# Patient Record
Sex: Female | Born: 1955 | Race: White | Hispanic: No | Smoking: Current every day smoker
Health system: Southern US, Community
[De-identification: ages and names within clinical notes are randomized; demographics above are authoritative.]

## PROBLEM LIST (undated history)

## (undated) DIAGNOSIS — F32A Depression, unspecified: Secondary | ICD-10-CM

## (undated) DIAGNOSIS — D696 Thrombocytopenia, unspecified: Secondary | ICD-10-CM

## (undated) DIAGNOSIS — R519 Headache, unspecified: Secondary | ICD-10-CM

## (undated) DIAGNOSIS — F259 Schizoaffective disorder, unspecified: Secondary | ICD-10-CM

## (undated) DIAGNOSIS — F319 Bipolar disorder, unspecified: Secondary | ICD-10-CM

## (undated) DIAGNOSIS — M1990 Unspecified osteoarthritis, unspecified site: Secondary | ICD-10-CM

## (undated) DIAGNOSIS — J449 Chronic obstructive pulmonary disease, unspecified: Secondary | ICD-10-CM

## (undated) DIAGNOSIS — Z780 Asymptomatic menopausal state: Secondary | ICD-10-CM

## (undated) DIAGNOSIS — K219 Gastro-esophageal reflux disease without esophagitis: Secondary | ICD-10-CM

## (undated) DIAGNOSIS — F419 Anxiety disorder, unspecified: Secondary | ICD-10-CM

## (undated) DIAGNOSIS — B1920 Unspecified viral hepatitis C without hepatic coma: Secondary | ICD-10-CM

## (undated) DIAGNOSIS — I10 Essential (primary) hypertension: Secondary | ICD-10-CM

## (undated) DIAGNOSIS — F329 Major depressive disorder, single episode, unspecified: Secondary | ICD-10-CM

## (undated) DIAGNOSIS — A419 Sepsis, unspecified organism: Secondary | ICD-10-CM

## (undated) HISTORY — DX: Unspecified viral hepatitis C without hepatic coma: B19.20

## (undated) HISTORY — DX: Asymptomatic menopausal state: Z78.0

## (undated) HISTORY — DX: Anxiety disorder, unspecified: F41.9

## (undated) HISTORY — DX: Major depressive disorder, single episode, unspecified: F32.9

## (undated) HISTORY — DX: Gastro-esophageal reflux disease without esophagitis: K21.9

## (undated) HISTORY — DX: Depression, unspecified: F32.A

---

## 2005-04-25 ENCOUNTER — Emergency Department: Payer: Self-pay | Admitting: Emergency Medicine

## 2005-08-14 ENCOUNTER — Emergency Department: Payer: Self-pay | Admitting: General Practice

## 2005-12-01 ENCOUNTER — Emergency Department: Payer: Self-pay | Admitting: Emergency Medicine

## 2007-08-28 ENCOUNTER — Inpatient Hospital Stay: Payer: Self-pay | Admitting: Internal Medicine

## 2008-11-19 HISTORY — PX: CATARACT EXTRACTION: SUR2

## 2009-09-08 ENCOUNTER — Emergency Department: Payer: Self-pay | Admitting: Unknown Physician Specialty

## 2009-12-19 ENCOUNTER — Inpatient Hospital Stay: Payer: Self-pay | Admitting: Unknown Physician Specialty

## 2010-04-03 ENCOUNTER — Ambulatory Visit (HOSPITAL_COMMUNITY): Admission: RE | Admit: 2010-04-03 | Discharge: 2010-04-03 | Payer: Self-pay | Admitting: Ophthalmology

## 2010-05-05 ENCOUNTER — Inpatient Hospital Stay: Payer: Self-pay | Admitting: Psychiatry

## 2010-07-24 ENCOUNTER — Emergency Department: Payer: Self-pay | Admitting: Emergency Medicine

## 2011-02-06 LAB — BASIC METABOLIC PANEL
CO2: 26 mEq/L (ref 19–32)
Calcium: 9.6 mg/dL (ref 8.4–10.5)
Chloride: 109 mEq/L (ref 96–112)
Creatinine, Ser: 0.76 mg/dL (ref 0.4–1.2)
Glucose, Bld: 113 mg/dL — ABNORMAL HIGH (ref 70–99)
Sodium: 142 mEq/L (ref 135–145)

## 2011-07-10 ENCOUNTER — Emergency Department: Payer: Self-pay | Admitting: Emergency Medicine

## 2012-10-27 ENCOUNTER — Emergency Department: Payer: Self-pay | Admitting: Emergency Medicine

## 2012-12-30 ENCOUNTER — Encounter: Payer: Self-pay | Admitting: Nurse Practitioner

## 2013-01-17 ENCOUNTER — Encounter: Payer: Self-pay | Admitting: Nurse Practitioner

## 2013-07-14 ENCOUNTER — Ambulatory Visit: Payer: Self-pay | Admitting: Cardiovascular Disease

## 2013-07-14 LAB — CBC WITH DIFFERENTIAL/PLATELET
Basophil #: 0.2 10*3/uL — ABNORMAL HIGH (ref 0.0–0.1)
Basophil %: 1.4 %
Eosinophil %: 1.6 %
HCT: 41.9 % (ref 35.0–47.0)
Lymphocyte #: 4 10*3/uL — ABNORMAL HIGH (ref 1.0–3.6)
Lymphocyte %: 29.2 %
MCHC: 34.5 g/dL (ref 32.0–36.0)
MCV: 95 fL (ref 80–100)
Monocyte #: 1.1 x10 3/mm — ABNORMAL HIGH (ref 0.2–0.9)
Neutrophil %: 59.6 %
RBC: 4.4 10*6/uL (ref 3.80–5.20)
RDW: 13.2 % (ref 11.5–14.5)
WBC: 13.7 10*3/uL — ABNORMAL HIGH (ref 3.6–11.0)

## 2013-07-15 ENCOUNTER — Ambulatory Visit: Payer: Self-pay | Admitting: Cardiovascular Disease

## 2013-07-16 ENCOUNTER — Inpatient Hospital Stay: Payer: Self-pay | Admitting: Psychiatry

## 2013-07-16 LAB — COMPREHENSIVE METABOLIC PANEL
Albumin: 3.3 g/dL — ABNORMAL LOW (ref 3.4–5.0)
Alkaline Phosphatase: 72 U/L (ref 50–136)
Anion Gap: 9 (ref 7–16)
BUN: 8 mg/dL (ref 7–18)
Calcium, Total: 7.9 mg/dL — ABNORMAL LOW (ref 8.5–10.1)
Chloride: 108 mmol/L — ABNORMAL HIGH (ref 98–107)
Glucose: 96 mg/dL (ref 65–99)
Osmolality: 272 (ref 275–301)
Potassium: 3.3 mmol/L — ABNORMAL LOW (ref 3.5–5.1)
SGOT(AST): 19 U/L (ref 15–37)
SGPT (ALT): 17 U/L (ref 12–78)
Sodium: 137 mmol/L (ref 136–145)
Total Protein: 6.1 g/dL — ABNORMAL LOW (ref 6.4–8.2)

## 2013-07-16 LAB — URINALYSIS, COMPLETE
Bacteria: NONE SEEN
Bilirubin,UR: NEGATIVE
Bilirubin,UR: NEGATIVE
Blood: NEGATIVE
Blood: NEGATIVE
Glucose,UR: NEGATIVE mg/dL (ref 0–75)
Leukocyte Esterase: NEGATIVE
Nitrite: NEGATIVE
Ph: 6 (ref 4.5–8.0)
Ph: 6 (ref 4.5–8.0)
Protein: NEGATIVE
RBC,UR: <1 /HPF (ref 0–5)
RBC,UR: 12 /HPF (ref 0–5)
Specific Gravity: 1.009 (ref 1.003–1.030)
Specific Gravity: 1.016 (ref 1.003–1.030)
Squamous Epithelial: 1
Squamous Epithelial: 6
WBC UR: 2 /HPF (ref 0–5)
WBC UR: 37 /HPF (ref 0–5)

## 2013-07-16 LAB — CBC WITH DIFFERENTIAL/PLATELET
Basophil %: 0.6 %
Eosinophil #: 0.2 10*3/uL (ref 0.0–0.7)
Eosinophil %: 1.2 %
HCT: 37.3 % (ref 35.0–47.0)
HGB: 12.8 g/dL (ref 12.0–16.0)
Lymphocyte #: 3.1 10*3/uL (ref 1.0–3.6)
Lymphocyte %: 23.7 %
MCV: 96 fL (ref 80–100)
Monocyte #: 0.9 x10 3/mm (ref 0.2–0.9)
Neutrophil %: 67.5 %
Platelet: 193 10*3/uL (ref 150–440)
RDW: 13.2 % (ref 11.5–14.5)

## 2013-07-16 LAB — DRUG SCREEN, URINE
Barbiturates, Ur Screen: NEGATIVE (ref ?–200)
Cannabinoid 50 Ng, Ur ~~LOC~~: NEGATIVE (ref ?–50)
Opiate, Ur Screen: NEGATIVE (ref ?–300)
Phencyclidine (PCP) Ur S: NEGATIVE (ref ?–25)

## 2013-07-16 LAB — CK TOTAL AND CKMB (NOT AT ARMC)
CK, Total: 55 U/L (ref 21–215)
CK-MB: 0.8 ng/mL (ref 0.5–3.6)

## 2013-07-16 LAB — TROPONIN I
Troponin-I: <0.02 ng/mL
Troponin-I: <0.02 ng/mL

## 2013-07-16 LAB — ETHANOL: Ethanol %: 0.23 % — ABNORMAL HIGH (ref 0.000–0.080)

## 2013-07-16 LAB — PROTIME-INR
INR: 1
Prothrombin Time: 13.1 secs (ref 11.5–14.7)

## 2013-07-24 ENCOUNTER — Emergency Department: Payer: Self-pay | Admitting: Emergency Medicine

## 2013-07-24 LAB — DRUG SCREEN, URINE
Amphetamines, Ur Screen: NEGATIVE (ref ?–1000)
Barbiturates, Ur Screen: NEGATIVE (ref ?–200)
Benzodiazepine, Ur Scrn: POSITIVE (ref ?–200)
Cannabinoid 50 Ng, Ur ~~LOC~~: NEGATIVE (ref ?–50)
MDMA (Ecstasy)Ur Screen: NEGATIVE (ref ?–500)
Methadone, Ur Screen: NEGATIVE (ref ?–300)
Phencyclidine (PCP) Ur S: NEGATIVE (ref ?–25)
Tricyclic, Ur Screen: NEGATIVE (ref ?–1000)

## 2013-07-24 LAB — CBC
HCT: 44 % (ref 35.0–47.0)
HGB: 15.3 g/dL (ref 12.0–16.0)
MCHC: 34.8 g/dL (ref 32.0–36.0)
MCV: 95 fL (ref 80–100)
WBC: 10.7 10*3/uL (ref 3.6–11.0)

## 2013-07-24 LAB — COMPREHENSIVE METABOLIC PANEL
Alkaline Phosphatase: 87 U/L (ref 50–136)
BUN: 7 mg/dL (ref 7–18)
Bilirubin,Total: 0.2 mg/dL (ref 0.2–1.0)
Calcium, Total: 8.7 mg/dL (ref 8.5–10.1)
Potassium: 3.7 mmol/L (ref 3.5–5.1)
SGOT(AST): 19 U/L (ref 15–37)
Sodium: 140 mmol/L (ref 136–145)
Total Protein: 7.5 g/dL (ref 6.4–8.2)

## 2013-07-24 LAB — ETHANOL: Ethanol: 282 mg/dL

## 2013-07-24 LAB — ACETAMINOPHEN LEVEL: Acetaminophen: <2 ug/mL

## 2013-08-31 ENCOUNTER — Ambulatory Visit: Payer: Self-pay | Admitting: Vascular Surgery

## 2013-08-31 HISTORY — PX: LOWER EXTREMITY ANGIOGRAPHY: CATH118251

## 2013-08-31 LAB — PROTIME-INR: Prothrombin Time: 28.9 secs — ABNORMAL HIGH (ref 11.5–14.7)

## 2013-08-31 LAB — BASIC METABOLIC PANEL
BUN: 11 mg/dL (ref 7–18)
Calcium, Total: 9.1 mg/dL (ref 8.5–10.1)
Chloride: 110 mmol/L — ABNORMAL HIGH (ref 98–107)
EGFR (African American): >60
EGFR (Non-African Amer.): >60
Glucose: 90 mg/dL (ref 65–99)
Osmolality: 280 (ref 275–301)
Potassium: 3.9 mmol/L (ref 3.5–5.1)
Sodium: 141 mmol/L (ref 136–145)

## 2013-10-26 LAB — COMPREHENSIVE METABOLIC PANEL
Albumin: 3.8 g/dL (ref 3.4–5.0)
BUN: 11 mg/dL (ref 7–18)
Bilirubin,Total: 0.3 mg/dL (ref 0.2–1.0)
Calcium, Total: 8.7 mg/dL (ref 8.5–10.1)
Chloride: 105 mmol/L (ref 98–107)
Creatinine: 0.62 mg/dL (ref 0.60–1.30)
EGFR (African American): >60
EGFR (Non-African Amer.): >60
Potassium: 3.4 mmol/L — ABNORMAL LOW (ref 3.5–5.1)
SGPT (ALT): 33 U/L (ref 12–78)
Sodium: 135 mmol/L — ABNORMAL LOW (ref 136–145)
Total Protein: 7.5 g/dL (ref 6.4–8.2)

## 2013-10-26 LAB — DRUG SCREEN, URINE
Amphetamines, Ur Screen: NEGATIVE (ref ?–1000)
Benzodiazepine, Ur Scrn: POSITIVE (ref ?–200)
Cocaine Metabolite,Ur ~~LOC~~: NEGATIVE (ref ?–300)
MDMA (Ecstasy)Ur Screen: NEGATIVE (ref ?–500)
Opiate, Ur Screen: NEGATIVE (ref ?–300)
Tricyclic, Ur Screen: NEGATIVE (ref ?–1000)

## 2013-10-26 LAB — URINALYSIS, COMPLETE
Glucose,UR: NEGATIVE mg/dL (ref 0–75)
Ketone: NEGATIVE
Leukocyte Esterase: NEGATIVE
Ph: 5 (ref 4.5–8.0)
RBC,UR: 3 /HPF (ref 0–5)
Specific Gravity: 1.004 (ref 1.003–1.030)
Squamous Epithelial: 1

## 2013-10-26 LAB — CBC
HCT: 39.1 % (ref 35.0–47.0)
HGB: 13.2 g/dL (ref 12.0–16.0)
MCH: 31.8 pg (ref 26.0–34.0)
MCV: 94 fL (ref 80–100)
Platelet: 211 10*3/uL (ref 150–440)
RDW: 13.8 % (ref 11.5–14.5)

## 2013-10-26 LAB — SALICYLATE LEVEL: Salicylates, Serum: 3.3 mg/dL — ABNORMAL HIGH

## 2013-10-26 LAB — ETHANOL: Ethanol %: <0.003 % (ref 0.000–0.080)

## 2013-10-26 LAB — ACETAMINOPHEN LEVEL: Acetaminophen: <2 ug/mL

## 2013-10-27 ENCOUNTER — Inpatient Hospital Stay: Payer: Self-pay | Admitting: Psychiatry

## 2013-10-27 ENCOUNTER — Inpatient Hospital Stay: Payer: Self-pay | Admitting: Internal Medicine

## 2013-10-27 LAB — PROTIME-INR
INR: >15.1
Prothrombin Time: >120 secs (ref 11.5–14.7)

## 2013-10-28 LAB — CBC WITH DIFFERENTIAL/PLATELET
Basophil %: 0.7 %
Eosinophil #: 0.1 10*3/uL (ref 0.0–0.7)
Eosinophil %: 0.5 %
HCT: 27.2 % — ABNORMAL LOW (ref 35.0–47.0)
HGB: 9.2 g/dL — ABNORMAL LOW (ref 12.0–16.0)
Lymphocyte #: 1.3 10*3/uL (ref 1.0–3.6)
MCH: 32 pg (ref 26.0–34.0)
MCHC: 33.7 g/dL (ref 32.0–36.0)
Monocyte #: 0.8 x10 3/mm (ref 0.2–0.9)
Monocyte %: 5.7 %
Neutrophil #: 11.7 10*3/uL — ABNORMAL HIGH (ref 1.4–6.5)
Neutrophil %: 83.9 %
RBC: 2.86 10*6/uL — ABNORMAL LOW (ref 3.80–5.20)
RDW: 14.3 % (ref 11.5–14.5)
WBC: 14 10*3/uL — ABNORMAL HIGH (ref 3.6–11.0)

## 2013-10-28 LAB — BASIC METABOLIC PANEL
Anion Gap: 4 — ABNORMAL LOW (ref 7–16)
BUN: 7 mg/dL (ref 7–18)
Calcium, Total: 8.1 mg/dL — ABNORMAL LOW (ref 8.5–10.1)
Chloride: 107 mmol/L (ref 98–107)
Co2: 27 mmol/L (ref 21–32)
Creatinine: 0.5 mg/dL — ABNORMAL LOW (ref 0.60–1.30)
EGFR (African American): >60
EGFR (Non-African Amer.): >60
Glucose: 104 mg/dL — ABNORMAL HIGH (ref 65–99)
Osmolality: 274 (ref 275–301)
Potassium: 3.8 mmol/L (ref 3.5–5.1)
Sodium: 138 mmol/L (ref 136–145)

## 2013-10-28 LAB — URINALYSIS, COMPLETE
Bilirubin,UR: NEGATIVE
Hyaline Cast: 2
Nitrite: NEGATIVE
Protein: 30
Squamous Epithelial: 1
WBC UR: 11 /HPF (ref 0–5)

## 2013-10-28 LAB — MAGNESIUM: Magnesium: 1.2 mg/dL — ABNORMAL LOW

## 2013-10-28 LAB — PROTIME-INR
INR: >15.1
Prothrombin Time: >120 secs (ref 11.5–14.7)

## 2013-10-28 LAB — PRO B NATRIURETIC PEPTIDE: B-Type Natriuretic Peptide: 3001 pg/mL — ABNORMAL HIGH (ref 0–125)

## 2013-10-29 LAB — URINALYSIS, COMPLETE
Bacteria: NONE SEEN
Glucose,UR: NEGATIVE mg/dL (ref 0–75)
Leukocyte Esterase: NEGATIVE
RBC,UR: 1 /HPF (ref 0–5)
WBC UR: <1 /HPF (ref 0–5)

## 2013-10-29 LAB — PROTIME-INR
INR: 3.3
Prothrombin Time: 32.3 secs — ABNORMAL HIGH (ref 11.5–14.7)

## 2013-10-30 LAB — CBC WITH DIFFERENTIAL/PLATELET
Basophil #: 0 10*3/uL (ref 0.0–0.1)
Basophil %: 0.2 %
Eosinophil #: 0 10*3/uL (ref 0.0–0.7)
HCT: 26.6 % — ABNORMAL LOW (ref 35.0–47.0)
Lymphocyte #: 0.6 10*3/uL — ABNORMAL LOW (ref 1.0–3.6)
MCH: 31.4 pg (ref 26.0–34.0)
MCHC: 33.8 g/dL (ref 32.0–36.0)
Monocyte #: 0.9 x10 3/mm (ref 0.2–0.9)
Monocyte %: 6.1 %
Neutrophil #: 13.7 10*3/uL — ABNORMAL HIGH (ref 1.4–6.5)
Neutrophil %: 89.5 %
Platelet: 169 10*3/uL (ref 150–440)
RDW: 14 % (ref 11.5–14.5)
WBC: 15.3 10*3/uL — ABNORMAL HIGH (ref 3.6–11.0)

## 2013-10-30 LAB — BASIC METABOLIC PANEL
Calcium, Total: 8.7 mg/dL (ref 8.5–10.1)
Co2: 32 mmol/L (ref 21–32)
Creatinine: 0.55 mg/dL — ABNORMAL LOW (ref 0.60–1.30)
EGFR (African American): >60
Glucose: 126 mg/dL — ABNORMAL HIGH (ref 65–99)
Osmolality: 278 (ref 275–301)
Potassium: 2.3 mmol/L — CL (ref 3.5–5.1)
Sodium: 140 mmol/L (ref 136–145)

## 2013-10-30 LAB — POTASSIUM: Potassium: 2.7 mmol/L — ABNORMAL LOW (ref 3.5–5.1)

## 2013-10-30 LAB — MAGNESIUM: Magnesium: 1.5 mg/dL — ABNORMAL LOW

## 2013-10-31 LAB — BASIC METABOLIC PANEL
Anion Gap: 4 — ABNORMAL LOW (ref 7–16)
BUN: 10 mg/dL (ref 7–18)
Calcium, Total: 8.6 mg/dL (ref 8.5–10.1)
Chloride: 91 mmol/L — ABNORMAL LOW (ref 98–107)
Co2: 37 mmol/L — ABNORMAL HIGH (ref 21–32)
Creatinine: 0.57 mg/dL — ABNORMAL LOW (ref 0.60–1.30)
Glucose: 151 mg/dL — ABNORMAL HIGH (ref 65–99)
Sodium: 132 mmol/L — ABNORMAL LOW (ref 136–145)

## 2013-10-31 LAB — MAGNESIUM: Magnesium: 1.6 mg/dL — ABNORMAL LOW

## 2013-11-01 LAB — BASIC METABOLIC PANEL
Anion Gap: 7 (ref 7–16)
BUN: 16 mg/dL (ref 7–18)
Calcium, Total: 8.9 mg/dL (ref 8.5–10.1)
Co2: 35 mmol/L — ABNORMAL HIGH (ref 21–32)
Creatinine: 0.59 mg/dL — ABNORMAL LOW (ref 0.60–1.30)
EGFR (African American): >60
EGFR (Non-African Amer.): >60
Osmolality: 268 (ref 275–301)
Potassium: 2.3 mmol/L — CL (ref 3.5–5.1)

## 2013-11-01 LAB — POTASSIUM: Potassium: 3 mmol/L — ABNORMAL LOW (ref 3.5–5.1)

## 2013-11-01 LAB — MAGNESIUM: Magnesium: 2.1 mg/dL

## 2013-11-02 LAB — BASIC METABOLIC PANEL
Anion Gap: 5 — ABNORMAL LOW (ref 7–16)
BUN: 23 mg/dL — ABNORMAL HIGH (ref 7–18)
Co2: 36 mmol/L — ABNORMAL HIGH (ref 21–32)
Creatinine: 0.49 mg/dL — ABNORMAL LOW (ref 0.60–1.30)
EGFR (African American): >60
EGFR (Non-African Amer.): >60
Osmolality: 289 (ref 275–301)

## 2013-11-02 LAB — CBC WITH DIFFERENTIAL/PLATELET
Basophil #: 0 10*3/uL (ref 0.0–0.1)
HCT: 30.5 % — ABNORMAL LOW (ref 35.0–47.0)
Lymphocyte %: 2.9 %
MCHC: 33.2 g/dL (ref 32.0–36.0)
MCV: 94 fL (ref 80–100)
Monocyte #: 1.1 x10 3/mm — ABNORMAL HIGH (ref 0.2–0.9)
Monocyte %: 7.6 %
Neutrophil #: 13.2 10*3/uL — ABNORMAL HIGH (ref 1.4–6.5)
Neutrophil %: 89.2 %
Platelet: 284 10*3/uL (ref 150–440)
RBC: 3.26 10*6/uL — ABNORMAL LOW (ref 3.80–5.20)
RDW: 14.6 % — ABNORMAL HIGH (ref 11.5–14.5)

## 2013-11-02 LAB — MAGNESIUM: Magnesium: 2.3 mg/dL

## 2013-11-03 LAB — BASIC METABOLIC PANEL
Co2: 37 mmol/L — ABNORMAL HIGH (ref 21–32)
Creatinine: 0.51 mg/dL — ABNORMAL LOW (ref 0.60–1.30)
Potassium: 2.6 mmol/L — ABNORMAL LOW (ref 3.5–5.1)

## 2013-11-03 LAB — POTASSIUM: Potassium: 2.7 mmol/L — ABNORMAL LOW (ref 3.5–5.1)

## 2013-11-04 ENCOUNTER — Ambulatory Visit (HOSPITAL_COMMUNITY)
Admission: AD | Admit: 2013-11-04 | Discharge: 2013-11-04 | Disposition: A | Discharge status: Home / Self Care | Payer: Medicare Other | Source: Other Acute Inpatient Hospital | Attending: Internal Medicine | Admitting: Internal Medicine

## 2013-11-04 ENCOUNTER — Inpatient Hospital Stay
Admission: EM | Admit: 2013-11-04 | Discharge: 2013-11-13 | Disposition: A | Discharge status: Home Health | Payer: Medicare Other | Source: Other Acute Inpatient Hospital | Attending: Internal Medicine | Admitting: Internal Medicine

## 2013-11-04 DIAGNOSIS — J9600 Acute respiratory failure, unspecified whether with hypoxia or hypercapnia: Secondary | ICD-10-CM | POA: Insufficient documentation

## 2013-11-04 DIAGNOSIS — R45851 Suicidal ideations: Secondary | ICD-10-CM | POA: Insufficient documentation

## 2013-11-04 LAB — BLOOD GAS, ARTERIAL
Bicarbonate: 31.9 mEq/L — ABNORMAL HIGH (ref 20.0–24.0)
Patient temperature: 98.7
TCO2: 33.1 mmol/L (ref 0–100)
pCO2 arterial: 38.7 mmHg (ref 35.0–45.0)
pH, Arterial: 7.527 — ABNORMAL HIGH (ref 7.350–7.450)

## 2013-11-04 LAB — BASIC METABOLIC PANEL
Anion Gap: 5 — ABNORMAL LOW (ref 7–16)
BUN: 23 mg/dL — ABNORMAL HIGH (ref 7–18)
Calcium, Total: 9 mg/dL (ref 8.5–10.1)
Chloride: 94 mmol/L — ABNORMAL LOW (ref 98–107)
Co2: 37 mmol/L — ABNORMAL HIGH (ref 21–32)
Glucose: 175 mg/dL — ABNORMAL HIGH (ref 65–99)
Osmolality: 280 (ref 275–301)

## 2013-11-04 LAB — CBC WITH DIFFERENTIAL/PLATELET
Basophil #: 0 10*3/uL (ref 0.0–0.1)
Eosinophil %: 0 %
Lymphocyte #: 0.8 10*3/uL — ABNORMAL LOW (ref 1.0–3.6)
MCHC: 33.4 g/dL (ref 32.0–36.0)
MCV: 92 fL (ref 80–100)
Monocyte %: 6.3 %
Neutrophil %: 87.7 %
RBC: 3.1 10*6/uL — ABNORMAL LOW (ref 3.80–5.20)
RDW: 14.7 % — ABNORMAL HIGH (ref 11.5–14.5)
WBC: 13 10*3/uL — ABNORMAL HIGH (ref 3.6–11.0)

## 2013-11-05 ENCOUNTER — Other Ambulatory Visit (HOSPITAL_COMMUNITY): Payer: Medicare Other

## 2013-11-05 LAB — CBC WITH DIFFERENTIAL/PLATELET
Basophils Absolute: 0 10*3/uL (ref 0.0–0.1)
Eosinophils Relative: 0 % (ref 0–5)
HCT: 30.2 % — ABNORMAL LOW (ref 36.0–46.0)
Hemoglobin: 10.1 g/dL — ABNORMAL LOW (ref 12.0–15.0)
Lymphocytes Relative: 5 % — ABNORMAL LOW (ref 12–46)
MCV: 92.9 fL (ref 78.0–100.0)
Monocytes Absolute: 1.1 10*3/uL — ABNORMAL HIGH (ref 0.1–1.0)
Monocytes Relative: 8 % (ref 3–12)
RDW: 15 % (ref 11.5–15.5)
WBC: 13.8 10*3/uL — ABNORMAL HIGH (ref 4.0–10.5)

## 2013-11-05 LAB — COMPREHENSIVE METABOLIC PANEL
BUN: 21 mg/dL (ref 6–23)
CO2: 32 mEq/L (ref 19–32)
Calcium: 8.6 mg/dL (ref 8.4–10.5)
Chloride: 97 mEq/L (ref 96–112)
Creatinine, Ser: 0.46 mg/dL — ABNORMAL LOW (ref 0.50–1.10)
GFR calc Af Amer: >90 mL/min (ref >90)
GFR calc non Af Amer: >90 mL/min (ref >90)
Glucose, Bld: 162 mg/dL — ABNORMAL HIGH (ref 70–99)
Total Bilirubin: 0.7 mg/dL (ref 0.3–1.2)

## 2013-11-05 LAB — MAGNESIUM: Magnesium: 2.1 mg/dL (ref 1.5–2.5)

## 2013-11-05 LAB — PHOSPHORUS: Phosphorus: 3.6 mg/dL (ref 2.3–4.6)

## 2013-11-05 LAB — PREALBUMIN: Prealbumin: 27.8 mg/dL (ref 17.0–34.0)

## 2013-11-07 ENCOUNTER — Other Ambulatory Visit (HOSPITAL_COMMUNITY): Payer: Medicare Other

## 2013-11-07 LAB — URINALYSIS, ROUTINE W REFLEX MICROSCOPIC
Glucose, UA: NEGATIVE mg/dL
Ketones, ur: NEGATIVE mg/dL
Leukocytes, UA: NEGATIVE
Protein, ur: NEGATIVE mg/dL
Specific Gravity, Urine: 1.021 (ref 1.005–1.030)
Urobilinogen, UA: 1 mg/dL (ref 0.0–1.0)

## 2013-11-07 LAB — CBC
HCT: 30.6 % — ABNORMAL LOW (ref 36.0–46.0)
Platelets: 385 10*3/uL (ref 150–400)
RBC: 3.35 MIL/uL — ABNORMAL LOW (ref 3.87–5.11)
RDW: 14.8 % (ref 11.5–15.5)
WBC: 15.4 10*3/uL — ABNORMAL HIGH (ref 4.0–10.5)

## 2013-11-07 LAB — BASIC METABOLIC PANEL
Chloride: 95 mEq/L — ABNORMAL LOW (ref 96–112)
GFR calc Af Amer: >90 mL/min (ref >90)
GFR calc non Af Amer: >90 mL/min (ref >90)
Potassium: 3.1 mEq/L — ABNORMAL LOW (ref 3.5–5.1)
Sodium: 135 mEq/L (ref 135–145)

## 2013-11-07 LAB — PHOSPHORUS: Phosphorus: 4 mg/dL (ref 2.3–4.6)

## 2013-11-07 LAB — MAGNESIUM: Magnesium: 2.2 mg/dL (ref 1.5–2.5)

## 2013-11-08 LAB — CBC WITH DIFFERENTIAL/PLATELET
Basophils Relative: 0 % (ref 0–1)
Eosinophils Absolute: 0 10*3/uL (ref 0.0–0.7)
Hemoglobin: 10.4 g/dL — ABNORMAL LOW (ref 12.0–15.0)
Lymphocytes Relative: 8 % — ABNORMAL LOW (ref 12–46)
Lymphs Abs: 1.2 10*3/uL (ref 0.7–4.0)
MCH: 31.6 pg (ref 26.0–34.0)
MCV: 92.7 fL (ref 78.0–100.0)
Monocytes Relative: 4 % (ref 3–12)
Neutro Abs: 13.5 10*3/uL — ABNORMAL HIGH (ref 1.7–7.7)
Neutrophils Relative %: 88 % — ABNORMAL HIGH (ref 43–77)
Platelets: 351 10*3/uL (ref 150–400)
RBC: 3.29 MIL/uL — ABNORMAL LOW (ref 3.87–5.11)
WBC: 15.3 10*3/uL — ABNORMAL HIGH (ref 4.0–10.5)

## 2013-11-08 LAB — BASIC METABOLIC PANEL
BUN: 17 mg/dL (ref 6–23)
CO2: 29 mEq/L (ref 19–32)
GFR calc Af Amer: >90 mL/min (ref >90)
GFR calc non Af Amer: >90 mL/min (ref >90)
Glucose, Bld: 170 mg/dL — ABNORMAL HIGH (ref 70–99)
Potassium: 4 mEq/L (ref 3.5–5.1)
Sodium: 135 mEq/L (ref 135–145)

## 2013-11-09 LAB — URINE CULTURE

## 2013-11-09 LAB — CBC
HCT: 32 % — ABNORMAL LOW (ref 36.0–46.0)
Hemoglobin: 10.9 g/dL — ABNORMAL LOW (ref 12.0–15.0)
MCH: 31.6 pg (ref 26.0–34.0)
MCV: 92.8 fL (ref 78.0–100.0)
Platelets: 317 10*3/uL (ref 150–400)
RBC: 3.45 MIL/uL — ABNORMAL LOW (ref 3.87–5.11)
WBC: 14.4 10*3/uL — ABNORMAL HIGH (ref 4.0–10.5)

## 2013-11-09 LAB — BASIC METABOLIC PANEL
BUN: 16 mg/dL (ref 6–23)
CO2: 29 mEq/L (ref 19–32)
Calcium: 8.7 mg/dL (ref 8.4–10.5)
Chloride: 94 mEq/L — ABNORMAL LOW (ref 96–112)
Creatinine, Ser: 0.41 mg/dL — ABNORMAL LOW (ref 0.50–1.10)
Glucose, Bld: 189 mg/dL — ABNORMAL HIGH (ref 70–99)

## 2013-11-10 LAB — BASIC METABOLIC PANEL
BUN: 15 mg/dL (ref 6–23)
CO2: 31 mEq/L (ref 19–32)
Calcium: 8.9 mg/dL (ref 8.4–10.5)
Creatinine, Ser: 0.53 mg/dL (ref 0.50–1.10)
GFR calc non Af Amer: >90 mL/min (ref >90)
Glucose, Bld: 125 mg/dL — ABNORMAL HIGH (ref 70–99)
Sodium: 140 mEq/L (ref 135–145)

## 2015-01-20 ENCOUNTER — Emergency Department: Payer: Self-pay | Admitting: Emergency Medicine

## 2015-01-24 IMAGING — CR DG CHEST 1V PORT
1 series · 1 of 1 positions shown · non-contrast
Comparison: Chest radiograph July 24, 2013

CLINICAL DATA: Overdose and chest pain.

EXAM:
PORTABLE CHEST - 1 VIEW

[x chest ap]
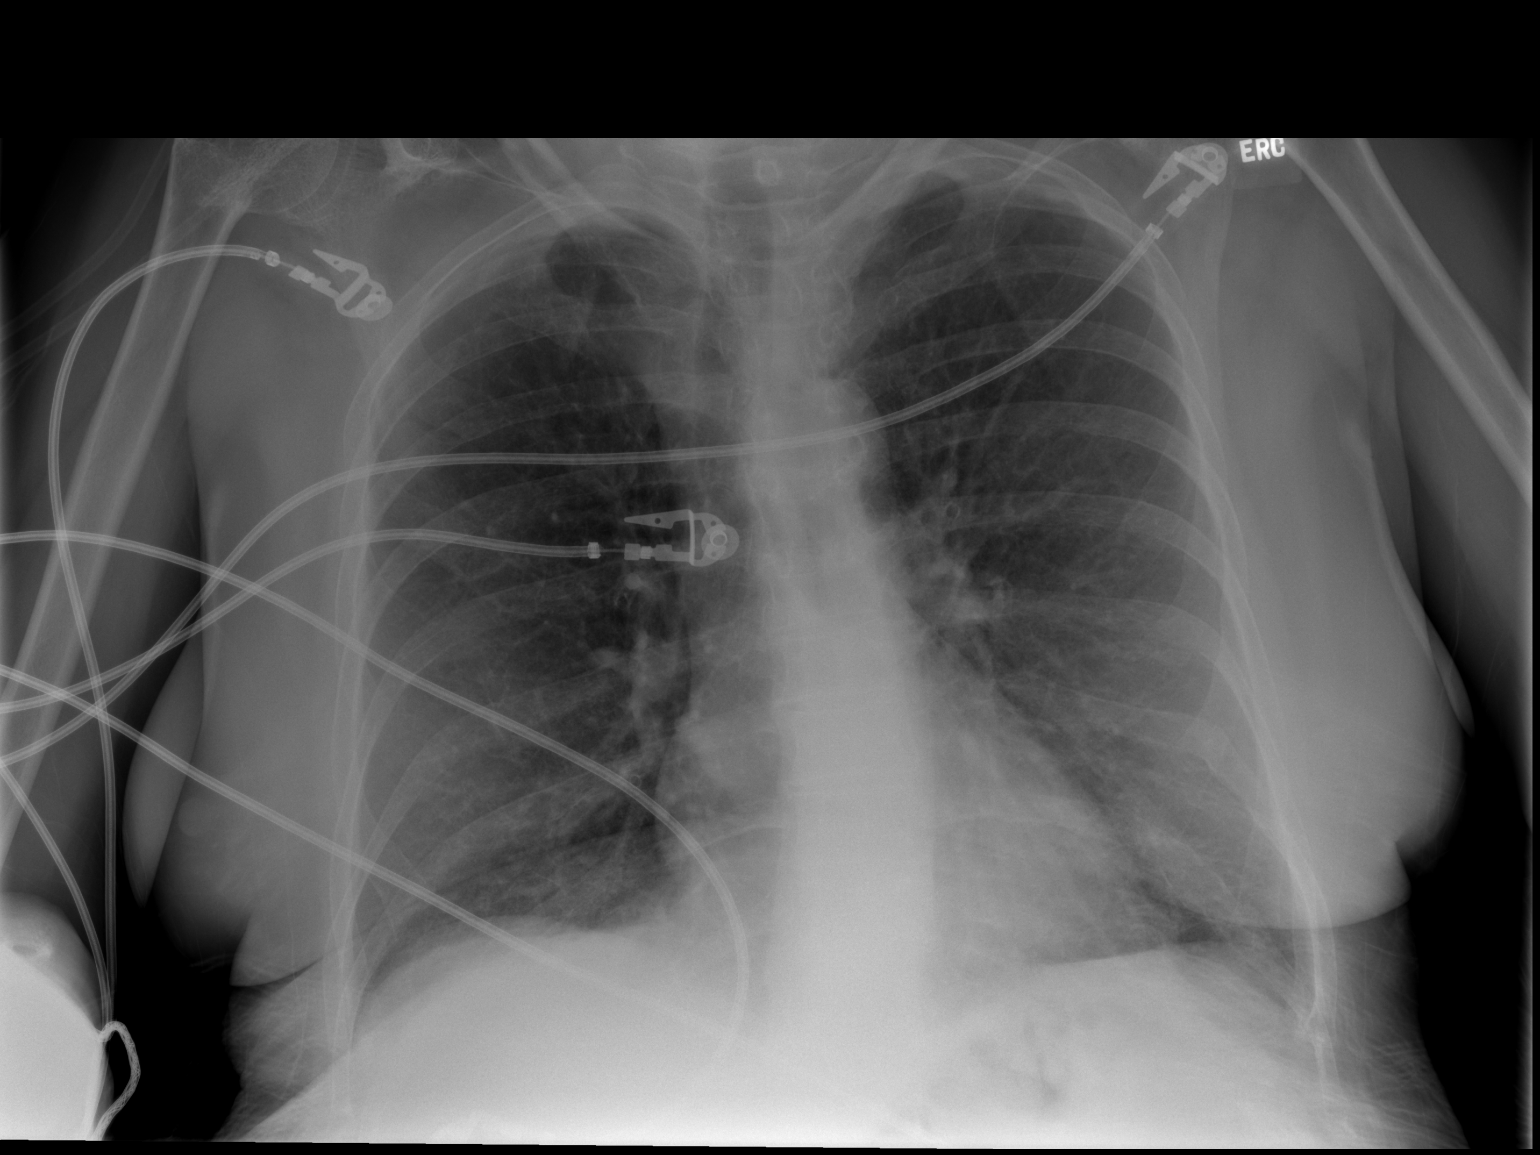

[1 of 1 positions shown; findings below may reference images not displayed]

FINDINGS: Cardiomediastinal silhouette is unremarkable. The lungs are clear
without pleural effusions or focal consolidations. Pulmonary
vasculature is unremarkable. Trachea projects midline and there is
no pneumothorax. Soft tissue planes and included osseous structures
are nonsuspicious. Multiple EKG lines overlie the patient and may
obscure subtle underlying pathology.
IMPRESSION: No acute cardiopulmonary process.

  By: Durga Stites

## 2015-03-11 NOTE — H&P (Signed)
PATIENT NAME:  Krystal Lee, Krystal Lee MR#:  [id number] DATE OF BIRTH:  Jan 31, 1956  DATE OF ADMISSION:  10/27/2013  REFERRING PHYSICIAN: Emergency Room MD   ATTENDING PHYSICIAN: Kristine Linea, M.D.   IDENTIFYING DATA: Krystal Lee is a 59 year old female with a history of mood instability and substance abuse.   CHIEF COMPLAINT: "I had a fight with my niece."   HISTORY OF PRESENT ILLNESS: Krystal Lee has a long history of substance abuse and mood instability. She has been a patient of Dr. Marguerite Olea and reports that she has been compliant with medications. She has been struggling with substance abuse and alcoholism. She reports that for the past 3 months, she has been staying with her family. She came to realize that all they want is her disability check. She got into an argument when they would not to allow her to bring any alcohol home and told her that her next drink of beer could be at Christmas. She reports that she went to a friend's house, who always has drugs, and used some pills. She gave a completely different story at the time of admission, when she described riding with someone in the car and police chasing them. She reported that she felt suicidal and overdosed as a suicide attempt. She has other suicide attempts by overdose and wrist cutting. It is questionable whether her overdoses were a true suicide attempt or accidental overdoses while using drugs. The patient denies any mood symptoms, psychosis, anxiety. She said that she was doing fine up until this argument with the family.   PAST PSYCHIATRIC HISTORY: The patient was hospitalized several times, mostly for substance abuse, in [place] in 1999, in [place] in 1980. She went through [facility] in 2011 and has been doing exceedingly well. She had several hospitalizations at [facility] as well. She has been tried on numerous medications: Saphris, Prozac, Wellbutrin, Zyprexa, Seroquel and Zoloft. She has a history of abuse of  benzodiazepines and methadone. She reports that Dr. Marguerite Olea currently prescribes low-dose Xanax.   FAMILY PSYCHIATRIC HISTORY: None reported.   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   MEDICATIONS ON ADMISSION: Zoloft 50 mg daily, Coumadin 4 mg at 5:00, Seroquel 150 mg in the evening, Advair Diskus twice daily, Crestor 20 mg daily, albuterol as needed.   ALLERGIES: No known drug allergies.   SOCIAL HISTORY: She is from [place]. There is no history of childhood abuse. She dropped out of school in the 11th grade. She has no GED. She worked in [employer]/video. She is a widow and has no children. She is on disability now and has Medicaid. She lives with her family but plans to obtain an independent apartment again. She is a smoker. She has a history of multiple arrests and incarcerations. She has a driver's license and no current legal charges.   REVIEW OF SYSTEMS: CONSTITUTIONAL: No fevers or chills. Positive for some weight loss.  EYES: No double or blurred vision.  ENT: No hearing loss.  RESPIRATORY: No shortness of breath or cough.  CARDIOVASCULAR: No chest pain or orthopnea.   GASTROINTESTINAL: No abdominal pain, nausea, vomiting or diarrhea.  GENITOURINARY: No incontinence or frequency.  ENDOCRINE: No heat or cold intolerance.  LYMPHATIC: No anemia or easy bruising.  INTEGUMENTARY: No acne or rash.  MUSCULOSKELETAL: No muscle or joint pain.  NEUROLOGIC: No tingling or weakness. See history of present illness for details.  PHYSICAL EXAMINATION: VITAL SIGNS: Blood pressure 141/70, pulse 116, respirations 20, temperature 98.9.  GENERAL: This is a slender female in no acute distress.  HEENT: The pupils are equal, round and reactive to light. Sclerae anicteric.  NECK: Supple. No thyromegaly.  LUNGS: Clear to auscultation. No dullness to percussion.  HEART:  Regular rhythm and rate. No  murmurs, rubs or gallops.  ABDOMEN: Soft, nontender, nondistended. Positive bowel sounds.  MUSCULOSKELETAL: Normal muscle strength in all extremities.  SKIN: No rashes or bruises.  LYMPHATIC: No cervical adenopathy.  NEUROLOGIC: Cranial nerves II through XII are intact.   LABORATORY, DIAGNOSTIC AND RADIOLOGICAL  DATA: Chemistries are within normal limits except for sodium of 135, potassium 3.4. Blood alcohol level zero. LFTs within normal limits except for AST of 38. Urine tox screen is positive for benzodiazepines and methadone. CBC within normal limits except white blood count of 14.1. Urinalysis is not suggestive of urinary tract infection. Serum acetaminophen and salicylates are low. EKG: Sinus tachycardia, otherwise normal EKG. Chest x-ray: No abnormal findings.   MENTAL STATUS EXAMINATION ON ADMISSION: The patient is alert and oriented to person, place, time and situation. She is pleasant, polite and cooperative. She is marginally groomed, appears very tired. She wears hospital scrubs. She maintains good eye contact. She recognizes me from previous admission. Her speech is of normal rhythm, rate and volume. Mood is depressed with flat affect. Thought process is logical and goal oriented. Thought content: She denies suicidal or homicidal ideation. There are no delusions or paranoia. There are no auditory or visual hallucinations. Her cognition is grossly intact. She registers 3 out of 3 and recalls 3 out of 3 objects after 5 minutes. She can spell 'world' forward and backward. She knows the current president. Her insight and judgment are questionable.   SUICIDE RISK ASSESSMENT ON ADMISSION: This is a patient with a long history of mood instability and substance abuse, who came to the hospital after presumed overdose in the context of a family conflict. She is at increased risk of suicide.   INITIAL DIAGNOSES:  AXIS I:  Bipolar disorder, not otherwise specified; benzodiazepine abuse, opiate abuse.   AXIS II: Deferred.  AXIS III: Hypertension; hepatitis; aortic stenosis; chronic obstructive pulmonary disease.  AXIS IV: Mental illness; substance abuse; family conflict.  AXIS V: Global assessment of functioning on admission: 25.   PLAN: The patient was admitted to [facility] behavioral medicine unit for safety, stabilization and medication management. She was initially placed on suicide precautions and was closely monitored for any unsafe behaviors. She underwent full psychiatric and risk assessment. She received pharmacotherapy, individual and group psychotherapy, substance abuse counseling and support from therapeutic milieu.  1. Suicidal ideation. The patient is able to contract for safety. She admitted to intentional overdose when upset with the family.  2. Alcohol and benzodiazepine abuse. The patient has a history of alcoholism. She denies any recent use. She reports that she has been using her Xanax as prescribed by Dr. Marguerite OleaMoffett. We will put her on Ativan taper.  3. Opiate abuse. Several times during hospitalizations here, the patient was positive for methadone. Apparently, she has a friend or a neighbor who provides methadone for her. She had to be kept in the Emergency Room for 24 hours in order to be admitted here, since she was obtunded and had difficulties breathing. We will offer symptomatic treatment should symptoms of opiate withdrawal arise.  4. Mood. We will continue Seroquel and Zoloft as prescribed by Dr. Marguerite OleaMoffett. We  will not offer any Xanax.  5. Medical. We will continue all other medications including Coumadin.  6. Substance abuse treatment. The patient did exceedingly well after she completed treatment at [facility]. She, at this point, declines; will continue negotiations.  7. Disposition. She will be discharged back with her family where there is conflict, as she only has $33 left from her paycheck. She gave all her money to her house.    ____________________________ Ellin Goodie. Jennet Maduro, MD jbp:cs D: 10/27/2013 13:42:00 ET T: 10/27/2013 14:10:43 ET JOB#: [id number]  cc: Ziare Orrick B. Jennet Maduro, MD, <Dictator>   Shari Prows MD ELECTRONICALLY SIGNED 11/17/2013 4:20

## 2015-03-11 NOTE — Discharge Summary (Signed)
PATIENT NAME:  Krystal Lee, Krystal Lee MR#:  [id number] DATE OF BIRTH:  17-Dec-1955  DATE OF ADMISSION:  10/27/2013 DATE OF DISCHARGE:  11/04/2013  DISCHARGING PHYSICIAN: Marland McalpineSheikh A. Ellsworth Lennoxejan-Sie, MD  DISCHARGE DIAGNOSES:  1.  Acute respiratory failure, community-acquired pneumonia, respiratory failure, acute diastolic congestive heart failure.  2.  Recurrent hypokalemia.  3.  Depression.   CONSULTATIONS: Pulmonology, Dr. Ned ClinesHerbon Fleming.   PROCEDURES: None.   IMAGING:  1.  2-D echocardiogram: Normal ejection fraction with abnormal diastolic filling.  2.  Chest x-ray: Infiltrates suggestive of pneumonia.   HOSPITAL COURSE: This middle-aged lady was transferred to medical service after initially being admitted to behavioral medicine for management of her severe depression. She was found to be tachypneic, tachycardic, and hypoxemic. Initial evaluation for pulmonary embolism was negative. However, due to the patient'Krystal Lee status, she was transferred to medicine service under my care. At time of admission, the patient was also febrile with a marked leukocytosis. She had clinical features consistent with systemic inflammatory response syndrome and acute hypoxic respiratory failure. Her INR was also supratherapeutic. It was suspected that she had overdosed on various medications. Following transfer to medicine, the patient'Krystal Lee respiratory requirements remained quite high. Her oxygen requirements continued to rise the initial few days. I ordered a BNP, which was 3000. Her echocardiogram revealed findings consistent with diastolic dysfunction. After initiation of antibiotics, the patient did not improve much and I added intravenous Lasix based on her BNP and echo findings, with gradual improvement in her oxygen. The patient was on high-flow nasal cannula, initially 90% oxygen, which was gradually weaned down to 60% after 3 days of Lasix. I consulted pulmonology service, Dr. Ned ClinesHerbon Fleming, who determined the patient probably  had some baseline COPD due to history of smoking and added intravenous steroids, which also contributed to her improvement. At the time of discharge, the patient was down to 4 liters nasal cannula, had oxygen saturation of 94%. The patient'Krystal Lee hospital stay was also complicated by recurrent hypokalemia. She has been receiving about 100 mEq of potassium daily. Her magnesium was corrected to noormal yesterday. I also reduced her Lasix to 40 mg IV once a day. The patient had been transferred on methylprednisolone; however, pulmonary recommendations are that she should start her prednisone taper as soon as possible.   The patient was followed by Dr. Jennet MaduroPucilowska for her depression. She did not make any change in her medications.   I recommend resumption of the patient'Ed Mandich long-term anticoagulation for history of venous thromboembolism. [facility] satisfactory condition.    DISCHARGE MEDICATIONS: Please refer to medical reconciliation.   INSTRUCTIONS:  DIET: Low-sodium.  ACTIVITY: As tolerated.  FOLLOWUP: With Dr. Yves DillNeelam Khan following [facility].  DISCHARGE PROCESS TIME SPENT: 35 minutes.   ____________________________ Silas FloodSheikh A. Ellsworth Lennoxejan-Sie, MD sat:jcm D: 11/04/2013 13:41:19 ET T: 11/04/2013 20:23:07 ET JOB#: [id number]  cc: Sheikh A. Ellsworth Lennoxejan-Sie, MD, <Dictator> Charlesetta GaribaldiSHEIKH A TEJAN-SIE MD ELECTRONICALLY SIGNED 11/06/2013 13:19

## 2015-03-11 NOTE — Op Note (Signed)
PATIENT NAME:  Krystal Lee, Krystal Lee MR#:  [id number] DATE OF BIRTH:  Jan 09, 1956  DATE OF PROCEDURE:  08/31/2013  PREOPERATIVE DIAGNOSES:  1.  Peripheral arterial disease with claudication, bilateral lower extremities.  2.  Tobacco dependence.  3.  Heart disease.  4.  Hypertension.   POSTOPERATIVE DIAGNOSES:  1.  Peripheral arterial disease with claudication, bilateral lower extremities.  2.  Tobacco dependence.  3.  Heart disease.  4.  Hypertension.   PROCEDURES:  1.  Ultrasound guidance for vascular access to bilateral femoral arteries.  2.  Aortogram and iliofemoral arteriogram.  3.  Percutaneous transluminal angioplasty of left common and external iliac arteries.  4.  StarClose closure device bilateral femoral artery.   SURGEON: Festus BarrenJason Dew, M.D.   ANESTHESIA: Local with moderate conscious sedation.   ESTIMATED BLOOD LOSS: Approximately 50 mL.   INDICATION FOR PROCEDURE: This is a female, who was referred to us for PAD with claudication. She has CT angiogram, which demonstrated significant aortoiliac disease, although it was somewhat difficult to interpret entirely given her lifestyle limiting symptoms and attempt at revascularization was offered and she desired to proceed. Risks and benefits were discussed. Informed consent was obtained.   DESCRIPTION OF PROCEDURE: The patient is brought to the vascular and interventional radiology suite. Groins were shaved and prepped and a sterile surgical field was created. Both femoral arteries accessed under direct ultrasound guidance due to the pulseless nature of the arteries and 6-French sheaths were placed bilaterally. Permanent image was recorded. Pigtail catheter was placed up from the left and AP aortogram was performed. The pigtail was then pulled down to the aortic bifurcation and pelvic obliques were performed. This showed patent renal arteries. The aorta was diffusely calcified, The distal aorta had very large lumbar arteries that provided  collateral flow to the right iliac, which was occluded and reconstituted in the external iliac artery. The left common iliac had, what appeared to be, a moderate stenosis at its origin and the left external iliac artery had moderate stenosis as well. The patient was heparinized. I was able to have easy access to the aorta from the left, but gaining access to the true lumen of the aorta from the right iliac occlusion was very difficult and I was not able to gain luminal access from the right today.   Multiple different catheters and wires were used. I even tried snaring the wire and reintroducing this along with the left-sided wire, but no measures were able to get true luminal access from the right femoral approach. After about 20 minutes of fluoroscopy and realizing that the dissection plane was significant and I was not going to be able to get back into the true lumen from the right, I elected to just treat the left iliac lesions today and reassess her clinically in followup. StarClose closure device was deployed in both femoral arteries with excellent hemostatic result. The patient tolerated the procedure well and was taken to the recovery room in stable condition.  ____________________________ Annice NeedyJason S. Dew, MD jsd:aw D: 08/31/2013 14:51:26 ET T: 08/31/2013 15:19:33 ET JOB#: [id number]  cc: Annice NeedyJason S. Dew, MD, <Dictator> Verta EllenMonica A. Manzi, PA-C Annice NeedyJASON S DEW MD ELECTRONICALLY SIGNED 09/09/2013 10:25

## 2015-03-11 NOTE — Consult Note (Signed)
Brief Consult Note: Diagnosis: Overdose.   Comments: Krystal Lee is too sedated to participate in interview. Will attempt in the morning again. She will likely need admission..  Electronic Signatures: Kristine LineaPucilowska, Prisha Hiley (MD)  (Signed 08-Dec-14 21:42)  Authored: Brief Consult Note   Last Updated: 08-Dec-14 21:42 by Kristine LineaPucilowska, Mashawn Brazil (MD)

## 2015-03-11 NOTE — Consult Note (Signed)
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   Patient was seen by consultant.   Consult note dictated.   Orders entered.   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 Medical consult requested for elevated INR from coumadin overdose  * Supratherapeutic INR- patient admits to taking more coumadin- didnt quantify how many extra pills she took as still cofnused and restless? manic vit K IM once and no active bleeding recheck in am  * Hypoxia- recheck sats, CT chest was already done which showed no PE, but emphysematous changes sats >89% acceptable, patient never been checked to see if needs home o2 started advair for COPD  * Tobacco use diosrder- counselled, not ready to quit, nicotine patch  * Major depression with drug overdose- management per psych  * Hyperlipidemia- crestor.  Electronic Signatures: Enid BaasKalisetti, Fumiye Lubben (MD)  (Signed 09-Dec-14 18:20)  Authored: Brief Consult Note   Last Updated: 09-Dec-14 18:20 by Enid BaasKalisetti, Shandy Checo (MD)
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
## 2015-03-31 ENCOUNTER — Emergency Department
Admission: EM | Admit: 2015-03-31 | Discharge: 2015-03-31 | Disposition: A | Discharge status: Home / Self Care | Payer: Medicare Other | Attending: Emergency Medicine | Admitting: Emergency Medicine

## 2015-03-31 ENCOUNTER — Emergency Department: Payer: Medicare Other

## 2015-03-31 DIAGNOSIS — S3992XA Unspecified injury of lower back, initial encounter: Secondary | ICD-10-CM | POA: Diagnosis present

## 2015-03-31 DIAGNOSIS — S32019A Unspecified fracture of first lumbar vertebra, initial encounter for closed fracture: Secondary | ICD-10-CM | POA: Diagnosis not present

## 2015-03-31 DIAGNOSIS — W010XXA Fall on same level from slipping, tripping and stumbling without subsequent striking against object, initial encounter: Secondary | ICD-10-CM | POA: Insufficient documentation

## 2015-03-31 DIAGNOSIS — N390 Urinary tract infection, site not specified: Secondary | ICD-10-CM

## 2015-03-31 DIAGNOSIS — Y9289 Other specified places as the place of occurrence of the external cause: Secondary | ICD-10-CM | POA: Insufficient documentation

## 2015-03-31 DIAGNOSIS — S32010A Wedge compression fracture of first lumbar vertebra, initial encounter for closed fracture: Secondary | ICD-10-CM

## 2015-03-31 DIAGNOSIS — Y998 Other external cause status: Secondary | ICD-10-CM | POA: Diagnosis not present

## 2015-03-31 DIAGNOSIS — Y9389 Activity, other specified: Secondary | ICD-10-CM | POA: Diagnosis not present

## 2015-03-31 LAB — URINALYSIS COMPLETE WITH MICROSCOPIC (ARMC ONLY)
Bilirubin Urine: NEGATIVE
GLUCOSE, UA: NEGATIVE mg/dL
HGB URINE DIPSTICK: NEGATIVE
Ketones, ur: NEGATIVE mg/dL
NITRITE: NEGATIVE
PROTEIN: NEGATIVE mg/dL
SPECIFIC GRAVITY, URINE: 1.008 (ref 1.005–1.030)
pH: 6 (ref 5.0–8.0)

## 2015-03-31 MED ORDER — ORPHENADRINE CITRATE 30 MG/ML IJ SOLN
60.0000 mg | Freq: Once | INTRAMUSCULAR | Status: AC
  Administered 2015-03-31: 60 mg via INTRAMUSCULAR

## 2015-03-31 MED ORDER — KETOROLAC TROMETHAMINE 60 MG/2ML IM SOLN
60.0000 mg | Freq: Once | INTRAMUSCULAR | Status: AC
  Administered 2015-03-31: 60 mg via INTRAMUSCULAR

## 2015-03-31 MED ORDER — ORPHENADRINE CITRATE 30 MG/ML IJ SOLN
INTRAMUSCULAR | Status: AC
  Administered 2015-03-31: 60 mg via INTRAMUSCULAR
  Filled 2015-03-31: qty 2

## 2015-03-31 MED ORDER — OXYCODONE-ACETAMINOPHEN 7.5-325 MG PO TABS: 1.0000 | ORAL_TABLET | ORAL | Status: AC | PRN

## 2015-03-31 MED ORDER — HYDROMORPHONE HCL 1 MG/ML IJ SOLN
1.0000 mg | Freq: Once | INTRAMUSCULAR | Status: AC
  Administered 2015-03-31: 1 mg via INTRAMUSCULAR

## 2015-03-31 MED ORDER — SULFAMETHOXAZOLE-TRIMETHOPRIM 800-160 MG PO TABS: 1.0000 | ORAL_TABLET | Freq: Two times a day (BID) | ORAL | Status: DC

## 2015-03-31 MED ORDER — KETOROLAC TROMETHAMINE 60 MG/2ML IM SOLN
INTRAMUSCULAR | Status: AC
  Administered 2015-03-31: 60 mg via INTRAMUSCULAR
  Filled 2015-03-31: qty 2

## 2015-03-31 MED ORDER — HYDROMORPHONE HCL 1 MG/ML IJ SOLN
INTRAMUSCULAR | Status: AC
  Administered 2015-03-31: 1 mg via INTRAMUSCULAR
  Filled 2015-03-31: qty 1

## 2015-03-31 NOTE — Discharge Instructions (Signed)
Take medications as directed and follow up with Family Doctor.

## 2015-03-31 NOTE — ED Provider Notes (Signed)
[facility] Emergency Department Provider Note  ____________________________________________  Time seen: Approximately 5:06 PM  I have reviewed the triage vital signs and the nursing notes.   HISTORY  Chief Complaint Back Pain and Fall    HPI Krystal Lee is a 59 y.o. female patient complained of low back pain secondary to a slip and fall 2 days ago. Patient also states she is having some lower abdominal pain secondary to this fall. Patient is rating her pain as a 10 over 10. Patient describes the pain as sharp but no radicular component. She states pain is refractory to over-the-counter medications. Patient denies any bowel or bladder dysfunction secondary to this fall.   No past medical history on file.  There are no active problems to display for this patient.   No past surgical history on file.  Current Outpatient Rx  Name  Route  Sig  Dispense  Refill  . oxyCODONE-acetaminophen (PERCOCET) 7.5-325 MG per tablet   Oral   Take 1 tablet by mouth every 4 (four) hours as needed for severe pain.   20 tablet   0   . sulfamethoxazole-trimethoprim (BACTRIM DS,SEPTRA DS) 800-160 MG per tablet   Oral   Take 1 tablet by mouth 2 (two) times daily.   20 tablet   0     Allergies Review of patient's allergies indicates no known allergies.  No family history on file.  Social History History  Substance Use Topics  . Smoking status: Not on file  . Smokeless tobacco: Not on file  . Alcohol Use: Not on file    Review of Systems Constitutional: No fever/chills  appears in moderate distress. Eyes: No visual changes. ENT: No sore throat. Cardiovascular: Denies chest pain. Respiratory: Denies shortness of breath. Gastrointestinal: States lower abdominal pain.  No nausea, no vomiting.  No diarrhea.  No constipation. Genitourinary: Negative for dysuria. Musculoskeletal: Positive for back pain. Skin: Negative for rash. Neurological: Negative for  headaches, focal weakness or numbness. 10-point ROS otherwise negative.  ____________________________________________   PHYSICAL EXAM:  VITAL SIGNS: ED Triage Vitals  Enc Vitals Group     BP 03/31/15 1558 151/101 mmHg     Pulse Rate 03/31/15 1558 114     Resp 03/31/15 1558 18     Temp 03/31/15 1558 98 F (36.7 C)     Temp Source 03/31/15 1558 Oral     SpO2 03/31/15 1558 95 %     Weight 03/31/15 1558 125 lb (56.7 kg)     Height 03/31/15 1558 4\' 11"  (1.499 m)     Head Cir --      Peak Flow --      Pain Score 03/31/15 1559 10     Pain Loc --      Pain Edu? --      Excl. in GC? --     Constitutional: Alert and oriented. Moderate distress. Systems stable heavy reliance on upper extremity. Eyes: Conjunctivae are normal. PERRL. EOMI. Head: Atraumatic. Nose: No congestion/rhinnorhea. Mouth/Throat: Mucous membranes are moist.  Oropharynx non-erythematous. Neck: No stridor. Neck is supple. Hematological/Lymphatic/Immunilogical: No cervical lymphadenopathy. Cardiovascular: Normal rate, regular rhythm. Grossly normal heart sounds.  Good peripheral circulation. Respiratory: Normal respiratory effort.  No retractions. Lungs CTAB. Gastrointestinal: Soft and guarding with palpation suprapubic area.. No distention. No abdominal bruits. No CVA tenderness. Musculoskeletal: No obvious spinal deformity. Patient has some moderate guarding palpation L1 through L3. Patient decreased range of motion's all fields. Neurologic:  Normal speech and language. No  gross focal neurologic deficits are appreciated. Speech is normal. No gait instability. Skin:  Skin is warm, dry and intact. No rash noted. No ecchymosis visible on the C-spine all coccyx area. Psychiatric: Mood and affect are normal. Speech and behavior are normal.  ____________________________________________   LABS (all labs ordered are listed, but only abnormal results are displayed)  Labs Reviewed  URINALYSIS COMPLETEWITH MICROSCOPIC  ([facility])  - Abnormal; Notable for the following:    Color, Urine YELLOW (*)    APPearance HAZY (*)    Leukocytes, UA 3+ (*)    Bacteria, UA FEW (*)    Squamous Epithelial / LPF 0-5 (*)    All other components within normal limits   _____________________________ urinalysis shows too numerous to count wbc's consistent urinary tract infection. _______________  EKG   ____________________________________________  RADIOLOGY  Mild endplate compression fracture of L1, CT scan of the lumbar spine confirm L1 compression fracture. ____________________________________________   PROCEDURES  Procedure(s) performed: None  Critical Care performed: No  ____________________________________________   INITIAL IMPRESSION / ASSESSMENT AND PLAN / ED COURSE  Pertinent labs & imaging results that were available during my care of the patient were reviewed by me and considered in my medical decision making (see chart for details).  Compression fracture L1. We'll get a CT of the back for more definitive clarification of this fracture. ____________________________________________   FINAL CLINICAL IMPRESSION(S) / ED DIAGNOSES  Final diagnoses:  Compression fracture of L1 lumbar vertebra, closed, initial encounter  UTI (lower urinary tract infection)      Krystal Reiningonald K Smith, PA-C 03/31/15 1841  Governor Rooksebecca Lord, MD 03/31/15 [id number]

## 2015-03-31 NOTE — ED Notes (Signed)
Pt states she tripped and fell on Tuesday and has been having lower back pain since

## 2016-08-26 ENCOUNTER — Emergency Department
Admission: EM | Admit: 2016-08-26 | Discharge: 2016-08-27 | Disposition: A | Discharge status: Home / Self Care | Payer: Medicare Other | Attending: Emergency Medicine | Admitting: Emergency Medicine

## 2016-08-26 ENCOUNTER — Encounter: Payer: Self-pay | Admitting: Emergency Medicine

## 2016-08-26 ENCOUNTER — Emergency Department: Payer: Medicare Other

## 2016-08-26 DIAGNOSIS — R0600 Dyspnea, unspecified: Secondary | ICD-10-CM

## 2016-08-26 DIAGNOSIS — J441 Chronic obstructive pulmonary disease with (acute) exacerbation: Secondary | ICD-10-CM | POA: Insufficient documentation

## 2016-08-26 DIAGNOSIS — F17210 Nicotine dependence, cigarettes, uncomplicated: Secondary | ICD-10-CM | POA: Insufficient documentation

## 2016-08-26 DIAGNOSIS — F10120 Alcohol abuse with intoxication, uncomplicated: Secondary | ICD-10-CM | POA: Diagnosis not present

## 2016-08-26 DIAGNOSIS — F10920 Alcohol use, unspecified with intoxication, uncomplicated: Secondary | ICD-10-CM

## 2016-08-26 DIAGNOSIS — R0602 Shortness of breath: Secondary | ICD-10-CM | POA: Diagnosis present

## 2016-08-26 DIAGNOSIS — I10 Essential (primary) hypertension: Secondary | ICD-10-CM | POA: Diagnosis not present

## 2016-08-26 HISTORY — DX: Chronic obstructive pulmonary disease, unspecified: J44.9

## 2016-08-26 HISTORY — DX: Essential (primary) hypertension: I10

## 2016-08-26 MED ORDER — METHYLPREDNISOLONE SODIUM SUCC 125 MG IJ SOLR: 125.0000 mg | Freq: Once | INTRAMUSCULAR | Status: DC

## 2016-08-26 MED ORDER — IPRATROPIUM-ALBUTEROL 0.5-2.5 (3) MG/3ML IN SOLN
RESPIRATORY_TRACT | Status: AC
  Filled 2016-08-26: qty 6

## 2016-08-26 MED ORDER — ALBUTEROL SULFATE (2.5 MG/3ML) 0.083% IN NEBU: 5.0000 mg | INHALATION_SOLUTION | Freq: Once | RESPIRATORY_TRACT | Status: DC

## 2016-08-26 MED ORDER — IPRATROPIUM-ALBUTEROL 0.5-2.5 (3) MG/3ML IN SOLN
6.0000 mL | Freq: Once | RESPIRATORY_TRACT | Status: AC
  Administered 2016-08-26: 6 mL via RESPIRATORY_TRACT

## 2016-08-26 MED ORDER — METHYLPREDNISOLONE SODIUM SUCC 125 MG IJ SOLR
125.0000 mg | Freq: Once | INTRAMUSCULAR | Status: AC
  Administered 2016-08-26: 125 mg via INTRAVENOUS

## 2016-08-26 MED ORDER — IPRATROPIUM-ALBUTEROL 0.5-2.5 (3) MG/3ML IN SOLN: 3.0000 mL | Freq: Once | RESPIRATORY_TRACT | Status: DC

## 2016-08-26 MED ORDER — METHYLPREDNISOLONE SODIUM SUCC 125 MG IJ SOLR
INTRAMUSCULAR | Status: AC
  Filled 2016-08-26: qty 2

## 2016-08-26 NOTE — ED Triage Notes (Signed)
Per [facility] pt. Called for SOB for past month, hx COPD, current 1 pkday smoker. Pt. States she just has bronchitis, and needs to be tx b/c her "breathing is so bad"

## 2016-08-26 NOTE — ED Provider Notes (Signed)
[facility] Emergency Department Provider Note        Time seen: ----------------------------------------- 11:01 PM on 08/26/2016 -----------------------------------------    I have reviewed the triage vital signs and the nursing notes.   HISTORY  Chief Complaint Shortness of Breath    HPI Krystal Lee is a 60 y.o. female who presents to ER for shortness breath last month. Patient states she has history of COPD, currently a pack a day smoker. Patient thinks she just has bronchitis and needs to be treated because her breathing is so bad. She admits drinking 6 beers tonight. She denies fevers or chills, denies recent pneumonia symptoms. She states she was given 2 DuoNeb times prior to arrival without significant improvement in her symptoms.   Past Medical History:  Diagnosis Date  . COPD (chronic obstructive pulmonary disease) (HCC)     There are no active problems to display for this patient.   History reviewed. No pertinent surgical history.  Allergies Review of patient's allergies indicates no known allergies.  Social History Social History  Substance Use Topics  . Smoking status: Current Every Day Smoker    Packs/day: 1.00    Types: Cigarettes  . Smokeless tobacco: Never Used  . Alcohol use 3.6 oz/week    6 Cans of beer per week    Review of Systems Constitutional: Negative for fever. Cardiovascular: Negative for chest pain. Respiratory: Positive for shortness of breath Gastrointestinal: Negative for abdominal pain, vomiting and diarrhea. Genitourinary: Negative for dysuria. Musculoskeletal: Negative for back pain. Skin: Negative for rash. Neurological: Negative for headaches, focal weakness or numbness.  10-point ROS otherwise negative.  ____________________________________________   PHYSICAL EXAM:  VITAL SIGNS: ED Triage Vitals  Enc Vitals Group     BP 08/26/16 2235 (!) 160/104     Pulse --      Resp 08/26/16 2235  19     Temp 08/26/16 2235 98.1 F (36.7 C)     Temp src --      SpO2 08/26/16 2229 96 %     Weight 08/26/16 2238 134 lb (60.8 kg)     Height 08/26/16 2238 4\' 11"  (1.499 m)     Head Circumference --      Peak Flow --      Pain Score --      Pain Loc --      Pain Edu? --      Excl. in GC? --     Constitutional: Alert and oriented. Well appearing and in no distress. Eyes: Conjunctivae are normal. PERRL. Normal extraocular movements. ENT   Head: Normocephalic and atraumatic.   Nose: No congestion/rhinnorhea.   Mouth/Throat: Mucous membranes are moist.   Neck: No stridor. Cardiovascular: Normal rate, regular rhythm. No murmurs, rubs, or gallops. Respiratory: Normal respiratory effort without tachypnea nor retractions. Lateral wheezing is noted Gastrointestinal: Soft and nontender. Normal bowel sounds Musculoskeletal: Nontender with normal range of motion in all extremities. No lower extremity tenderness nor edema. Neurologic:  Normal speech and language. No gross focal neurologic deficits are appreciated.  Skin:  Skin is warm, dry and intact. No rash noted. Psychiatric: Mood and affect are normal. Speech and behavior are normal.  ____________________________________________  EKG: Interpreted by me. Sinus tachycardia with a rate of 118 bpm, normal PR interval, normal QRS, normal axis. No evidence of acute infarction  ____________________________________________  ED COURSE:  Pertinent labs & imaging results that were available during my care of the patient were reviewed by me and considered in  my medical decision making (see chart for details). Clinical Course  Patient presents intoxicated with shortness of breath, likely COPD exacerbation. She'll be given additional DuoNeb since steroids.  Procedures ____________________________________________   LABS (pertinent positives/negatives)  Labs Reviewed  BASIC METABOLIC PANEL  CBC  CBC WITH DIFFERENTIAL/PLATELET   BRAIN NATRIURETIC PEPTIDE  TROPONIN I  ETHANOL    RADIOLOGY  Chest x-ray Is pending at this time ____________________________________________  FINAL ASSESSMENT AND PLAN  COPD exacerbation  Plan: Patient with labs and imaging as dictated above. Patient is in no acute distress, currently mildly improved after DuoNeb since steroids. Patient care checked out to Dr. York CeriseForbach for final disposition.   Emily FilbertWilliams, Chieko Neises E, MD   Note: This dictation was prepared with Dragon dictation. Any transcriptional errors that result from this process are unintentional    Emily FilbertJonathan E Zendaya Groseclose, MD 08/26/16 [id number]

## 2016-08-26 NOTE — ED Provider Notes (Signed)
-----------------------------------------   11:39 PM on 08/26/2016 -----------------------------------------   Blood pressure (!) 160/104, temperature 98.1 F (36.7 C), resp. rate 19, height 4\' 11"  (1.499 m), weight 60.8 kg, SpO2 97 %.  Assuming care from Dr. Mayford KnifeWilliams.  In short, Krystal Lee is a 60 y.o. female with a chief complaint of Shortness of Breath .  Refer to the original H&P for additional details.  The current plan of care is to follow up on her labs and her CT angiogram of her chest and reassess.   Clinical Course  Value Comment By Time  CT Angio Chest PE W and/or Wo Contrast No evidence of acute abnormality on the CT angios chest.  I suspect the patient is suffering from a COPD exacerbation as well as acute alcohol intoxication.  We will continue to monitor her Friday as well as for her respiratory status. Krystal Roseory Dustine Bertini, MD 10/09 [phone]   Patient awake, alert, oriented.  Clinically sober but will call for a ride.  No dyspnea currently.  Upon auscultation the patient still has some mild expiratory wheezing.  I will treat for COPD exacerbation as an outpatient and I counseled her about her excessive alcohol use. Krystal Roseory Krystal Rambeau, MD 10/09 [phone]      Krystal Roseory Krystal Witham, MD 08/27/16 [phone]

## 2016-08-27 ENCOUNTER — Emergency Department: Payer: Medicare Other

## 2016-08-27 DIAGNOSIS — J441 Chronic obstructive pulmonary disease with (acute) exacerbation: Secondary | ICD-10-CM | POA: Diagnosis not present

## 2016-08-27 LAB — CBC WITH DIFFERENTIAL/PLATELET
Basophils Absolute: 0.2 10*3/uL — ABNORMAL HIGH (ref 0–0.1)
Basophils Relative: 1 %
EOS ABS: 0.1 10*3/uL (ref 0–0.7)
Eosinophils Relative: 1 %
HCT: 41.4 % (ref 35.0–47.0)
HEMOGLOBIN: 14.4 g/dL (ref 12.0–16.0)
LYMPHS ABS: 1.6 10*3/uL (ref 1.0–3.6)
Lymphocytes Relative: 10 %
MCH: 31.3 pg (ref 26.0–34.0)
MCHC: 34.9 g/dL (ref 32.0–36.0)
MCV: 89.9 fL (ref 80.0–100.0)
Monocytes Absolute: 0.2 10*3/uL (ref 0.2–0.9)
Monocytes Relative: 1 %
NEUTROS ABS: 13.2 10*3/uL — AB (ref 1.4–6.5)
NEUTROS PCT: 87 %
Platelets: 210 10*3/uL (ref 150–440)
RBC: 4.61 MIL/uL (ref 3.80–5.20)
RDW: 13.9 % (ref 11.5–14.5)
WBC: 15.3 10*3/uL — AB (ref 3.6–11.0)

## 2016-08-27 LAB — BASIC METABOLIC PANEL
Anion gap: 14 (ref 5–15)
BUN: 8 mg/dL (ref 6–20)
CHLORIDE: 104 mmol/L (ref 101–111)
CO2: 17 mmol/L — AB (ref 22–32)
CREATININE: 0.45 mg/dL (ref 0.44–1.00)
Calcium: 8.7 mg/dL — ABNORMAL LOW (ref 8.9–10.3)
GFR calc non Af Amer: >60 mL/min (ref >60)
Glucose, Bld: 121 mg/dL — ABNORMAL HIGH (ref 65–99)
POTASSIUM: 3.2 mmol/L — AB (ref 3.5–5.1)
Sodium: 135 mmol/L (ref 135–145)

## 2016-08-27 LAB — TROPONIN I: Troponin I: <0.03 ng/mL (ref <0.03)

## 2016-08-27 LAB — BRAIN NATRIURETIC PEPTIDE: B NATRIURETIC PEPTIDE 5: 41 pg/mL (ref 0.0–100.0)

## 2016-08-27 LAB — ETHANOL: Alcohol, Ethyl (B): 309 mg/dL (ref <5)

## 2016-08-27 MED ORDER — PREDNISONE 20 MG PO TABS: 60.0000 mg | ORAL_TABLET | Freq: Every day | ORAL | 0 refills | Status: DC

## 2016-08-27 MED ORDER — ACETAMINOPHEN 500 MG PO TABS
ORAL_TABLET | ORAL | Status: AC
  Filled 2016-08-27: qty 2

## 2016-08-27 MED ORDER — ACETAMINOPHEN 500 MG PO TABS
1000.0000 mg | ORAL_TABLET | Freq: Once | ORAL | Status: AC
  Administered 2016-08-27: 1000 mg via ORAL

## 2016-08-27 MED ORDER — GI COCKTAIL ~~LOC~~
30.0000 mL | Freq: Once | ORAL | Status: AC
  Administered 2016-08-27: 30 mL via ORAL

## 2016-08-27 MED ORDER — IOPAMIDOL (ISOVUE-370) INJECTION 76%
75.0000 mL | Freq: Once | INTRAVENOUS | Status: AC | PRN
  Administered 2016-08-27: 75 mL via INTRAVENOUS

## 2016-08-27 MED ORDER — GI COCKTAIL ~~LOC~~
ORAL | Status: AC
  Filled 2016-08-27: qty 30

## 2016-08-27 NOTE — Discharge Instructions (Signed)
We believe that your symptoms are caused today by an exacerbation of your COPD.  Please take the prescribed medications and any medications that you have at home for your COPD.  Follow up with your doctor as recommended.  If you develop any new or worsening symptoms, including but not limited to fever, persistent vomiting, worsening shortness of breath, or other symptoms that concern you, please return to the Emergency Department immediately.  Additionally, you were heavily intoxicated, which most likely also contributed to your symptoms.  Please consider cutting back on your alcohol consumption and discussing your alcohol use with your primary care doctor.

## 2016-08-31 ENCOUNTER — Other Ambulatory Visit: Payer: Self-pay | Admitting: Otolaryngology

## 2016-08-31 DIAGNOSIS — H9319 Tinnitus, unspecified ear: Secondary | ICD-10-CM

## 2016-08-31 DIAGNOSIS — R42 Dizziness and giddiness: Secondary | ICD-10-CM

## 2016-09-14 ENCOUNTER — Ambulatory Visit
Admission: RE | Admit: 2016-09-14 | Discharge: 2016-09-14 | Disposition: A | Discharge status: Home / Self Care | Payer: Medicare Other | Source: Ambulatory Visit | Attending: Otolaryngology | Admitting: Otolaryngology

## 2016-09-14 DIAGNOSIS — H9319 Tinnitus, unspecified ear: Secondary | ICD-10-CM | POA: Insufficient documentation

## 2016-09-14 DIAGNOSIS — R42 Dizziness and giddiness: Secondary | ICD-10-CM | POA: Insufficient documentation

## 2016-09-14 MED ORDER — GADOBENATE DIMEGLUMINE 529 MG/ML IV SOLN
10.0000 mL | Freq: Once | INTRAVENOUS | Status: AC | PRN
  Administered 2016-09-14: 10 mL via INTRAVENOUS

## 2017-07-23 ENCOUNTER — Inpatient Hospital Stay
Admission: RE | Admit: 2017-07-23 | Discharge: 2017-07-23 | Disposition: A | Discharge status: Home / Self Care | Payer: Self-pay | Source: Ambulatory Visit | Attending: *Deleted | Admitting: *Deleted

## 2017-07-23 ENCOUNTER — Other Ambulatory Visit: Payer: Self-pay | Admitting: Family Medicine

## 2017-07-23 ENCOUNTER — Other Ambulatory Visit: Payer: Self-pay | Admitting: *Deleted

## 2017-07-23 DIAGNOSIS — Z9289 Personal history of other medical treatment: Secondary | ICD-10-CM

## 2017-07-23 DIAGNOSIS — R928 Other abnormal and inconclusive findings on diagnostic imaging of breast: Secondary | ICD-10-CM

## 2017-07-31 ENCOUNTER — Ambulatory Visit: Payer: Medicare Other | Admitting: Oncology

## 2017-07-31 ENCOUNTER — Telehealth: Payer: Self-pay | Admitting: Oncology

## 2017-07-31 NOTE — Telephone Encounter (Signed)
Patient called to rschd appt with Dr Cathie HoopsYu to mid October, 2018. 07/30/17 MF

## 2017-07-31 NOTE — Telephone Encounter (Signed)
Patient called to rschd appt due to inclement weather and Public transportation. (Date & time, per patient.) MF

## 2017-08-02 ENCOUNTER — Inpatient Hospital Stay: Payer: Medicare Other | Admitting: Oncology

## 2017-08-06 ENCOUNTER — Other Ambulatory Visit: Payer: Medicare Other

## 2017-08-07 ENCOUNTER — Emergency Department
Admission: EM | Admit: 2017-08-07 | Discharge: 2017-08-07 | Disposition: A | Discharge status: Home / Self Care | Payer: Medicare Other | Attending: Emergency Medicine | Admitting: Emergency Medicine

## 2017-08-07 ENCOUNTER — Emergency Department: Payer: Medicare Other

## 2017-08-07 ENCOUNTER — Encounter: Payer: Self-pay | Admitting: Emergency Medicine

## 2017-08-07 DIAGNOSIS — S2239XA Fracture of one rib, unspecified side, initial encounter for closed fracture: Secondary | ICD-10-CM | POA: Insufficient documentation

## 2017-08-07 DIAGNOSIS — I10 Essential (primary) hypertension: Secondary | ICD-10-CM | POA: Insufficient documentation

## 2017-08-07 DIAGNOSIS — S29001A Unspecified injury of muscle and tendon of front wall of thorax, initial encounter: Secondary | ICD-10-CM | POA: Diagnosis present

## 2017-08-07 DIAGNOSIS — F17210 Nicotine dependence, cigarettes, uncomplicated: Secondary | ICD-10-CM | POA: Diagnosis not present

## 2017-08-07 DIAGNOSIS — W2203XA Walked into furniture, initial encounter: Secondary | ICD-10-CM | POA: Diagnosis not present

## 2017-08-07 DIAGNOSIS — Y999 Unspecified external cause status: Secondary | ICD-10-CM | POA: Insufficient documentation

## 2017-08-07 DIAGNOSIS — R0781 Pleurodynia: Secondary | ICD-10-CM

## 2017-08-07 DIAGNOSIS — Y939 Activity, unspecified: Secondary | ICD-10-CM | POA: Insufficient documentation

## 2017-08-07 DIAGNOSIS — Y929 Unspecified place or not applicable: Secondary | ICD-10-CM | POA: Diagnosis not present

## 2017-08-07 DIAGNOSIS — R0602 Shortness of breath: Secondary | ICD-10-CM | POA: Diagnosis not present

## 2017-08-07 DIAGNOSIS — J449 Chronic obstructive pulmonary disease, unspecified: Secondary | ICD-10-CM | POA: Insufficient documentation

## 2017-08-07 DIAGNOSIS — W19XXXA Unspecified fall, initial encounter: Secondary | ICD-10-CM

## 2017-08-07 MED ORDER — DEXAMETHASONE SODIUM PHOSPHATE 10 MG/ML IJ SOLN
10.0000 mg | Freq: Once | INTRAMUSCULAR | Status: AC
  Administered 2017-08-07: 10 mg via INTRAMUSCULAR
  Filled 2017-08-07: qty 1

## 2017-08-07 MED ORDER — PREDNISONE 10 MG (21) PO TBPK: ORAL_TABLET | ORAL | 0 refills | Status: DC

## 2017-08-07 NOTE — Discharge Instructions (Signed)
Take medication as prescribed. Return to emergency department if symptoms worsen and follow-up with PCP as needed.   ° °Get help right away if: °You have a fever. °You have trouble breathing. °You cannot stop coughing. °You cough up thick or bloody spit (mucus). °You feel sick to your stomach (nauseous), throw up (vomit), or have belly (abdominal) pain. °Your pain gets worse and medicine does not help. °

## 2017-08-07 NOTE — ED Notes (Signed)
Patient reports she had has had back pain x 8 days since a fall.  Patient states, "my psychiatrist gave me some pain pills, hoping that would help, but it hasn't, so here I am."  Patient also reports increased shortness of breath over the past month.  Patient appears pale.  Patient states her oxygen level was 86% at home.  Patient has history of copd.  Patient states, "I ain't never had no problems like this before."

## 2017-08-07 NOTE — ED Triage Notes (Signed)
Pt states she fell last week and hit her back on furniture in her room, states psychiatrist gave her percocet until she could be seen, pt states her lower back is hurting and she has no more pain meds. States she can't take a full breath due to the pain. Sats 91-93% on RA. No distress noted.

## 2017-08-07 NOTE — ED Provider Notes (Signed)
[facility] Emergency Department Provider Note   ____________________________________________   I have reviewed the triage vital signs and the nursing notes.   HISTORY  Chief Complaint Back Pain    HPI Krystal Lee is a 61 y.o. female Presents to the emergency department with right posterior rib pain since falling last week. Patient described during the fall she landed on a piece of furniture along the right posterior aspect of her rib. Patient denies feeling a "pop" or crepitus after the fall. Since the fall, she notes crepitus and intermittent shortness of breath. Patient denies sustaining any other injuries during the fall. Patient reported rest, heat, wrapping her torso with an Ace wrap, ibuprofen and her psychiatrist gave her Percocet to take 3 times a day as needed as alleviating measures. Patient states, shortness of breath is her biggest concern then the rib pain. Patient reports significant history of asthma and COPD. She is on 3 lpm of O2 at night and use nebulizer daily as wells as her inhaler regimen, Symbicort and proair. Patient denies fever, chills, headache, vision changes, chest pain, chest tightness, shortness of breath, abdominal pain, nausea and vomiting.  Past Medical History:  Diagnosis Date  . COPD (chronic obstructive pulmonary disease) (HCC)   . Hypertension     There are no active problems to display for this patient.   History reviewed. No pertinent surgical history.  Prior to Admission medications   Medication Sig Start Date End Date Taking? Authorizing Provider  predniSONE (STERAPRED UNI-PAK 21 TAB) 10 MG (21) TBPK tablet Take 6 tablets on day 1. Take 5 tablets on day 2. Take 4 tablets on day 3. Take 3 tablets on day 4. Take 2 tablets on day 5. Take 1 tablets on day 6. 08/07/17   Little, Traci M, PA-C  sulfamethoxazole-trimethoprim (BACTRIM DS,SEPTRA DS) 800-160 MG per tablet Take 1 tablet by mouth 2 (two) times  daily. Patient not taking: Reported on 08/27/2016 03/31/15   Joni Reining, PA-C    Allergies Patient has no known allergies.  No family history on file.  Social History Social History  Substance Use Topics  . Smoking status: Current Every Day Smoker    Packs/day: 1.00    Types: Cigarettes  . Smokeless tobacco: Never Used  . Alcohol use 3.6 oz/week    6 Cans of beer per week    Review of Systems Constitutional: Negative for fever/chills Eyes: No visual changes. ENT:  Negative for sore throat and for difficulty swallowing Cardiovascular: Denies chest pain. Respiratory: Denies cough. Positive shortness of breath. Gastrointestinal: No abdominal pain.  No nausea, vomiting, diarrhea. Genitourinary: Negative for dysuria. Musculoskeletal: Positive for right posterior rib pain.  Skin: Negative for rash. Neurological: Negative for headaches.  Able to ambulate. ____________________________________________   PHYSICAL EXAM:  VITAL SIGNS: ED Triage Vitals  Enc Vitals Group     BP 08/07/17 1712 104/82     Pulse Rate 08/07/17 1712 88     Resp 08/07/17 1712 18     Temp 08/07/17 1712 97.9 F (36.6 C)     Temp Source 08/07/17 1712 Oral     SpO2 08/07/17 1712 93 %     Weight 08/07/17 1714 132 lb (59.9 kg)     Height 08/07/17 1714  (1.499 m)     Head Circumference --      Peak Flow --      Pain Score 08/07/17 1712 10     Pain Loc --  Pain Edu? --      Excl. in GC? --     Constitutional: Alert and oriented. Well appearing and in no acute distress.  Eyes: Conjunctivae are normal. PERRL. EOMI  Head: Normocephalic and atraumatic. ENT:      Ears: Canals clear. TMs intact bilaterally.      Nose: No congestion/rhinnorhea.      Mouth/Throat: Mucous membranes are moist.  Neck:Supple. No thyromegaly. No stridor.  Cardiovascular: Normal rate, regular rhythm. Good peripheral circulation. Respiratory: Normal respiratory effort without tachypnea or retractions. Lungs CTAB.  Mild bilateral wheezes and rhonchi. Good air entry to the bases with no decreased or absent breath sounds. Hematological/Lymphatic/Immunological: No cervical lymphadenopathy. Cardiovascular: Normal rate, regular rhythm. Normal distal pulses. Gastrointestinal: Bowel sounds 4 quadrants. Soft and nontender to palpation. No guarding or rigidity. No palpable masses. No distention. No CVA tenderness. Musculoskeletal: Right side posterior rib pain. Normal torso movement with mild pain. No deformity or crepitus noted.  Neurologic: Normal speech and language.  Skin:  Skin is warm, dry and intact. No rash noted. Psychiatric: Mood and affect are normal. Speech and behavior are normal. Patient exhibits appropriate insight and judgement.  ____________________________________________   LABS (all labs ordered are listed, but only abnormal results are displayed)  Labs Reviewed - No data to display ____________________________________________  EKG none ____________________________________________  RADIOLOGY DG unilateral rib w/ chest right  FINDINGS: Mild hyperinflation compatible with emphysema. No focal pneumonia, collapse or consolidation. Negative for edema, effusion or pneumothorax. Trachea is midline. Normal heart size and vascularity. Atherosclerosis of the aorta.  Acute displaced posterolateral right ninth rib fracture noted. Old healed left rib fractures. No chest wall subcutaneous emphysema  IMPRESSION: Acute displaced right posterolateral ninth rib fracture.  No other acute finding.  COPD/emphysema  Imaging results reviewed, displaced rib fracture consistent with right side rib pain.  ____________________________________________   PROCEDURES  Procedure(s) performed: no    Critical Care performed: no ____________________________________________   INITIAL IMPRESSION / ASSESSMENT AND PLAN / ED COURSE  Pertinent labs & imaging results that were available during my care  of the patient were reviewed by me and considered in my medical decision making (see chart for details).  Patient presents to emergency department with right side rib pain and intermittent shortness of breath. History, physical exam findings and imaging reveal acute displaced fracture of the 9th rib without pneumothorax. Imaging results correlate with current symptoms. Patient will be prescribed presented taper for pain, inflammation and mild wheezing. Patient advised to follow up with PCP as needed or return to the emergency department if symptoms return or worsen. Patient informed of clinical course, understand medical decision-making process, and agree with plan.        If controlled substance prescribed during this visit, 12 month history viewed on the [facility] prior to issuing an initial prescription for Schedule II or III opiod. ____________________________________________   FINAL CLINICAL IMPRESSION(S) / ED DIAGNOSES  Final diagnoses:  Rib pain on right side  Shortness of breath  Closed traumatic displaced fracture of one rib  Fall, initial encounter       NEW MEDICATIONS STARTED DURING THIS VISIT:  New Prescriptions   PREDNISONE (STERAPRED UNI-PAK 21 TAB) 10 MG (21) TBPK TABLET    Take 6 tablets on day 1. Take 5 tablets on day 2. Take 4 tablets on day 3. Take 3 tablets on day 4. Take 2 tablets on day 5. Take 1 tablets on day 6.     Note:  This document was prepared  using Conservation officer, historic buildings and may include unintentional dictation errors.    Little, Jordan Likes, PA-C 08/07/17 1858    Merrily Brittle, MD 08/07/17 2332

## 2017-08-13 ENCOUNTER — Other Ambulatory Visit: Payer: Medicare Other

## 2017-08-14 ENCOUNTER — Ambulatory Visit: Payer: Medicare Other | Admitting: Oncology

## 2017-08-15 ENCOUNTER — Ambulatory Visit: Payer: Medicare Other | Admitting: Oncology

## 2017-08-23 ENCOUNTER — Ambulatory Visit
Admission: RE | Admit: 2017-08-23 | Discharge: 2017-08-23 | Disposition: A | Discharge status: Home / Self Care | Payer: Medicare Other | Source: Ambulatory Visit | Attending: Family Medicine | Admitting: Family Medicine

## 2017-08-23 DIAGNOSIS — R928 Other abnormal and inconclusive findings on diagnostic imaging of breast: Secondary | ICD-10-CM | POA: Diagnosis present

## 2017-08-28 ENCOUNTER — Encounter: Payer: Self-pay | Admitting: Oncology

## 2017-08-28 ENCOUNTER — Inpatient Hospital Stay: Payer: Medicare Other

## 2017-08-28 ENCOUNTER — Inpatient Hospital Stay: Payer: Medicare Other | Attending: Oncology | Admitting: Oncology

## 2017-08-28 VITALS — BP 101/71 | HR 97 | Temp 99.0°F | Wt 134.1 lb

## 2017-08-28 DIAGNOSIS — I10 Essential (primary) hypertension: Secondary | ICD-10-CM | POA: Diagnosis not present

## 2017-08-28 DIAGNOSIS — K219 Gastro-esophageal reflux disease without esophagitis: Secondary | ICD-10-CM

## 2017-08-28 DIAGNOSIS — F17210 Nicotine dependence, cigarettes, uncomplicated: Secondary | ICD-10-CM

## 2017-08-28 DIAGNOSIS — Z803 Family history of malignant neoplasm of breast: Secondary | ICD-10-CM

## 2017-08-28 DIAGNOSIS — F418 Other specified anxiety disorders: Secondary | ICD-10-CM

## 2017-08-28 DIAGNOSIS — D696 Thrombocytopenia, unspecified: Secondary | ICD-10-CM | POA: Diagnosis present

## 2017-08-28 DIAGNOSIS — Z800 Family history of malignant neoplasm of digestive organs: Secondary | ICD-10-CM

## 2017-08-28 DIAGNOSIS — B182 Chronic viral hepatitis C: Secondary | ICD-10-CM

## 2017-08-28 DIAGNOSIS — Z79899 Other long term (current) drug therapy: Secondary | ICD-10-CM | POA: Diagnosis not present

## 2017-08-28 DIAGNOSIS — J449 Chronic obstructive pulmonary disease, unspecified: Secondary | ICD-10-CM

## 2017-08-28 LAB — CBC WITH DIFFERENTIAL/PLATELET
BASOS ABS: 0.1 10*3/uL (ref 0–0.1)
BASOS PCT: 1 %
Eosinophils Absolute: 0.6 10*3/uL (ref 0–0.7)
Eosinophils Relative: 7 %
HEMATOCRIT: 44.8 % (ref 35.0–47.0)
HEMOGLOBIN: 15.1 g/dL (ref 12.0–16.0)
LYMPHS PCT: 32 %
Lymphs Abs: 2.7 10*3/uL (ref 1.0–3.6)
MCH: 31.3 pg (ref 26.0–34.0)
MCHC: 33.7 g/dL (ref 32.0–36.0)
MCV: 92.8 fL (ref 80.0–100.0)
MONO ABS: 0.6 10*3/uL (ref 0.2–0.9)
MONOS PCT: 7 %
NEUTROS ABS: 4.4 10*3/uL (ref 1.4–6.5)
NEUTROS PCT: 53 %
PLATELETS: 207 10*3/uL (ref 150–440)
RBC: 4.82 MIL/uL (ref 3.80–5.20)
RDW: 14.8 % — AB (ref 11.5–14.5)
WBC: 8.4 10*3/uL (ref 3.6–11.0)

## 2017-08-28 LAB — COMPREHENSIVE METABOLIC PANEL
ALT: 15 U/L (ref 14–54)
AST: 18 U/L (ref 15–41)
Albumin: 4 g/dL (ref 3.5–5.0)
Alkaline Phosphatase: 57 U/L (ref 38–126)
Anion gap: 12 (ref 5–15)
BILIRUBIN TOTAL: 0.5 mg/dL (ref 0.3–1.2)
BUN: 11 mg/dL (ref 6–20)
CHLORIDE: 103 mmol/L (ref 101–111)
CO2: 22 mmol/L (ref 22–32)
CREATININE: 0.65 mg/dL (ref 0.44–1.00)
Calcium: 9.7 mg/dL (ref 8.9–10.3)
Glucose, Bld: 91 mg/dL (ref 65–99)
Potassium: 4.2 mmol/L (ref 3.5–5.1)
Sodium: 137 mmol/L (ref 135–145)
TOTAL PROTEIN: 7.4 g/dL (ref 6.5–8.1)

## 2017-08-28 LAB — FOLATE: FOLATE: 9.1 ng/mL (ref >5.9)

## 2017-08-28 LAB — VITAMIN B12: VITAMIN B 12: 839 pg/mL (ref 180–914)

## 2017-08-28 NOTE — Progress Notes (Signed)
Patient here today as a new patient for thrombocytopenia  

## 2017-08-28 NOTE — Progress Notes (Signed)
Hematology/Oncology Consult note [facility] [phone] [phone]   Patient Care Team: Patient, No Pcp Per as PCP - General (General Practice)  REFERRING PROVIDER: Myrle Sheng CHIEF COMPLAINTS/PURPOSE OF CONSULTATION:  thrombocytopenia  HISTORY OF PRESENTING ILLNESS:  Krystal Lee is a 61 y.o.  female with PMH listed below who was referred to me for evaluation of thrombocytopenia.  Patient reports feeling well, without any complaints today. She had labs done with Primary care provider Franco Nones in May 2018 and also August 2018. Labs in May 2018 showed platelet was clumped. Labs in August 2018 showed borderline hypercalcemia 10.5, mildly high Hb at 15.5, and low platelet at 79,000.  Patient denies any bleeding events, blood in the stool or melena. She denies easy bruising. She tells me that she has chronic hepatitis C infection, due to remote IV drug abuse.   Review of Systems  Constitutional: Negative for fever, night sweats,unintentional weight loss, change in appetite. HENT: Negative for ear pain, hearing loss, nasal bleeding Eyes: Negative for eye pain, double vision   Respiratory: Negative for wheezing, shortness of breath, cough Cardiovascular: Negative for chest pain, palpitation.   Gastrointestinal: Negative abdominal pain, diarrhea, nausea vomiting Endocrine: Negative  Genitourinary: Negative for dysuria, hematuria, frequency Skin: Negative for rash, iching, bruising Neurological: Negative for headache, dizziness, seizure Hematological: Negative for easy bruising/bleeding, lymph node enlargement Psychiatric/Behavioral: Negative for depression, anxiety, suicidality  MEDICAL HISTORY:  Past Medical History:  Diagnosis Date  . Anxiety   . COPD (chronic obstructive pulmonary disease) (HCC)   . Depression   . GERD (gastroesophageal reflux disease)   . Hepatitis C virus   . Hypertension   . Post-menopausal      SURGICAL HISTORY: Past Surgical History:  Procedure Laterality Date  . CATARACT EXTRACTION  2010   right     SOCIAL HISTORY: Social History   Social History  . Marital status: Widowed    Spouse name: N/A  . Number of children: N/A  . Years of education: N/A   Occupational History  . Not on file.   Social History Main Topics  . Smoking status: Current Every Day Smoker    Packs/day: 1.00    Years: 43.00    Types: Cigarettes  . Smokeless tobacco: Never Used  . Alcohol use 0.6 oz/week    1 Cans of beer per week  . Drug use: No  . Sexual activity: Not on file   Other Topics Concern  . Not on file   Social History Narrative  . No narrative on file    FAMILY HISTORY: Family History  Problem Relation Age of Onset  . Breast cancer Paternal Grandmother   . Bone cancer Paternal Grandmother   . Pancreatic cancer Mother   . Heart disease Father   . Heart disease Paternal Uncle   . Stroke Paternal Grandfather     ALLERGIES:  has No Known Allergies.  MEDICATIONS:  Current Outpatient Prescriptions  Medication Sig Dispense Refill  . alendronate (FOSAMAX) 70 MG tablet     . ALPRAZolam (XANAX) 0.5 MG tablet     . CRESTOR 40 MG tablet     . cyclobenzaprine (FLEXERIL) 10 MG tablet     . ibuprofen (ADVIL,MOTRIN) 800 MG tablet     . medroxyPROGESTERone (PROVERA) 2.5 MG tablet     . metoprolol succinate (TOPROL-XL) 50 MG 24 hr tablet     . pantoprazole (PROTONIX) 40 MG tablet Take 40 mg by mouth daily.    Marland Kitchen  PREMARIN 0.3 MG tablet     . PROAIR HFA 108 (90 Base) MCG/ACT inhaler     . QUEtiapine (SEROQUEL) 50 MG tablet     . SYMBICORT 160-4.5 MCG/ACT inhaler      No current facility-administered medications for this visit.       Marland Kitchen  PHYSICAL EXAMINATION: ECOG PERFORMANCE STATUS: 0 - Asymptomatic Vitals:   08/28/17 1506  BP: 101/71  Pulse: 97  Temp: 99 F (37.2 C)   Filed Weights   08/28/17 1506  Weight: 134 lb 1 oz (60.8 kg)     LABORATORY DATA:  I  have reviewed the data as listed Lab Results  Component Value Date   WBC 15.3 (H) 08/27/2016   HGB 14.4 08/27/2016   HCT 41.4 08/27/2016   MCV 89.9 08/27/2016   PLT 210 08/27/2016   No results for input(s): NA, K, CL, CO2, GLUCOSE, BUN, CREATININE, CALCIUM, GFRNONAA, GFRAA, PROT, ALBUMIN, AST, ALT, ALKPHOS, BILITOT, BILIDIR, IBILI in the last 8760 hours.  RADIOGRAPHIC STUDIES: I have personally reviewed the radiological images as listed and agreed with the findings in the report. Mammogram 10/5/208 IMPRESSION: 1. No abnormalities are identified on the diagnostic imaging of the left breast.   ASSESSMENT & PLAN:  1. Thrombocytopenia ([place])   2. Chronic hepatitis C without hepatic coma ([place])    For the work up of patient's thrombocytopenia, I recommend checking CBC;CMP, LDH;  folate, Vitamin B12, hepatitis, HIV and monoclonal gammopathy workup. Will also check ultrasound of the abdomen.  Also, discussed with the patient that if no clear etiology found- bone marrow biopsy would be suggested. Currently await for the above workup.   # Patient follow-up with me in approximately 2-3 weeks to review the above results. All questions were answered. The patient knows to call the clinic with any problems questions or concerns.  Thank you for this kind referral and the opportunity to participate in the care of this patient. A copy of today's note is routed to referring provider    Earlie Server, MD, PhD Hematology Oncology [facility] at [facility] Pager- [phone] 08/28/2017

## 2017-08-29 ENCOUNTER — Encounter: Payer: Self-pay | Admitting: Oncology

## 2017-08-29 LAB — HEPATITIS B SURFACE ANTIGEN: HEP B S AG: NEGATIVE

## 2017-08-29 LAB — KAPPA/LAMBDA LIGHT CHAINS
KAPPA FREE LGHT CHN: 7.2 mg/L (ref 3.3–19.4)
KAPPA, LAMDA LIGHT CHAIN RATIO: 0.69 (ref 0.26–1.65)
LAMDA FREE LIGHT CHAINS: 10.5 mg/L (ref 5.7–26.3)

## 2017-08-29 LAB — HIV ANTIBODY (ROUTINE TESTING W REFLEX): HIV Screen 4th Generation wRfx: NONREACTIVE

## 2017-08-29 LAB — HEPATITIS C ANTIBODY (REFLEX): HCV Ab: >11 s/co ratio — ABNORMAL HIGH (ref 0.0–0.9)

## 2017-08-29 LAB — PROTEIN ELECTROPHORESIS, SERUM
A/G RATIO SPE: 1.1 (ref 0.7–1.7)
ALPHA-1-GLOBULIN: 0.3 g/dL (ref 0.0–0.4)
Albumin ELP: 3.6 g/dL (ref 2.9–4.4)
Alpha-2-Globulin: 1.2 g/dL — ABNORMAL HIGH (ref 0.4–1.0)
Beta Globulin: 1.2 g/dL (ref 0.7–1.3)
GLOBULIN, TOTAL: 3.4 g/dL (ref 2.2–3.9)
Gamma Globulin: 0.7 g/dL (ref 0.4–1.8)
TOTAL PROTEIN ELP: 7 g/dL (ref 6.0–8.5)

## 2017-08-29 LAB — COMMENT2 - HEP PANEL

## 2017-08-31 LAB — IMMUNOFIXATION ELECTROPHORESIS
IGA: 100 mg/dL (ref 87–352)
IGG (IMMUNOGLOBIN G), SERUM: 632 mg/dL — AB (ref 700–1600)
IgM (Immunoglobulin M), Srm: 51 mg/dL (ref 26–217)
Total Protein ELP: 7.1 g/dL (ref 6.0–8.5)

## 2017-09-16 ENCOUNTER — Emergency Department: Payer: Medicare Other

## 2017-09-16 ENCOUNTER — Encounter: Payer: Self-pay | Admitting: Emergency Medicine

## 2017-09-16 ENCOUNTER — Inpatient Hospital Stay
Admission: EM | Admit: 2017-09-16 | Discharge: 2017-09-18 | DRG: 190 | Disposition: A | Discharge status: Home / Self Care | Payer: Medicare Other | Attending: Internal Medicine | Admitting: Internal Medicine

## 2017-09-16 DIAGNOSIS — Z23 Encounter for immunization: Secondary | ICD-10-CM

## 2017-09-16 DIAGNOSIS — Z79890 Hormone replacement therapy (postmenopausal): Secondary | ICD-10-CM

## 2017-09-16 DIAGNOSIS — J9622 Acute and chronic respiratory failure with hypercapnia: Secondary | ICD-10-CM

## 2017-09-16 DIAGNOSIS — Z7983 Long term (current) use of bisphosphonates: Secondary | ICD-10-CM

## 2017-09-16 DIAGNOSIS — R0602 Shortness of breath: Secondary | ICD-10-CM | POA: Diagnosis not present

## 2017-09-16 DIAGNOSIS — F17210 Nicotine dependence, cigarettes, uncomplicated: Secondary | ICD-10-CM | POA: Diagnosis present

## 2017-09-16 DIAGNOSIS — J440 Chronic obstructive pulmonary disease with acute lower respiratory infection: Secondary | ICD-10-CM | POA: Diagnosis present

## 2017-09-16 DIAGNOSIS — F329 Major depressive disorder, single episode, unspecified: Secondary | ICD-10-CM | POA: Diagnosis present

## 2017-09-16 DIAGNOSIS — Z7951 Long term (current) use of inhaled steroids: Secondary | ICD-10-CM

## 2017-09-16 DIAGNOSIS — J441 Chronic obstructive pulmonary disease with (acute) exacerbation: Secondary | ICD-10-CM | POA: Diagnosis not present

## 2017-09-16 DIAGNOSIS — Z9981 Dependence on supplemental oxygen: Secondary | ICD-10-CM

## 2017-09-16 DIAGNOSIS — K219 Gastro-esophageal reflux disease without esophagitis: Secondary | ICD-10-CM | POA: Diagnosis present

## 2017-09-16 DIAGNOSIS — J9621 Acute and chronic respiratory failure with hypoxia: Secondary | ICD-10-CM

## 2017-09-16 DIAGNOSIS — I10 Essential (primary) hypertension: Secondary | ICD-10-CM | POA: Diagnosis present

## 2017-09-16 DIAGNOSIS — E785 Hyperlipidemia, unspecified: Secondary | ICD-10-CM | POA: Diagnosis present

## 2017-09-16 DIAGNOSIS — Z8249 Family history of ischemic heart disease and other diseases of the circulatory system: Secondary | ICD-10-CM | POA: Diagnosis not present

## 2017-09-16 DIAGNOSIS — E876 Hypokalemia: Secondary | ICD-10-CM | POA: Diagnosis present

## 2017-09-16 DIAGNOSIS — J209 Acute bronchitis, unspecified: Secondary | ICD-10-CM | POA: Diagnosis present

## 2017-09-16 DIAGNOSIS — F419 Anxiety disorder, unspecified: Secondary | ICD-10-CM | POA: Diagnosis present

## 2017-09-16 DIAGNOSIS — Z79899 Other long term (current) drug therapy: Secondary | ICD-10-CM

## 2017-09-16 HISTORY — DX: Acute and chronic respiratory failure with hypoxia: J96.21

## 2017-09-16 LAB — CBC WITH DIFFERENTIAL/PLATELET
BASOS ABS: 0.1 10*3/uL (ref 0–0.1)
BASOS PCT: 1 %
EOS ABS: 0.6 10*3/uL (ref 0–0.7)
Eosinophils Relative: 5 %
HCT: 43.7 % (ref 35.0–47.0)
HEMOGLOBIN: 14.6 g/dL (ref 12.0–16.0)
Lymphocytes Relative: 15 %
Lymphs Abs: 1.7 10*3/uL (ref 1.0–3.6)
MCH: 30.8 pg (ref 26.0–34.0)
MCHC: 33.5 g/dL (ref 32.0–36.0)
MCV: 92.1 fL (ref 80.0–100.0)
Monocytes Absolute: 0.3 10*3/uL (ref 0.2–0.9)
Monocytes Relative: 3 %
NEUTROS PCT: 76 %
Neutro Abs: 8.5 10*3/uL — ABNORMAL HIGH (ref 1.4–6.5)
Platelets: 218 10*3/uL (ref 150–440)
RBC: 4.74 MIL/uL (ref 3.80–5.20)
RDW: 14 % (ref 11.5–14.5)
WBC: 11.2 10*3/uL — AB (ref 3.6–11.0)

## 2017-09-16 LAB — COMPREHENSIVE METABOLIC PANEL
ALBUMIN: 4.2 g/dL (ref 3.5–5.0)
ALK PHOS: 59 U/L (ref 38–126)
ALT: 12 U/L — AB (ref 14–54)
ANION GAP: 7 (ref 5–15)
AST: 20 U/L (ref 15–41)
BUN: 8 mg/dL (ref 6–20)
CALCIUM: 9 mg/dL (ref 8.9–10.3)
CO2: 27 mmol/L (ref 22–32)
Chloride: 106 mmol/L (ref 101–111)
Creatinine, Ser: 0.79 mg/dL (ref 0.44–1.00)
GFR calc Af Amer: >60 mL/min (ref >60)
GFR calc non Af Amer: >60 mL/min (ref >60)
Glucose, Bld: 131 mg/dL — ABNORMAL HIGH (ref 65–99)
Potassium: 3.7 mmol/L (ref 3.5–5.1)
SODIUM: 140 mmol/L (ref 135–145)
Total Bilirubin: 0.7 mg/dL (ref 0.3–1.2)
Total Protein: 7.2 g/dL (ref 6.5–8.1)

## 2017-09-16 LAB — MAGNESIUM: Magnesium: 1.9 mg/dL (ref 1.7–2.4)

## 2017-09-16 LAB — TROPONIN I

## 2017-09-16 MED ORDER — IPRATROPIUM-ALBUTEROL 0.5-2.5 (3) MG/3ML IN SOLN
3.0000 mL | Freq: Once | RESPIRATORY_TRACT | Status: AC
  Administered 2017-09-16: 3 mL via RESPIRATORY_TRACT

## 2017-09-16 MED ORDER — CYCLOBENZAPRINE HCL 10 MG PO TABS
10.0000 mg | ORAL_TABLET | Freq: Every day | ORAL | Status: DC
  Administered 2017-09-16 – 2017-09-17 (×2): 10 mg via ORAL
  Filled 2017-09-16 (×2): qty 1

## 2017-09-16 MED ORDER — ALENDRONATE SODIUM 70 MG PO TABS: 70.0000 mg | ORAL_TABLET | ORAL | Status: DC

## 2017-09-16 MED ORDER — ALBUTEROL SULFATE (2.5 MG/3ML) 0.083% IN NEBU: 5.0000 mg | INHALATION_SOLUTION | Freq: Once | RESPIRATORY_TRACT | Status: DC

## 2017-09-16 MED ORDER — IBUPROFEN 400 MG PO TABS: 800.0000 mg | ORAL_TABLET | Freq: Three times a day (TID) | ORAL | Status: DC | PRN

## 2017-09-16 MED ORDER — PNEUMOCOCCAL VAC POLYVALENT 25 MCG/0.5ML IJ INJ
0.5000 mL | INJECTION | INTRAMUSCULAR | Status: AC
  Administered 2017-09-17: 0.5 mL via INTRAMUSCULAR
  Filled 2017-09-16: qty 0.5

## 2017-09-16 MED ORDER — METHYLPREDNISOLONE SODIUM SUCC 125 MG IJ SOLR
125.0000 mg | Freq: Once | INTRAMUSCULAR | Status: AC
  Administered 2017-09-16: 125 mg via INTRAVENOUS

## 2017-09-16 MED ORDER — MOMETASONE FURO-FORMOTEROL FUM 200-5 MCG/ACT IN AERO
2.0000 | INHALATION_SPRAY | Freq: Two times a day (BID) | RESPIRATORY_TRACT | Status: DC
  Administered 2017-09-16 – 2017-09-18 (×5): 2 via RESPIRATORY_TRACT
  Filled 2017-09-16: qty 8.8

## 2017-09-16 MED ORDER — METHYLPREDNISOLONE SODIUM SUCC 125 MG IJ SOLR
INTRAMUSCULAR | Status: AC
  Filled 2017-09-16: qty 2

## 2017-09-16 MED ORDER — QUETIAPINE FUMARATE 25 MG PO TABS
50.0000 mg | ORAL_TABLET | Freq: Every day | ORAL | Status: DC
Start: 2017-09-16 — End: 2017-09-18
  Administered 2017-09-16 – 2017-09-17 (×2): 50 mg via ORAL
  Filled 2017-09-16 (×2): qty 2

## 2017-09-16 MED ORDER — HEPARIN SODIUM (PORCINE) 5000 UNIT/ML IJ SOLN
5000.0000 [IU] | Freq: Three times a day (TID) | INTRAMUSCULAR | Status: DC
  Administered 2017-09-16 – 2017-09-18 (×6): 5000 [IU] via SUBCUTANEOUS
  Filled 2017-09-16 (×6): qty 1

## 2017-09-16 MED ORDER — ROSUVASTATIN CALCIUM 20 MG PO TABS
40.0000 mg | ORAL_TABLET | Freq: Every day | ORAL | Status: DC
  Administered 2017-09-16 – 2017-09-18 (×3): 40 mg via ORAL
  Filled 2017-09-16 (×3): qty 2

## 2017-09-16 MED ORDER — ALPRAZOLAM 0.5 MG PO TABS
0.5000 mg | ORAL_TABLET | Freq: Three times a day (TID) | ORAL | Status: DC | PRN
  Administered 2017-09-16 – 2017-09-18 (×5): 0.5 mg via ORAL
  Filled 2017-09-16 (×5): qty 1

## 2017-09-16 MED ORDER — INFLUENZA VAC SPLIT QUAD 0.5 ML IM SUSY
0.5000 mL | PREFILLED_SYRINGE | INTRAMUSCULAR | Status: AC
  Administered 2017-09-17: 10:00:00 0.5 mL via INTRAMUSCULAR
  Filled 2017-09-16: qty 0.5

## 2017-09-16 MED ORDER — IPRATROPIUM-ALBUTEROL 0.5-2.5 (3) MG/3ML IN SOLN
3.0000 mL | Freq: Four times a day (QID) | RESPIRATORY_TRACT | Status: DC
  Administered 2017-09-16 – 2017-09-18 (×9): 3 mL via RESPIRATORY_TRACT
  Filled 2017-09-16 (×9): qty 3

## 2017-09-16 MED ORDER — DOCUSATE SODIUM 100 MG PO CAPS: 100.0000 mg | ORAL_CAPSULE | Freq: Two times a day (BID) | ORAL | Status: DC | PRN

## 2017-09-16 MED ORDER — BUDESONIDE 0.25 MG/2ML IN SUSP
0.2500 mg | Freq: Two times a day (BID) | RESPIRATORY_TRACT | Status: DC
  Administered 2017-09-16 – 2017-09-18 (×5): 0.25 mg via RESPIRATORY_TRACT
  Filled 2017-09-16 (×5): qty 2

## 2017-09-16 MED ORDER — IPRATROPIUM-ALBUTEROL 0.5-2.5 (3) MG/3ML IN SOLN: 3.0000 mL | RESPIRATORY_TRACT | Status: DC | PRN

## 2017-09-16 MED ORDER — IPRATROPIUM-ALBUTEROL 0.5-2.5 (3) MG/3ML IN SOLN
3.0000 mL | RESPIRATORY_TRACT | Status: DC
  Administered 2017-09-16: 09:00:00 3 mL via RESPIRATORY_TRACT
  Filled 2017-09-16: qty 3

## 2017-09-16 MED ORDER — METOPROLOL SUCCINATE ER 50 MG PO TB24
50.0000 mg | ORAL_TABLET | Freq: Every day | ORAL | Status: DC
  Administered 2017-09-16 – 2017-09-18 (×3): 50 mg via ORAL
  Filled 2017-09-16 (×3): qty 1

## 2017-09-16 MED ORDER — ESTROGENS CONJUGATED 0.3 MG PO TABS
0.3000 mg | ORAL_TABLET | Freq: Every day | ORAL | Status: DC
  Administered 2017-09-16 – 2017-09-18 (×3): 0.3 mg via ORAL
  Filled 2017-09-16 (×3): qty 1

## 2017-09-16 MED ORDER — IPRATROPIUM-ALBUTEROL 0.5-2.5 (3) MG/3ML IN SOLN
RESPIRATORY_TRACT | Status: AC
  Filled 2017-09-16: qty 3

## 2017-09-16 MED ORDER — LEVOFLOXACIN 500 MG PO TABS
500.0000 mg | ORAL_TABLET | Freq: Every day | ORAL | Status: DC
  Administered 2017-09-16 – 2017-09-18 (×3): 500 mg via ORAL
  Filled 2017-09-16 (×3): qty 1

## 2017-09-16 MED ORDER — TIOTROPIUM BROMIDE MONOHYDRATE 18 MCG IN CAPS
18.0000 ug | ORAL_CAPSULE | Freq: Every day | RESPIRATORY_TRACT | Status: DC
  Administered 2017-09-16 – 2017-09-18 (×3): 18 ug via RESPIRATORY_TRACT
  Filled 2017-09-16: qty 5

## 2017-09-16 MED ORDER — PANTOPRAZOLE SODIUM 40 MG PO TBEC
40.0000 mg | DELAYED_RELEASE_TABLET | Freq: Every day | ORAL | Status: DC
  Administered 2017-09-16 – 2017-09-18 (×3): 40 mg via ORAL
  Filled 2017-09-16 (×3): qty 1

## 2017-09-16 MED ORDER — METHYLPREDNISOLONE SODIUM SUCC 125 MG IJ SOLR
60.0000 mg | Freq: Four times a day (QID) | INTRAMUSCULAR | Status: DC
  Administered 2017-09-16 – 2017-09-17 (×4): 60 mg via INTRAVENOUS
  Filled 2017-09-16 (×4): qty 2

## 2017-09-16 NOTE — ED Triage Notes (Signed)
Patient arrived from home via EMS with co SOB over the last week that got worse tonight, prompting her to call FD/EMS.  FD found patient with wheezes and initial sats in 80's they could hear from the door, and the put her on NRB @ 10L and corrected to the low 90's.  EMS gave 1 albuterol, 2 duo-nebs, and 125 IM Solu Medrol, since they had an unsuccessful IV attempt.  EMS states she had broken right rib last week and was sent home with prednisone.  Patient AOx4 and answering questions appropriately.  Patient states "I quit smoking tonight"

## 2017-09-16 NOTE — H&P (Signed)
[facility]   PATIENT NAME: Krystal Lee    MR#:  [id number]  DATE OF BIRTH:  1956/04/30  DATE OF ADMISSION:  09/16/2017  PRIMARY CARE PHYSICIAN: Armando Gang, FNP   REQUESTING/REFERRING PHYSICIAN: York Cerise  CHIEF COMPLAINT:   Chief Complaint  Patient presents with  . Shortness of Breath    HISTORY OF PRESENT ILLNESS: Krystal Lee  is a 61 y.o. female with a known history of COPD, Active smoker, on 2 ltr oxygen at home, Htn- Had rib fracture 2 weeks ago and some SOB- given Tapering steroids and pain meds. For last one week again started worsening SOB and cough with White thick sputum. Came to ER. noted to have hypoxia and some Hy[percapneic and gien multiple nebs, still require higher than her baseline oxygen.  PAST MEDICAL HISTORY:   Past Medical History:  Diagnosis Date  . Anxiety   . COPD (chronic obstructive pulmonary disease) (HCC)   . Depression   . GERD (gastroesophageal reflux disease)   . Hepatitis C virus   . Hypertension   . Post-menopausal     PAST SURGICAL HISTORY: Past Surgical History:  Procedure Laterality Date  . CATARACT EXTRACTION  2010   right     SOCIAL HISTORY:  Social History  Substance Use Topics  . Smoking status: Current Every Day Smoker    Packs/day: 1.00    Years: 43.00    Types: Cigarettes  . Smokeless tobacco: Never Used  . Alcohol use 0.6 oz/week    1 Cans of beer per week    FAMILY HISTORY:  Family History  Problem Relation Age of Onset  . Breast cancer Paternal Grandmother   . Bone cancer Paternal Grandmother   . Pancreatic cancer Mother   . Heart disease Father   . Heart disease Paternal Uncle   . Stroke Paternal Grandfather     DRUG ALLERGIES: No Known Allergies  REVIEW OF SYSTEMS:   CONSTITUTIONAL: No fever, fatigue or weakness.  EYES: No blurred or double vision.  EARS, NOSE, AND THROAT: No tinnitus or ear pain.  RESPIRATORY: positive for cough, shortness of breath,  wheezing , no hemoptysis.  CARDIOVASCULAR: No chest pain, orthopnea, edema.  GASTROINTESTINAL: No nausea, vomiting, diarrhea or abdominal pain.  GENITOURINARY: No dysuria, hematuria.  ENDOCRINE: No polyuria, nocturia,  HEMATOLOGY: No anemia, easy bruising or bleeding SKIN: No rash or lesion. MUSCULOSKELETAL: No joint pain or arthritis.   NEUROLOGIC: No tingling, numbness, weakness.  PSYCHIATRY: No anxiety or depression.   MEDICATIONS AT HOME:  Prior to Admission medications   Medication Sig Start Date End Date Taking? Authorizing Provider  alendronate (FOSAMAX) 70 MG tablet Take 70 mg by mouth once a week.  08/17/17  Yes [provider]  ALPRAZolam Prudy Feeler) 0.5 MG tablet Take 0.5 mg by mouth 4 (four) times daily as needed.  08/08/17  Yes [provider]  CRESTOR 40 MG tablet Take 40 mg by mouth daily.  08/17/17  Yes [provider]  cyclobenzaprine (FLEXERIL) 10 MG tablet Take 10 mg by mouth at bedtime.  08/22/17  Yes [provider]  medroxyPROGESTERone (PROVERA) 2.5 MG tablet Only take 5 days at the end of the month. 08/17/17  Yes [provider]  metoprolol succinate (TOPROL-XL) 50 MG 24 hr tablet Take 50 mg by mouth daily.  08/22/17  Yes [provider]  pantoprazole (PROTONIX) 40 MG tablet Take 40 mg by mouth daily. 08/17/17  Yes [provider]  PREMARIN 0.3  MG tablet Take 0.3 mg by mouth daily.  08/17/17  Yes [provider]  PROAIR HFA 108 (90 Base) MCG/ACT inhaler Inhale 1-2 puffs into the lungs every 6 (six) hours as needed.  08/17/17  Yes [provider]  QUEtiapine (SEROQUEL) 50 MG tablet Take 50 mg by mouth daily.  08/17/17  Yes [provider]  SYMBICORT 160-4.5 MCG/ACT inhaler Inhale 2 puffs into the lungs 2 (two) times daily.  08/17/17  Yes [provider]  ibuprofen (ADVIL,MOTRIN) 800 MG tablet Take 800 mg by mouth every 8 (eight) hours as needed.  08/17/17   [provider]       PHYSICAL EXAMINATION:   VITAL SIGNS: Blood pressure 128/88, pulse 80, temperature 97.8 F (36.6 C), resp. rate (!) 32, SpO2 92 %.  GENERAL:  61 y.o.-year-old patient lying in the bed with no acute distress.  EYES: Pupils equal, round, reactive to light and accommodation. No scleral icterus. Extraocular muscles intact.  HEENT: Head atraumatic, normocephalic. Oropharynx and nasopharynx clear.  NECK:  Supple, no jugular venous distention. No thyroid enlargement, no tenderness.  LUNGS: Normal breath sounds bilaterally, have some wheezing, no crepitation. No use of accessory muscles of respiration.  CARDIOVASCULAR: S1, S2 normal. No murmurs, rubs, or gallops.  ABDOMEN: Soft, nontender, nondistended. Bowel sounds present. No organomegaly or mass.  EXTREMITIES: No pedal edema, cyanosis, or clubbing.  NEUROLOGIC: Cranial nerves II through XII are intact. Muscle strength 5/5 in all extremities. Sensation intact. Gait not checked.  PSYCHIATRIC: The patient is alert and oriented x 3.  SKIN: No obvious rash, lesion, or ulcer.   LABORATORY PANEL:   CBC  Recent Labs Lab 09/16/17 0610  WBC 11.2*  HGB 14.6  HCT 43.7  PLT 218  MCV 92.1  MCH 30.8  MCHC 33.5  RDW 14.0  LYMPHSABS 1.7  MONOABS 0.3  EOSABS 0.6  BASOSABS 0.1   ------------------------------------------------------------------------------------------------------------------  Chemistries   Recent Labs Lab 09/16/17 0610  NA 140  K 3.7  CL 106  CO2 27  GLUCOSE 131*  BUN 8  CREATININE 0.79  CALCIUM 9.0  MG 1.9  AST 20  ALT 12*  ALKPHOS 59  BILITOT 0.7   ------------------------------------------------------------------------------------------------------------------ CrCl cannot be calculated (Unknown ideal weight.). ------------------------------------------------------------------------------------------------------------------ No results for input(s): TSH, T4TOTAL, T3FREE, THYROIDAB in the last 72  hours.  Invalid input(s): FREET3   Coagulation profile No results for input(s): INR, PROTIME in the last 168 hours. ------------------------------------------------------------------------------------------------------------------- No results for input(s): DDIMER in the last 72 hours. -------------------------------------------------------------------------------------------------------------------  Cardiac Enzymes  Recent Labs Lab 09/16/17 0610  TROPONINI <0.03   ------------------------------------------------------------------------------------------------------------------ Invalid input(s): POCBNP  ---------------------------------------------------------------------------------------------------------------  Urinalysis    Component Value Date/Time   COLORURINE YELLOW (A) 03/31/2015 1553   APPEARANCEUR HAZY (A) 03/31/2015 1553   APPEARANCEUR Clear 10/29/2013 2004   LABSPEC 1.008 03/31/2015 1553   LABSPEC 1.004 10/29/2013 2004   PHURINE 6.0 03/31/2015 1553   GLUCOSEU NEGATIVE 03/31/2015 1553   GLUCOSEU Negative 10/29/2013 2004   HGBUR NEGATIVE 03/31/2015 1553   BILIRUBINUR NEGATIVE 03/31/2015 1553   BILIRUBINUR Negative 10/29/2013 2004   KETONESUR NEGATIVE 03/31/2015 1553   PROTEINUR NEGATIVE 03/31/2015 1553   UROBILINOGEN 1.0 11/07/2013 1053   NITRITE NEGATIVE 03/31/2015 1553   LEUKOCYTESUR 3+ (A) 03/31/2015 1553   LEUKOCYTESUR Negative 10/29/2013 2004     RADIOLOGY: Dg Chest Port 1 View  Result Date: 09/16/2017 CLINICAL DATA:  Shortness of breath for week, worsening tonight. Hypoxia and wheezing. History of COPD. EXAM: PORTABLE CHEST 1 VIEW COMPARISON:  Chest radiograph August 07, 2017 FINDINGS: Cardiomediastinal silhouette is normal. Calcified aortic knob. No pleural effusions or focal consolidations. Mild chronic interstitial changes and increased lung volumes. Biapical pleural thickening. Trachea projects midline and there is no pneumothorax. Soft tissue  planes and included osseous structures are non-suspicious. Old bilateral rib fractures. IMPRESSION: COPD, no acute cardiopulmonary process. Aortic Atherosclerosis (ICD10-I70.0). Electronically Signed   By: Awilda Metroourtnay  Bloomer M.D.   On: 09/16/2017 06:01    EKG: Orders placed or performed during the hospital encounter of 09/16/17  . ED EKG  . ED EKG  . EKG 12-Lead  . EKG 12-Lead    IMPRESSION AND PLAN:  * Ac on ch respi failure with hypoxia and hypercapneic   COPD exacerbation   Ac bronchitis    IV and inhaled steroids,. Nebs and spiriva   Abx   Supplemental oxygen.  * Htn  COnt metoprolol   * Hyperlipidemia   Cont crestor,.  * Anxiety   Cont xanax  * Active smoking   Counceled to quit smoking for 4 min.   All the records are reviewed and case discussed with ED provider. Management plans discussed with the patient, family and they are in agreement.  CODE STATUS: Full. Code Status History    This patient does not have a recorded code status. Please follow your organizational policy for patients in this situation.       TOTAL TIME TAKING CARE OF THIS PATIENT: 50 minutes.    Altamese DillingVACHHANI, Keymani Mclean M.D on 09/16/2017   Between 7am to 6pm - Pager - [phone]  After 6pm go to [email or web address] - password [email or web address]  [facility]  Office  [phone]  CC: Primary care physician; Armando GangLindley, Cheryl P, FNP   Note: This dictation was prepared with Dragon dictation along with smaller phrase technology. Any transcriptional errors that result from this process are unintentional.

## 2017-09-16 NOTE — ED Notes (Signed)
Pt in NAD at time of transfer to floor , pt A&O, RR even and unlabored, pt on 2Lnc at94%

## 2017-09-16 NOTE — ED Provider Notes (Signed)
[facility] Emergency Department Provider Note  ____________________________________________   First MD Initiated Contact with Patient 09/16/17 0522     (approximate)  I have reviewed the triage vital signs and the nursing notes.   HISTORY  Chief Complaint Shortness of Breath    HPI Krystal Lee is a 61 y.o. female with severe COPD on 2 L of oxygen at baseline who presents by EMS for evaluation of gradually worsening shortness of breath over the last week.  Nothing makes it better including her usual nebulizer treatments and exertion makes it worse.  She describes her symptoms as severe.  When EMS arrived her oxygen even though she was using her oxygen by nasal cannula.  She has had a mild occasionally productive cough.  She denies fever/chills, chest pain, nausea, vomiting, abdominal pain.  She continues to smoke cigarettes in spite of her lung disease.  She reports she feels better after breathing treatments.  Past Medical History:  Diagnosis Date  . Anxiety   . COPD (chronic obstructive pulmonary disease) (HCC)   . Depression   . GERD (gastroesophageal reflux disease)   . Hepatitis C virus   . Hypertension   . Post-menopausal     Patient Active Problem List   Diagnosis Date Noted  . Acute on chronic respiratory failure with hypoxia and hypercapnia (HCC) 09/16/2017  . COPD with acute exacerbation (HCC) 09/16/2017    Past Surgical History:  Procedure Laterality Date  . CATARACT EXTRACTION  2010   right     Prior to Admission medications   Medication Sig Start Date End Date Taking? Authorizing Provider  alendronate (FOSAMAX) 70 MG tablet Take 70 mg by mouth once a week.  08/17/17  Yes [provider]  ALPRAZolam Prudy Feeler) 0.5 MG tablet Take 0.5 mg by mouth 4 (four) times daily as needed.  08/08/17  Yes [provider]  CRESTOR 40 MG tablet Take 40 mg by mouth daily.  08/17/17  Yes [provider]  cyclobenzaprine  (FLEXERIL) 10 MG tablet Take 10 mg by mouth at bedtime.  08/22/17  Yes [provider]  medroxyPROGESTERone (PROVERA) 2.5 MG tablet Only take 5 days at the end of the month. 08/17/17  Yes [provider]  metoprolol succinate (TOPROL-XL) 50 MG 24 hr tablet Take 50 mg by mouth daily.  08/22/17  Yes [provider]  pantoprazole (PROTONIX) 40 MG tablet Take 40 mg by mouth daily. 08/17/17  Yes [provider]  PREMARIN 0.3 MG tablet Take 0.3 mg by mouth daily.  08/17/17  Yes [provider]  PROAIR HFA 108 (90 Base) MCG/ACT inhaler Inhale 1-2 puffs into the lungs every 6 (six) hours as needed.  08/17/17  Yes [provider]  QUEtiapine (SEROQUEL) 50 MG tablet Take 50 mg by mouth daily.  08/17/17  Yes [provider]  SYMBICORT 160-4.5 MCG/ACT inhaler Inhale 2 puffs into the lungs 2 (two) times daily.  08/17/17  Yes [provider]  ibuprofen (ADVIL,MOTRIN) 800 MG tablet Take 800 mg by mouth every 8 (eight) hours as needed.  08/17/17   [provider]    Allergies Patient has no known allergies.  Family History  Problem Relation Age of Onset  . Breast cancer Paternal Grandmother   . Bone cancer Paternal Grandmother   . Pancreatic cancer Mother   . Heart disease Father   . Heart disease Paternal Uncle   . Stroke Paternal Grandfather     Social History Social History  Substance Use Topics  . Smoking status: Current Every Day Smoker    Packs/day: 1.00    Years: 43.00    Types: Cigarettes  . Smokeless tobacco: Never Used  . Alcohol use 0.6 oz/week    1 Cans of beer per week    Review of Systems Constitutional: No fever/chills Eyes: No visual changes. ENT: No sore throat. Cardiovascular: Denies chest pain. Respiratory: Gradually worsening SOB and wheezing x 1 week Gastrointestinal: No abdominal pain.  No nausea, no vomiting.  No diarrhea.  No constipation. Genitourinary: Negative for dysuria. Musculoskeletal:  Negative for neck pain.  Negative for back pain. Integumentary: Negative for rash. Neurological: Negative for headaches, focal weakness or numbness.   ____________________________________________   PHYSICAL EXAM:  VITAL SIGNS: ED Triage Vitals  Enc Vitals Group     BP 09/16/17 0449 133/84     Pulse Rate 09/16/17 0449 86     Resp 09/16/17 0449 (!) 21     Temp 09/16/17 0449 97.8 F (36.6 C)     Temp src --      SpO2 09/16/17 0449 98 %     Weight --      Height --      Head Circumference --      Peak Flow --      Pain Score 09/16/17 0447 8     Pain Loc --      Pain Edu? --      Excl. in GC? --     Constitutional: Alert and oriented.  Appears chronically ill with acute mild to moderate respiratory distress Eyes: Conjunctivae are normal.  Head: Atraumatic. Nose: No congestion/rhinnorhea. Mouth/Throat: Mucous membranes are moist. Neck: No stridor.  No meningeal signs.   Cardiovascular: Normal rate, regular rhythm. Good peripheral circulation. Grossly normal heart sounds. Respiratory: Increased work of breathing with tachypnea and some accessory muscle use.  Coarse breath sounds throughout with both inspiratory and expiratory wheezing Gastrointestinal: Soft and nontender. No distention.  Musculoskeletal: No lower extremity tenderness nor edema. No gross deformities of extremities. Neurologic:  Normal speech and language. No gross focal neurologic deficits are appreciated.  Skin:  Skin is warm, dry and intact. No rash noted. Psychiatric: Mood and affect are normal. Speech and behavior are normal.  ____________________________________________   LABS (all labs ordered are listed, but only abnormal results are displayed)  Labs Reviewed  CBC WITH DIFFERENTIAL/PLATELET - Abnormal; Notable for the following:       Result Value   WBC 11.2 (*)    Neutro Abs 8.5 (*)    All other components within normal limits  COMPREHENSIVE METABOLIC PANEL - Abnormal; Notable for the following:     Glucose, Bld 131 (*)    ALT 12 (*)    All other components within normal limits  BLOOD GAS, VENOUS - Abnormal; Notable for the following:    pCO2, Ven 61 (*)    Bicarbonate 30.0 (*)    All other components within normal limits  TROPONIN I  MAGNESIUM   ____________________________________________  EKG  ED ECG REPORT I, Kairee Isa, the attending physician, personally viewed and interpreted this ECG.  Date: 09/16/2017 EKG Time: 4:51 AM Rate: 81 Rhythm: normal sinus rhythm QRS Axis: normal Intervals: normal ST/T Wave abnormalities: Non-specific ST segment / T-wave changes, but no evidence of acute ischemia. Narrative Interpretation: no evidence of acute ischemia   ____________________________________________  RADIOLOGY   Dg Chest Port 1 View  Result Date: 09/16/2017 CLINICAL DATA:  Shortness of breath for week, worsening  tonight. Hypoxia and wheezing. History of COPD. EXAM: PORTABLE CHEST 1 VIEW COMPARISON:  Chest radiograph August 07, 2017 FINDINGS: Cardiomediastinal silhouette is normal. Calcified aortic knob. No pleural effusions or focal consolidations. Mild chronic interstitial changes and increased lung volumes. Biapical pleural thickening. Trachea projects midline and there is no pneumothorax. Soft tissue planes and included osseous structures are non-suspicious. Old bilateral rib fractures. IMPRESSION: COPD, no acute cardiopulmonary process. Aortic Atherosclerosis (ICD10-I70.0). Electronically Signed   By: Awilda Metro M.D.   On: 09/16/2017 06:01    ____________________________________________   PROCEDURES  Critical Care performed: No   Procedure(s) performed:   Procedures   ____________________________________________   INITIAL IMPRESSION / ASSESSMENT AND PLAN / ED COURSE  As part of my medical decision making, I reviewed the following data within the electronic MEDICAL RECORD NUMBER History obtained from family, Labs reviewed , Old chart  reviewed, Radiograph reviewed , Discussed with admitting physician  and Notes from prior ED visits    Differential includes, but is not limited to, viral syndrome, bronchitis including COPD exacerbation, pneumonia, reactive airway disease including asthma, CHF including exacerbation with or without pulmonary/interstitial edema, pneumothorax, ACS, thoracic trauma, and pulmonary embolism.  Symptoms are not consistent with ACS and she has no history of CHF.  No indication of pneumonia on chest x-ray.  By far the most likely diagnosis is COPD exacerbation, or more specifically, acute on chronic respiratory failure with hypoxia and hypercapnia.  Labs are notable for a notable opponent, normal CMP, essentially normal CBC with only a slight leukocytosis, but a VBG indicating hypercapnia.  The patient was at rest on 3-4 L of oxygen and had an oxygen saturation of 89% and continues to have tachypnea.  This is after at least 4 breathing treatments.  She also got a dose of both intramuscular and IV Solu-Medrol.  I discussed it with her and she does not typically use BiPAP.  Given that she states she feels better and is not retracting I will not order BiPAP for now, but I discussed it with the hospitalist, Dr. Damian Leavell, who will admit.     ____________________________________________  FINAL CLINICAL IMPRESSION(S) / ED DIAGNOSES  Final diagnoses:  COPD exacerbation (HCC)  Acute on chronic respiratory failure with hypoxia and hypercapnia (HCC)     MEDICATIONS GIVEN DURING THIS VISIT:  Medications  ipratropium-albuterol (DUONEB) 0.5-2.5 (3) MG/3ML nebulizer solution (not administered)  budesonide (PULMICORT) nebulizer solution 0.25 mg (not administered)  ipratropium-albuterol (DUONEB) 0.5-2.5 (3) MG/3ML nebulizer solution 3 mL (not administered)  levofloxacin (LEVAQUIN) tablet 500 mg (not administered)  methylPREDNISolone sodium succinate (SOLU-MEDROL) 125 mg/2 mL injection 60 mg (not administered)    tiotropium (SPIRIVA) inhalation capsule 18 mcg (not administered)  ipratropium-albuterol (DUONEB) 0.5-2.5 (3) MG/3ML nebulizer solution 3 mL (3 mLs Nebulization Given 09/16/17 0508)  methylPREDNISolone sodium succinate (SOLU-MEDROL) 125 mg/2 mL injection 125 mg (125 mg Intravenous Given 09/16/17 0542)     NEW OUTPATIENT MEDICATIONS STARTED DURING THIS VISIT:  New Prescriptions   No medications on file    Modified Medications   No medications on file    Discontinued Medications   No medications on file     Note:  This document was prepared using Dragon voice recognition software and may include unintentional dictation errors.    Loleta Rose, MD 09/16/17 [phone]

## 2017-09-16 NOTE — Progress Notes (Signed)

## 2017-09-17 LAB — BASIC METABOLIC PANEL
Anion gap: 7 (ref 5–15)
BUN: 14 mg/dL (ref 6–20)
CALCIUM: 8.8 mg/dL — AB (ref 8.9–10.3)
CO2: 25 mmol/L (ref 22–32)
CREATININE: 0.63 mg/dL (ref 0.44–1.00)
Chloride: 104 mmol/L (ref 101–111)
GFR calc non Af Amer: >60 mL/min (ref >60)
Glucose, Bld: 145 mg/dL — ABNORMAL HIGH (ref 65–99)
Potassium: 2.9 mmol/L — ABNORMAL LOW (ref 3.5–5.1)
Sodium: 136 mmol/L (ref 135–145)

## 2017-09-17 LAB — CBC
HCT: 42.1 % (ref 35.0–47.0)
Hemoglobin: 14.2 g/dL (ref 12.0–16.0)
MCH: 30.8 pg (ref 26.0–34.0)
MCHC: 33.9 g/dL (ref 32.0–36.0)
MCV: 90.9 fL (ref 80.0–100.0)
PLATELETS: 184 10*3/uL (ref 150–440)
RBC: 4.63 MIL/uL (ref 3.80–5.20)
RDW: 13.8 % (ref 11.5–14.5)
WBC: 15.3 10*3/uL — ABNORMAL HIGH (ref 3.6–11.0)

## 2017-09-17 LAB — MAGNESIUM: MAGNESIUM: 1.8 mg/dL (ref 1.7–2.4)

## 2017-09-17 MED ORDER — METHYLPREDNISOLONE SODIUM SUCC 125 MG IJ SOLR
60.0000 mg | Freq: Two times a day (BID) | INTRAMUSCULAR | Status: DC
  Administered 2017-09-17 – 2017-09-18 (×2): 60 mg via INTRAVENOUS
  Filled 2017-09-17 (×2): qty 2

## 2017-09-17 MED ORDER — GUAIFENESIN-DM 100-10 MG/5ML PO SYRP
5.0000 mL | ORAL_SOLUTION | ORAL | Status: DC | PRN
  Administered 2017-09-17: 10:00:00 5 mL via ORAL
  Filled 2017-09-17 (×2): qty 5

## 2017-09-17 MED ORDER — POTASSIUM CHLORIDE CRYS ER 20 MEQ PO TBCR
40.0000 meq | EXTENDED_RELEASE_TABLET | Freq: Once | ORAL | Status: AC
  Administered 2017-09-17: 08:00:00 40 meq via ORAL
  Filled 2017-09-17: qty 2

## 2017-09-17 MED ORDER — POTASSIUM CHLORIDE CRYS ER 20 MEQ PO TBCR
20.0000 meq | EXTENDED_RELEASE_TABLET | Freq: Two times a day (BID) | ORAL | Status: DC
  Administered 2017-09-17 – 2017-09-18 (×2): 20 meq via ORAL
  Filled 2017-09-17 (×2): qty 1

## 2017-09-17 NOTE — Progress Notes (Signed)
[facility] - [place] at [facility]   PATIENT NAME: Krystal Lee    MR#:  [id number]  DATE OF BIRTH:  06-15-1956  SUBJECTIVE:  CHIEF COMPLAINT:   Chief Complaint  Patient presents with  . Shortness of Breath     Feels little better today, some wheezing. her potassium is low.  REVIEW OF SYSTEMS:  CONSTITUTIONAL: No fever, fatigue or weakness.  EYES: No blurred or double vision.  EARS, NOSE, AND THROAT: No tinnitus or ear pain.  RESPIRATORY: have cough, shortness of breath, wheezing ,no hemoptysis.  CARDIOVASCULAR: No chest pain, orthopnea, edema.  GASTROINTESTINAL: No nausea, vomiting, diarrhea or abdominal pain.  GENITOURINARY: No dysuria, hematuria.  ENDOCRINE: No polyuria, nocturia,  HEMATOLOGY: No anemia, easy bruising or bleeding SKIN: No rash or lesion. MUSCULOSKELETAL: No joint pain or arthritis.   NEUROLOGIC: No tingling, numbness, weakness.  PSYCHIATRY: No anxiety or depression.   ROS  DRUG ALLERGIES:  No Known Allergies  VITALS:  Blood pressure 137/67, pulse 77, temperature 97.7 F (36.5 C), temperature source Oral, resp. rate 16, weight 58.7 kg (129 lb 7 oz), SpO2 99 %.  PHYSICAL EXAMINATION:  GENERAL:  61 y.o.-year-old patient lying in the bed with no acute distress.  EYES: Pupils equal, round, reactive to light and accommodation. No scleral icterus. Extraocular muscles intact.  HEENT: Head atraumatic, normocephalic. Oropharynx and nasopharynx clear.  NECK:  Supple, no jugular venous distention. No thyroid enlargement, no tenderness.  LUNGS: Normal breath sounds bilaterally, have wheezing, no crepitation. No use of accessory muscles of respiration.  CARDIOVASCULAR: S1, S2 normal. No murmurs, rubs, or gallops.  ABDOMEN: Soft, nontender, nondistended. Bowel sounds present. No organomegaly or mass.  EXTREMITIES: No pedal edema, cyanosis, or clubbing.  NEUROLOGIC: Cranial nerves II through XII are intact. Muscle strength 5/5 in all extremities.  Sensation intact. Gait not checked.  PSYCHIATRIC: The patient is alert and oriented x 3.  SKIN: No obvious rash, lesion, or ulcer.   Physical Exam LABORATORY PANEL:   CBC  Recent Labs Lab 09/17/17 0428  WBC 15.3*  HGB 14.2  HCT 42.1  PLT 184   ------------------------------------------------------------------------------------------------------------------  Chemistries   Recent Labs Lab 09/16/17 0610 09/17/17 0428  NA 140 136  K 3.7 2.9*  CL 106 104  CO2 27 25  GLUCOSE 131* 145*  BUN 8 14  CREATININE 0.79 0.63  CALCIUM 9.0 8.8*  MG 1.9 1.8  AST 20  --   ALT 12*  --   ALKPHOS 59  --   BILITOT 0.7  --    ------------------------------------------------------------------------------------------------------------------  Cardiac Enzymes  Recent Labs Lab 09/16/17 0610  TROPONINI <0.03   ------------------------------------------------------------------------------------------------------------------  RADIOLOGY:  Dg Chest Port 1 View  Result Date: 09/16/2017 CLINICAL DATA:  Shortness of breath for week, worsening tonight. Hypoxia and wheezing. History of COPD. EXAM: PORTABLE CHEST 1 VIEW COMPARISON:  Chest radiograph August 07, 2017 FINDINGS: Cardiomediastinal silhouette is normal. Calcified aortic knob. No pleural effusions or focal consolidations. Mild chronic interstitial changes and increased lung volumes. Biapical pleural thickening. Trachea projects midline and there is no pneumothorax. Soft tissue planes and included osseous structures are non-suspicious. Old bilateral rib fractures. IMPRESSION: COPD, no acute cardiopulmonary process. Aortic Atherosclerosis (ICD10-I70.0). Electronically Signed   By: Awilda Metro M.D.   On: 09/16/2017 06:01    ASSESSMENT AND PLAN:   Principal Problem:   Acute on chronic respiratory failure with hypoxia and hypercapnia (HCC) Active Problems:   COPD with acute exacerbation (HCC)   * Ac on ch respi  failure with  hypoxia and hypercapneic   COPD exacerbation   Ac bronchitis    IV and inhaled steroids,. Nebs and spiriva   Abx   Supplemental oxygen.   She is on baseline 2 ltr oxygen now.  * Htn  COnt metoprolol  * Hyperlipidemia   Cont crestor,.  * Anxiety   Cont xanax  * Active smoking   Counceled to quit smoking for 4 min.  * hypokalemia    Replace oral,.  All the records are reviewed and case discussed with Care Management/Social Workerr. Management plans discussed with the patient, family and they are in agreement.  CODE STATUS: Full.  TOTAL TIME TAKING CARE OF THIS PATIENT: 35 minutes.     POSSIBLE D/C IN 1-2 DAYS, DEPENDING ON CLINICAL CONDITION.   Altamese DillingVACHHANI, Elany Felix M.D on 09/17/2017   Between 7am to 6pm - Pager - [phone]  After 6pm go to [email or web address] - password [email or web address]  [facility]  Office  [phone]  CC: Primary care physician; Armando GangLindley, Cheryl P, FNP  Note: This dictation was prepared with Dragon dictation along with smaller phrase technology. Any transcriptional errors that result from this process are unintentional.

## 2017-09-18 LAB — BASIC METABOLIC PANEL
Anion gap: 6 (ref 5–15)
BUN: 16 mg/dL (ref 6–20)
CALCIUM: 8.7 mg/dL — AB (ref 8.9–10.3)
CO2: 24 mmol/L (ref 22–32)
CREATININE: 0.7 mg/dL (ref 0.44–1.00)
Chloride: 107 mmol/L (ref 101–111)
GFR calc non Af Amer: >60 mL/min (ref >60)
Glucose, Bld: 126 mg/dL — ABNORMAL HIGH (ref 65–99)
Potassium: 3.8 mmol/L (ref 3.5–5.1)
SODIUM: 137 mmol/L (ref 135–145)

## 2017-09-18 MED ORDER — PREDNISONE 10 MG (21) PO TBPK: ORAL_TABLET | ORAL | 0 refills | Status: DC

## 2017-09-18 MED ORDER — BUDESONIDE 0.25 MG/2ML IN SUSP: 0.2500 mg | Freq: Two times a day (BID) | RESPIRATORY_TRACT | 0 refills | Status: DC

## 2017-09-18 MED ORDER — TIOTROPIUM BROMIDE MONOHYDRATE 18 MCG IN CAPS: 18.0000 ug | ORAL_CAPSULE | Freq: Every day | RESPIRATORY_TRACT | 0 refills | Status: DC

## 2017-09-18 MED ORDER — GUAIFENESIN-DM 100-10 MG/5ML PO SYRP: 5.0000 mL | ORAL_SOLUTION | ORAL | 0 refills | Status: DC | PRN

## 2017-09-18 MED ORDER — IPRATROPIUM-ALBUTEROL 0.5-2.5 (3) MG/3ML IN SOLN: 3.0000 mL | Freq: Four times a day (QID) | RESPIRATORY_TRACT | 0 refills | Status: DC | PRN

## 2017-09-18 MED ORDER — LEVOFLOXACIN 500 MG PO TABS: 500.0000 mg | ORAL_TABLET | Freq: Every day | ORAL | 0 refills | Status: AC

## 2017-09-18 NOTE — Progress Notes (Signed)
Received MD order to discharge patient to home, reviewed home meds discharge instructions prescriptions and follow up appointments with patient and she verbalized understanding

## 2017-09-18 NOTE — Care Management Note (Signed)
Case Management Note  Patient Details  Name: Krystal Lee MRN: [id number] Date of Birth: Nov 21, 1955    Subjective/Objective:                  Admitted to [facility] with the diagnosis of  Acute/chronic respiratory failure. Lives alone. Did live in a group home 3 years ago. Sister is [place] [facility]. Last seen Franco Nonesheryl Lindley NP September. Sees every 3 months. [facility]. Usually wears at night only, but has been using continuous . Nebulizer and cane in the home. Takes care of all basic activities of daily living herself, doesn't drive. Sister helps with errands. Last fall was 6 weeks ago. Fractured a rib. Decreased appetite since being sick. Lost 5 pounds, Sister will transport  Action/Plan: Will continue to follow for discharge needs   Expected Discharge Date:                  Expected Discharge Plan:     In-House Referral:     Discharge planning Services     Post Acute Care Choice:    Choice offered to:     DME Arranged:    DME Agency:     HH Arranged:    HH Agency:     Status of Service:     If discussed at Long Length of Stay Meetings, dates discussed:    Additional Comments:  Gwenette GreetBrenda S Elyshia Kumagai, RN MSN CCM Care management [phone] 09/18/2017, 8:40 AM

## 2017-09-18 NOTE — Care Management Important Message (Signed)
Important Message  Patient Details  Name: Zoila Shutterrudy Faye Chakraborty MRN: [id number] Date of Birth: Apr 19, 1956   Medicare Important Message Given:  Yes    Gwenette GreetBrenda S Gwendlyon Zumbro, RN 09/18/2017, 7:33 AM

## 2017-09-18 NOTE — Discharge Summary (Signed)
[facility] - [place] at [facility]   PATIENT NAME: Krystal Pentonrudy Copado    MR#:  [id number]  DATE OF BIRTH:  August 11, 1956  DATE OF ADMISSION:  09/16/2017 ADMITTING PHYSICIAN: Altamese DillingVaibhavkumar Kellin Bartling, MD  DATE OF DISCHARGE: 09/18/2017   PRIMARY CARE PHYSICIAN: Armando GangLindley, Cheryl P, FNP    ADMISSION DIAGNOSIS:  SOB (shortness of breath) [R06.02] COPD exacerbation (HCC) [J44.1] Acute on chronic respiratory failure with hypoxia and hypercapnia (HCC) [J96.21, J96.22]  DISCHARGE DIAGNOSIS:  Principal Problem:   Acute on chronic respiratory failure with hypoxia and hypercapnia (HCC) Active Problems:   COPD with acute exacerbation (HCC)   SECONDARY DIAGNOSIS:   Past Medical History:  Diagnosis Date  . Anxiety   . COPD (chronic obstructive pulmonary disease) (HCC)   . Depression   . GERD (gastroesophageal reflux disease)   . Hepatitis C virus   . Hypertension   . Post-menopausal     HOSPITAL COURSE:   * Ac on ch respi failure with hypoxia and hypercapneic COPD exacerbation Ac bronchitis  IV and inhaled steroids,. Nebs and spiriva Abx Supplemental oxygen.   She is on baseline 2 ltr oxygen now.   Feels much better- d/c today.  * Htn COnt metoprolol  * Hyperlipidemia Cont crestor,.  * Anxiety Cont xanax  * Active smoking Counceled to quit smoking for 4 min.  * hypokalemia    Replace oral,.   Normalized now.  DISCHARGE CONDITIONS:   Stable.  CONSULTS OBTAINED:    DRUG ALLERGIES:  No Known Allergies  DISCHARGE MEDICATIONS:   Current Discharge Medication List    START taking these medications   Details  budesonide (PULMICORT) 0.25 MG/2ML nebulizer solution Take 2 mLs (0.25 mg total) by nebulization 2 (two) times daily. Qty: 60 mL, Refills: 0    guaiFENesin-dextromethorphan (ROBITUSSIN DM) 100-10 MG/5ML syrup Take 5 mLs by mouth every 4 (four) hours as needed for cough. Qty: 118 mL, Refills: 0     ipratropium-albuterol (DUONEB) 0.5-2.5 (3) MG/3ML SOLN Take 3 mLs by nebulization every 6 (six) hours as needed. Qty: 360 mL, Refills: 0    levofloxacin (LEVAQUIN) 500 MG tablet Take 1 tablet (500 mg total) by mouth daily. Qty: 3 tablet, Refills: 0    predniSONE (STERAPRED UNI-PAK 21 TAB) 10 MG (21) TBPK tablet Take 6 tabs first day, 5 tab on day 2, then 4 on day 3rd, 3 tabs on day 4th , 2 tab on day 5th, and 1 tab on 6th day. Qty: 21 tablet, Refills: 0    tiotropium (SPIRIVA) 18 MCG inhalation capsule Place 1 capsule (18 mcg total) into inhaler and inhale daily. Qty: 30 capsule, Refills: 0      CONTINUE these medications which have NOT CHANGED   Details  alendronate (FOSAMAX) 70 MG tablet Take 70 mg by mouth once a week.     ALPRAZolam (XANAX) 0.5 MG tablet Take 0.5 mg by mouth 4 (four) times daily as needed.     CRESTOR 40 MG tablet Take 40 mg by mouth daily.     cyclobenzaprine (FLEXERIL) 10 MG tablet Take 10 mg by mouth at bedtime.     medroxyPROGESTERone (PROVERA) 2.5 MG tablet Only take 5 days at the end of the month.    metoprolol succinate (TOPROL-XL) 50 MG 24 hr tablet Take 50 mg by mouth daily.     pantoprazole (PROTONIX) 40 MG tablet Take 40 mg by mouth daily.    PREMARIN 0.3 MG tablet Take 0.3 mg by mouth daily.  PROAIR HFA 108 (90 Base) MCG/ACT inhaler Inhale 1-2 puffs into the lungs every 6 (six) hours as needed.     QUEtiapine (SEROQUEL) 50 MG tablet Take 50 mg by mouth daily.     SYMBICORT 160-4.5 MCG/ACT inhaler Inhale 2 puffs into the lungs 2 (two) times daily.     ibuprofen (ADVIL,MOTRIN) 800 MG tablet Take 800 mg by mouth every 8 (eight) hours as needed.          DISCHARGE INSTRUCTIONS:    Follow with PMD in 1-2 weeks.  If you experience worsening of your admission symptoms, develop shortness of breath, life threatening emergency, suicidal or homicidal thoughts you must seek medical attention immediately by calling 911 or calling your MD  immediately  if symptoms less severe.  You Must read complete instructions/literature along with all the possible adverse reactions/side effects for all the Medicines you take and that have been prescribed to you. Take any new Medicines after you have completely understood and accept all the possible adverse reactions/side effects.   Please note  You were cared for by a hospitalist during your hospital stay. If you have any questions about your discharge medications or the care you received while you were in the hospital after you are discharged, you can call the unit and asked to speak with the hospitalist on call if the hospitalist that took care of you is not available. Once you are discharged, your primary care physician will handle any further medical issues. Please note that NO REFILLS for any discharge medications will be authorized once you are discharged, as it is imperative that you return to your primary care physician (or establish a relationship with a primary care physician if you do not have one) for your aftercare needs so that they can reassess your need for medications and monitor your lab values.    Today   CHIEF COMPLAINT:   Chief Complaint  Patient presents with  . Shortness of Breath    HISTORY OF PRESENT ILLNESS:  Krystal Lee  is a 61 y.o. female with a known history of COPD, Active smoker, on 2 ltr oxygen at home, Htn- Had rib fracture 2 weeks ago and some SOB- given Tapering steroids and pain meds. For last one week again started worsening SOB and cough with White thick sputum. Came to ER. noted to have hypoxia and some Hy[percapneic and gien multiple nebs, still require higher than her baseline oxygen.   VITAL SIGNS:  Blood pressure (!) 149/64, pulse 62, temperature 97.6 F (36.4 C), temperature source Oral, resp. rate 15, weight 58.7 kg (129 lb 7 oz), SpO2 95 %.  I/O:   Intake/Output Summary (Last 24 hours) at 09/18/17 1408 Last data filed at 09/18/17 1408   Gross per 24 hour  Intake             1540 ml  Output                0 ml  Net             1540 ml    PHYSICAL EXAMINATION:   GENERAL:  61 y.o.-year-old patient lying in the bed with no acute distress.  EYES: Pupils equal, round, reactive to light and accommodation. No scleral icterus. Extraocular muscles intact.  HEENT: Head atraumatic, normocephalic. Oropharynx and nasopharynx clear.  NECK:  Supple, no jugular venous distention. No thyroid enlargement, no tenderness.  LUNGS: Normal breath sounds bilaterally, have wheezing, no crepitation. No use of accessory muscles of respiration.  CARDIOVASCULAR: S1, S2 normal. No murmurs, rubs, or gallops.  ABDOMEN: Soft, nontender, nondistended. Bowel sounds present. No organomegaly or mass.  EXTREMITIES: No pedal edema, cyanosis, or clubbing.  NEUROLOGIC: Cranial nerves II through XII are intact. Muscle strength 5/5 in all extremities. Sensation intact. Gait not checked.  PSYCHIATRIC: The patient is alert and oriented x 3.  SKIN: No obvious rash, lesion, or ulcer.   DATA REVIEW:   CBC  Recent Labs Lab 09/17/17 0428  WBC 15.3*  HGB 14.2  HCT 42.1  PLT 184    Chemistries   Recent Labs Lab 09/16/17 0610 09/17/17 0428 09/18/17 0531  NA 140 136 137  K 3.7 2.9* 3.8  CL 106 104 107  CO2 27 25 24   GLUCOSE 131* 145* 126*  BUN 8 14 16   CREATININE 0.79 0.63 0.70  CALCIUM 9.0 8.8* 8.7*  MG 1.9 1.8  --   AST 20  --   --   ALT 12*  --   --   ALKPHOS 59  --   --   BILITOT 0.7  --   --     Cardiac Enzymes  Recent Labs Lab 09/16/17 0610  TROPONINI <0.03    Microbiology Results  Results for orders placed or performed during the hospital encounter of 11/04/13  Culture, Urine     Status: None   Collection Time: 11/07/13 10:53 AM  Result Value Ref Range Status   Specimen Description URINE, RANDOM  Final   Special Requests NONE  Final   Culture  Setup Time   Final    11/08/2013 01:22 Performed at [facility]   Colony  Count NO GROWTH Performed at [facility]  Final   Culture NO GROWTH Performed at [facility]  Final   Report Status 11/09/2013 FINAL  Final    RADIOLOGY:  No results found.  EKG:   Orders placed or performed during the hospital encounter of 09/16/17  . ED EKG  . ED EKG  . EKG 12-Lead  . EKG 12-Lead    Management plans discussed with the patient, family and they are in agreement.  CODE STATUS:     Code Status Orders        Start     Ordered   09/16/17 0835  Full code  Continuous     09/16/17 0834    Code Status History    Date Active Date Inactive Code Status Order ID Comments User Context   This patient has a current code status but no historical code status.      TOTAL TIME TAKING CARE OF THIS PATIENT: 35 minutes.    Altamese Dilling M.D on 09/18/2017 at 2:08 PM  Between 7am to 6pm - Pager - [phone]  After 6pm go to [email or web address] - password [email or web address]  [facility] [place] [facility]  Office  [phone]  CC: Primary care physician; Armando Gang, FNP   Note: This dictation was prepared with Dragon dictation along with smaller phrase technology. Any transcriptional errors that result from this process are unintentional.

## 2017-09-22 NOTE — Progress Notes (Signed)
Hematology/Oncology Consult note [facility] [phone] [phone]   Patient Care Team: Armando GangLindley, Krystal P, FNP as PCP - General (Family Medicine)  REFERRING PROVIDER: Myrle ShengLindley Krystal CHIEF COMPLAINTS/PURPOSE OF CONSULTATION:  thrombocytopenia  HISTORY OF PRESENTING ILLNESS:  Krystal Lee is a 61 y.o.  female with PMH listed below who was referred to me for evaluation of thrombocytopenia.  Patient reports feeling well, without any complaints today. She had labs done with Primary care provider Franco NonesLindley Krystal in May 2018 and also August 2018. Labs in May 2018 showed platelet was clumped. Labs in August 2018 showed borderline hypercalcemia 10.5, mildly high Hb at 15.5, and low platelet at 79,000.  Today Patient reports no new symptoms or complaints during the interval. She is feeling fine today at baseline. Denies any easy bruising, bleeding events, blood in the stool or melena. She tells me that she has chronic hepatitis C infection, due to remote IV drug abuse.   Review of Systems  Constitutional: Negative for fever, night sweats,change in appetite. unintentional weight loss,  HENT: Negative for ear pain, hearing loss, nasal bleeding Eyes: Negative for eye pain, double vision   Respiratory: Negative for wheezing, shortness of breath, cough Cardiovascular: Negative for chest pain, palpitation.   Gastrointestinal: Negative abdominal pain, diarrhea, nausea vomiting Endocrine: Negative  Genitourinary: Negative for dysuria, hematuria, frequency Skin: Negative for rash, iching, bruising Neurological: Negative for headache, dizziness, seizure Hematological: Negative for easy bruising/bleeding, lymph node enlargement Psychiatric/Behavioral: Negative for depression, anxiety, suicidality MEDICAL HISTORY:  Past Medical History:  Diagnosis Date  . Anxiety   . COPD (chronic obstructive pulmonary disease) (HCC)   . Depression   . GERD  (gastroesophageal reflux disease)   . Hepatitis C virus   . Hypertension   . Post-menopausal     SURGICAL HISTORY: Past Surgical History:  Procedure Laterality Date  . CATARACT EXTRACTION  2010   right     SOCIAL HISTORY: Social History   Socioeconomic History  . Marital status: Widowed    Spouse name: Not on file  . Number of children: Not on file  . Years of education: Not on file  . Highest education level: Not on file  Social Needs  . Financial resource strain: Not on file  . Food insecurity - worry: Not on file  . Food insecurity - inability: Not on file  . Transportation needs - medical: Not on file  . Transportation needs - non-medical: Not on file  Occupational History  . Not on file  Tobacco Use  . Smoking status: Current Every Day Smoker    Packs/day: 1.00    Years: 43.00    Pack years: 43.00    Types: Cigarettes  . Smokeless tobacco: Never Used  Substance and Sexual Activity  . Alcohol use: Yes    Alcohol/week: 0.6 oz    Types: 1 Cans of beer per week  . Drug use: No  . Sexual activity: Not on file  Other Topics Concern  . Not on file  Social History Narrative  . Not on file    FAMILY HISTORY: Family History  Problem Relation Age of Onset  . Breast cancer Paternal Grandmother   . Bone cancer Paternal Grandmother   . Pancreatic cancer Mother   . Heart disease Father   . Heart disease Paternal Uncle   . Stroke Paternal Grandfather     ALLERGIES:  has No Known Allergies.  MEDICATIONS:  Current Outpatient Medications  Medication Sig Dispense Refill  . alendronate (  FOSAMAX) 70 MG tablet Take 70 mg by mouth once a week.     . ALPRAZolam (XANAX) 0.5 MG tablet Take 0.5 mg by mouth 4 (four) times daily as needed.     . budesonide (PULMICORT) 0.25 MG/2ML nebulizer solution Take 2 mLs (0.25 mg total) by nebulization 2 (two) times daily. 60 mL 0  . CRESTOR 40 MG tablet Take 40 mg by mouth daily.     . cyclobenzaprine (FLEXERIL) 10 MG tablet Take 10  mg by mouth at bedtime.     Marland Kitchen guaiFENesin-dextromethorphan (ROBITUSSIN DM) 100-10 MG/5ML syrup Take 5 mLs by mouth every 4 (four) hours as needed for cough. 118 mL 0  . ibuprofen (ADVIL,MOTRIN) 800 MG tablet Take 800 mg by mouth every 8 (eight) hours as needed.     Marland Kitchen ipratropium-albuterol (DUONEB) 0.5-2.5 (3) MG/3ML SOLN Take 3 mLs by nebulization every 6 (six) hours as needed. 360 mL 0  . levofloxacin (LEVAQUIN) 500 MG tablet Take 1 tablet (500 mg total) by mouth daily. 3 tablet 0  . medroxyPROGESTERone (PROVERA) 2.5 MG tablet Only take 5 days at the end of the month.    . metoprolol succinate (TOPROL-XL) 50 MG 24 hr tablet Take 50 mg by mouth daily.     . pantoprazole (PROTONIX) 40 MG tablet Take 40 mg by mouth daily.    . predniSONE (STERAPRED UNI-PAK 21 TAB) 10 MG (21) TBPK tablet Take 6 tabs first day, 5 tab on day 2, then 4 on day 3rd, 3 tabs on day 4th , 2 tab on day 5th, and 1 tab on 6th day. 21 tablet 0  . PREMARIN 0.3 MG tablet Take 0.3 mg by mouth daily.     Marland Kitchen PROAIR HFA 108 (90 Base) MCG/ACT inhaler Inhale 1-2 puffs into the lungs every 6 (six) hours as needed.     Marland Kitchen QUEtiapine (SEROQUEL) 50 MG tablet Take 50 mg by mouth daily.     . SYMBICORT 160-4.5 MCG/ACT inhaler Inhale 2 puffs into the lungs 2 (two) times daily.     Marland Kitchen tiotropium (SPIRIVA) 18 MCG inhalation capsule Place 1 capsule (18 mcg total) into inhaler and inhale daily. 30 capsule 0   No current facility-administered medications for this visit.       Marland Kitchen  PHYSICAL EXAMINATION: ECOG PERFORMANCE STATUS: 0 - Asymptomatic Vitals:   09/23/17 1452  BP: (!) 160/85  Pulse: 82  Temp: 99.6 F (37.6 C)   Filed Weights   09/23/17 1452  Weight: 131 lb 4 oz (59.5 kg)     LABORATORY DATA:  I have reviewed the data as listed Lab Results  Component Value Date   WBC 15.3 (H) 09/17/2017   HGB 14.2 09/17/2017   HCT 42.1 09/17/2017   MCV 90.9 09/17/2017   PLT 184 09/17/2017   Recent Labs    08/28/17 1550 09/16/17 0610  09/17/17 0428 09/18/17 0531  NA 137 140 136 137  K 4.2 3.7 2.9* 3.8  CL 103 106 104 107  CO2 22 27 25 24   GLUCOSE 91 131* 145* 126*  BUN 11 8 14 16   CREATININE 0.65 0.79 0.63 0.70  CALCIUM 9.7 9.0 8.8* 8.7*  GFRNONAA >60 >60 >60 >60  GFRAA >60 >60 >60 >60  PROT 7.4 7.2  --   --   ALBUMIN 4.0 4.2  --   --   AST 18 20  --   --   ALT 15 12*  --   --   ALKPHOS 57 59  --   --  BILITOT 0.5 0.7  --   --     RADIOGRAPHIC STUDIES: I have personally reviewed the radiological images as listed and agreed with the findings in the report. US abdomen Questionable nodular thickening of the gallbladder versus artifact from underdistention, less likely adjacent lymph node; recommend RIGHT upper quadrant limited ultrasound following patient being completely NPO for 8 hours to reassess gallbladder wall thickness and exclude focal mass.    ASSESSMENT & PLAN:  1. Thrombocytopenia (HCC)   2. Chronic hepatitis C without hepatic coma (HCC)   3. Leukocytosis, unspecified type    Baseline workup showed that the thrombocytopenia has resolved.she has mild leukocytosis likely due to recent steroid use due to COPD exacerbation.  She is strongly positive for hepatitis C. Abdomen ultrasound was ordered and showed normal spleen size.Questionable nodular thickening of the gallbladder versus artifact from underdistention, less likely adjacent lymph node; ordered RIGHT upper quadrant limited ultrasound following patient being completely NPO for 8 hours to reassess gallbladder wall thickness and exclude focal mass. Patient is advised to strictly follow nothing by mouth instructions prior to the procedure.  Advise patient to establish care with a liver specialist to discuss about potential treatment of hepatitis C. As her thrombocytopenia resolved, she can follow-up as needed if future problems come up. Advise her to continue follow up with primary care physician. All questions were answered. The patient knows to  call the clinic with any problems questions or concerns. Thank you for this kind referral and the opportunity to participate in the care of this patient. A copy of today's note is routed to referring provider    Rickard Patience, MD, PhD Hematology Oncology [facility] Pager- [phone] 09/22/2017

## 2017-09-23 ENCOUNTER — Ambulatory Visit
Admission: RE | Admit: 2017-09-23 | Discharge: 2017-09-23 | Disposition: A | Discharge status: Home / Self Care | Payer: Medicare Other | Source: Ambulatory Visit | Attending: Oncology | Admitting: Oncology

## 2017-09-23 ENCOUNTER — Ambulatory Visit: Payer: Medicare Other

## 2017-09-23 ENCOUNTER — Encounter: Payer: Self-pay | Admitting: Oncology

## 2017-09-23 ENCOUNTER — Inpatient Hospital Stay: Payer: Medicare Other | Attending: Oncology | Admitting: Oncology

## 2017-09-23 VITALS — BP 160/85 | HR 82 | Temp 99.6°F | Wt 131.2 lb

## 2017-09-23 DIAGNOSIS — Z803 Family history of malignant neoplasm of breast: Secondary | ICD-10-CM

## 2017-09-23 DIAGNOSIS — R935 Abnormal findings on diagnostic imaging of other abdominal regions, including retroperitoneum: Secondary | ICD-10-CM

## 2017-09-23 DIAGNOSIS — K219 Gastro-esophageal reflux disease without esophagitis: Secondary | ICD-10-CM | POA: Insufficient documentation

## 2017-09-23 DIAGNOSIS — D696 Thrombocytopenia, unspecified: Secondary | ICD-10-CM | POA: Diagnosis not present

## 2017-09-23 DIAGNOSIS — Z79899 Other long term (current) drug therapy: Secondary | ICD-10-CM

## 2017-09-23 DIAGNOSIS — Z7952 Long term (current) use of systemic steroids: Secondary | ICD-10-CM | POA: Insufficient documentation

## 2017-09-23 DIAGNOSIS — Z800 Family history of malignant neoplasm of digestive organs: Secondary | ICD-10-CM | POA: Diagnosis not present

## 2017-09-23 DIAGNOSIS — D72829 Elevated white blood cell count, unspecified: Secondary | ICD-10-CM | POA: Diagnosis not present

## 2017-09-23 DIAGNOSIS — I10 Essential (primary) hypertension: Secondary | ICD-10-CM | POA: Diagnosis not present

## 2017-09-23 DIAGNOSIS — B182 Chronic viral hepatitis C: Secondary | ICD-10-CM | POA: Insufficient documentation

## 2017-09-23 DIAGNOSIS — R9389 Abnormal findings on diagnostic imaging of other specified body structures: Secondary | ICD-10-CM | POA: Diagnosis not present

## 2017-09-23 DIAGNOSIS — J449 Chronic obstructive pulmonary disease, unspecified: Secondary | ICD-10-CM | POA: Diagnosis not present

## 2017-09-23 DIAGNOSIS — F1911 Other psychoactive substance abuse, in remission: Secondary | ICD-10-CM

## 2017-09-23 DIAGNOSIS — F17210 Nicotine dependence, cigarettes, uncomplicated: Secondary | ICD-10-CM | POA: Diagnosis not present

## 2017-09-23 NOTE — Progress Notes (Signed)
Patient here today for follow up.   

## 2017-09-24 LAB — BLOOD GAS, VENOUS
Acid-Base Excess: 1.7 mmol/L (ref 0.0–2.0)
BICARBONATE: 30 mmol/L — AB (ref 20.0–28.0)
PCO2 VEN: 61 mmHg — AB (ref 44.0–60.0)
PH VEN: 7.3 (ref 7.250–7.430)
Patient temperature: 37

## 2017-11-24 IMAGING — DX DG CHEST 1V PORT
1 series · 1 of 1 positions shown · non-contrast
Comparison: Radiographs and CT 01/20/2015

CLINICAL DATA: Dyspnea, productive cough and wheezing.

EXAM:
PORTABLE CHEST 1 VIEW

[chest ap]
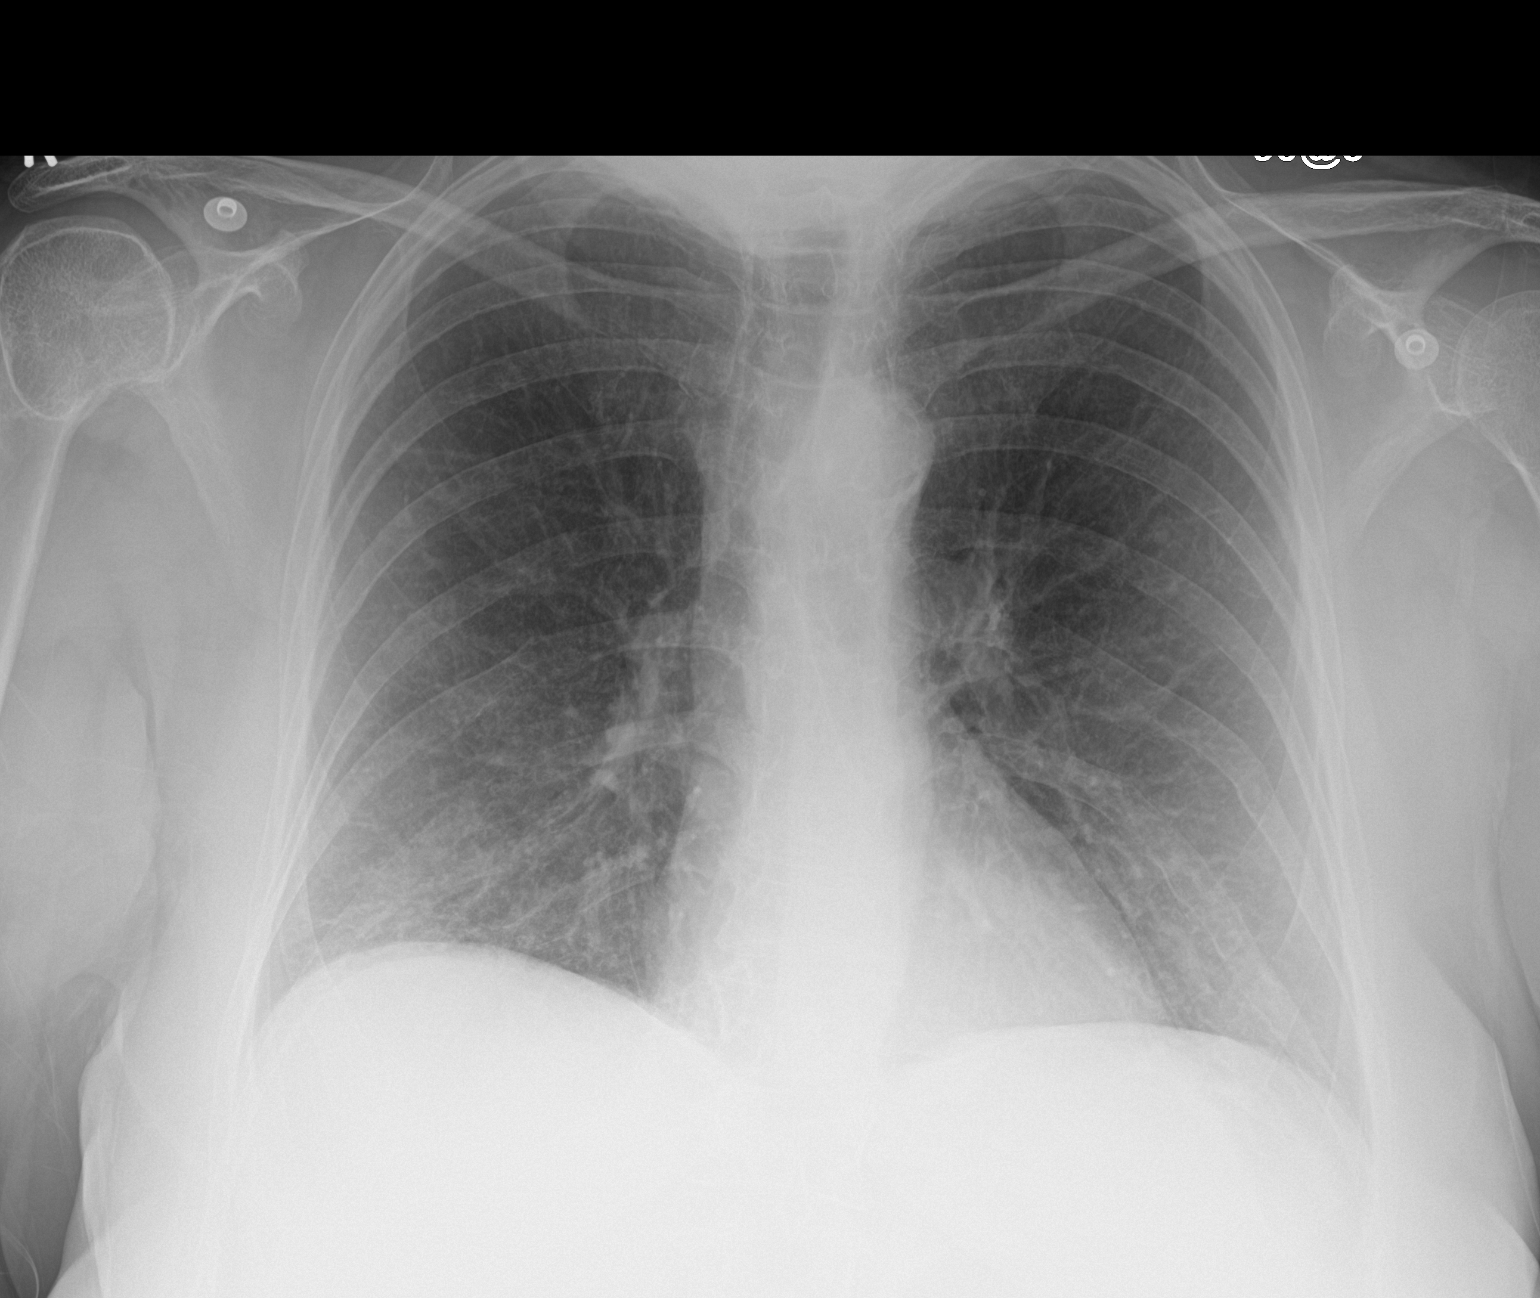

[1 of 1 positions shown; findings below may reference images not displayed]

FINDINGS: The cardiomediastinal contours are normal. Emphysematous change
better appreciated on prior CT. Subsegmental atelectasis at the
right lung base. Pulmonary vasculature is normal. No consolidation,
pleural effusion, or pneumothorax. No acute osseous abnormalities
are seen. Previous rib fractures not as well visualized on portable
exam. Chronic deformity of the right proximal humerus.
IMPRESSION: Subsegmental right basilar atelectasis.  Lungs otherwise clear.

## 2018-01-13 ENCOUNTER — Emergency Department
Admission: EM | Admit: 2018-01-13 | Discharge: 2018-01-13 | Disposition: A | Discharge status: Home / Self Care | Payer: Medicare Other | Attending: Emergency Medicine | Admitting: Emergency Medicine

## 2018-01-13 ENCOUNTER — Other Ambulatory Visit: Payer: Self-pay

## 2018-01-13 ENCOUNTER — Encounter: Payer: Self-pay | Admitting: Emergency Medicine

## 2018-01-13 ENCOUNTER — Emergency Department: Payer: Medicare Other

## 2018-01-13 DIAGNOSIS — Z79899 Other long term (current) drug therapy: Secondary | ICD-10-CM | POA: Insufficient documentation

## 2018-01-13 DIAGNOSIS — Y9389 Activity, other specified: Secondary | ICD-10-CM | POA: Diagnosis not present

## 2018-01-13 DIAGNOSIS — W01198A Fall on same level from slipping, tripping and stumbling with subsequent striking against other object, initial encounter: Secondary | ICD-10-CM | POA: Diagnosis not present

## 2018-01-13 DIAGNOSIS — F17210 Nicotine dependence, cigarettes, uncomplicated: Secondary | ICD-10-CM | POA: Diagnosis not present

## 2018-01-13 DIAGNOSIS — I509 Heart failure, unspecified: Secondary | ICD-10-CM | POA: Insufficient documentation

## 2018-01-13 DIAGNOSIS — R0789 Other chest pain: Secondary | ICD-10-CM

## 2018-01-13 DIAGNOSIS — S2242XA Multiple fractures of ribs, left side, initial encounter for closed fracture: Secondary | ICD-10-CM | POA: Insufficient documentation

## 2018-01-13 DIAGNOSIS — J449 Chronic obstructive pulmonary disease, unspecified: Secondary | ICD-10-CM | POA: Insufficient documentation

## 2018-01-13 DIAGNOSIS — Y92000 Kitchen of unspecified non-institutional (private) residence as  the place of occurrence of the external cause: Secondary | ICD-10-CM | POA: Insufficient documentation

## 2018-01-13 DIAGNOSIS — Y999 Unspecified external cause status: Secondary | ICD-10-CM | POA: Diagnosis not present

## 2018-01-13 DIAGNOSIS — S299XXA Unspecified injury of thorax, initial encounter: Secondary | ICD-10-CM | POA: Diagnosis present

## 2018-01-13 DIAGNOSIS — S2242XD Multiple fractures of ribs, left side, subsequent encounter for fracture with routine healing: Secondary | ICD-10-CM

## 2018-01-13 DIAGNOSIS — I110 Hypertensive heart disease with heart failure: Secondary | ICD-10-CM | POA: Insufficient documentation

## 2018-01-13 LAB — CBC WITH DIFFERENTIAL/PLATELET
BASOS ABS: 0 10*3/uL (ref 0–0.1)
Basophils Relative: 0 %
Eosinophils Absolute: 0 10*3/uL (ref 0–0.7)
Eosinophils Relative: 0 %
HEMATOCRIT: 45.5 % (ref 35.0–47.0)
Hemoglobin: 14.9 g/dL (ref 12.0–16.0)
LYMPHS ABS: 1.2 10*3/uL (ref 1.0–3.6)
LYMPHS PCT: 8 %
MCH: 30.7 pg (ref 26.0–34.0)
MCHC: 32.8 g/dL (ref 32.0–36.0)
MCV: 93.5 fL (ref 80.0–100.0)
Monocytes Absolute: 0.4 10*3/uL (ref 0.2–0.9)
Monocytes Relative: 3 %
NEUTROS ABS: 13 10*3/uL — AB (ref 1.4–6.5)
Neutrophils Relative %: 89 %
Platelets: 214 10*3/uL (ref 150–440)
RBC: 4.86 MIL/uL (ref 3.80–5.20)
RDW: 15.3 % — AB (ref 11.5–14.5)
WBC: 14.6 10*3/uL — ABNORMAL HIGH (ref 3.6–11.0)

## 2018-01-13 MED ORDER — TRAMADOL HCL 50 MG PO TABS: 50.0000 mg | ORAL_TABLET | Freq: Four times a day (QID) | ORAL | 0 refills | Status: DC | PRN

## 2018-01-13 NOTE — ED Provider Notes (Signed)
[facility] Emergency Department Provider Note  Time seen: 6:05 PM  I have reviewed the triage vital signs and the nursing notes.   HISTORY  Chief Complaint Shortness of Breath    HPI Krystal Lee is a 62 y.o. female with a past medical history of COPD, anxiety, gastric reflux, hypertension, presents to the emergency department for left sided chest/rib pain.  According to the patient approximately 6 weeks ago she fell while attempting to move her stove, landing her left chest onto the stove top.  Patient sought medical care the following day was found to have broken 3 ribs.  Patient states approxi-1 week ago she developed a cough with some wheeze she went to her primary care doctor who prescribed her Levaquin and prednisone but did not perform an x-ray.  Patient states myself that she feels better as far as the difficulty breathing and wheeze.  No longer has wheeze.  Finished her prednisone today and has 2 more days of Levaquin.  Patient continues to have left-sided rib pain especially with movement or deep inspiration, she was concerned so she came to the emergency department for evaluation.  Here the patient appears well, no distress, sitting comfortably in a chair speaking in full sentences.   Past Medical History:  Diagnosis Date  . Anxiety   . COPD (chronic obstructive pulmonary disease) (HCC)   . Depression   . GERD (gastroesophageal reflux disease)   . Hepatitis C virus   . Hypertension   . Post-menopausal     Patient Active Problem List   Diagnosis Date Noted  . Acute on chronic respiratory failure with hypoxia and hypercapnia (HCC) 09/16/2017  . COPD with acute exacerbation (HCC) 09/16/2017    Past Surgical History:  Procedure Laterality Date  . CATARACT EXTRACTION  2010   right     Prior to Admission medications   Medication Sig Start Date End Date Taking? Authorizing Provider  alendronate (FOSAMAX) 70 MG tablet Take 70 mg by mouth once  a week.  08/17/17   [provider]  ALPRAZolam Prudy Feeler) 0.5 MG tablet Take 0.5 mg by mouth 4 (four) times daily as needed.  08/08/17   [provider]  budesonide (PULMICORT) 0.25 MG/2ML nebulizer solution Take 2 mLs (0.25 mg total) by nebulization 2 (two) times daily. 09/18/17   Altamese Dilling, MD  CRESTOR 40 MG tablet Take 40 mg by mouth daily.  08/17/17   [provider]  cyclobenzaprine (FLEXERIL) 10 MG tablet Take 10 mg by mouth at bedtime.  08/22/17   [provider]  guaiFENesin-dextromethorphan (ROBITUSSIN DM) 100-10 MG/5ML syrup Take 5 mLs by mouth every 4 (four) hours as needed for cough. 09/18/17   Altamese Dilling, MD  ibuprofen (ADVIL,MOTRIN) 800 MG tablet Take 800 mg by mouth every 8 (eight) hours as needed.  08/17/17   [provider]  ipratropium-albuterol (DUONEB) 0.5-2.5 (3) MG/3ML SOLN Take 3 mLs by nebulization every 6 (six) hours as needed. 09/18/17   Altamese Dilling, MD  medroxyPROGESTERone (PROVERA) 2.5 MG tablet Only take 5 days at the end of the month. 08/17/17   [provider]  metoprolol succinate (TOPROL-XL) 50 MG 24 hr tablet Take 50 mg by mouth daily.  08/22/17   [provider]  pantoprazole (PROTONIX) 40 MG tablet Take 40 mg by mouth daily. 08/17/17   [provider]  predniSONE (STERAPRED UNI-PAK 21 TAB) 10 MG (21) TBPK tablet Take 6 tabs first day, 5 tab on day 2, then 4  on day 3rd, 3 tabs on day 4th , 2 tab on day 5th, and 1 tab on 6th day. 09/18/17   Altamese Dilling, MD  PREMARIN 0.3 MG tablet Take 0.3 mg by mouth daily.  08/17/17   [provider]  PROAIR HFA 108 (90 Base) MCG/ACT inhaler Inhale 1-2 puffs into the lungs every 6 (six) hours as needed.  08/17/17   [provider]  QUEtiapine (SEROQUEL) 50 MG tablet Take 50 mg by mouth daily.  08/17/17   [provider]  SYMBICORT 160-4.5 MCG/ACT inhaler Inhale 2 puffs into the lungs 2 (two) times  daily.  08/17/17   [provider]  tiotropium (SPIRIVA) 18 MCG inhalation capsule Place 1 capsule (18 mcg total) into inhaler and inhale daily. 09/19/17   Altamese Dilling, MD    No Known Allergies  Family History  Problem Relation Age of Onset  . Breast cancer Paternal Grandmother   . Bone cancer Paternal Grandmother   . Pancreatic cancer Mother   . Heart disease Father   . Heart disease Paternal Uncle   . Stroke Paternal Grandfather     Social History Social History   Tobacco Use  . Smoking status: Current Every Day Smoker    Packs/day: 1.00    Years: 43.00    Pack years: 43.00    Types: Cigarettes  . Smokeless tobacco: Never Used  Substance Use Topics  . Alcohol use: Yes    Alcohol/week: 0.6 oz    Types: 1 Cans of beer per week  . Drug use: No    Review of Systems Constitutional: Negative for fever. Eyes: Negative for visual complaints ENT: States mild congestion a week ago Cardiovascular: Left-sided chest pain, constant times 6 weeks. Respiratory: States she was feeling short of breath a week ago denies any shortness of breath currently.  Patient states she had wheeze 1 week ago denies any wheeze currently. Gastrointestinal: Negative for abdominal pain, vomiting Genitourinary: Negative for urinary compaints Musculoskeletal: Left chest wall pain Skin: Negative for skin complaints  Neurological: Negative for headache All other ROS negative  ____________________________________________   PHYSICAL EXAM:  VITAL SIGNS: ED Triage Vitals  Enc Vitals Group     BP 01/13/18 1543 (!) 155/85     Pulse Rate 01/13/18 1543 86     Resp 01/13/18 1543 20     Temp 01/13/18 1543 97.8 F (36.6 C)     Temp Source 01/13/18 1543 Oral     SpO2 01/13/18 1543 99 %     Weight 01/13/18 1517 130 lb (59 kg)     Height 01/13/18 1517 5\' 3"  (1.6 m)     Head Circumference --      Peak Flow --      Pain Score 01/13/18 1517 7     Pain Loc --      Pain Edu? --       Excl. in GC? --    Constitutional: Alert and oriented. Well appearing and in no distress. Eyes: Normal exam ENT   Head: Normocephalic and atraumatic.   Mouth/Throat: Mucous membranes are moist. Cardiovascular: Normal rate, regular rhythm. No murmur Respiratory: Normal respiratory effort without tachypnea nor retractions. Breath sounds are clear.  Patient has mild to moderate left chest wall tenderness palpation Gastrointestinal: Soft and nontender. No distention.   Musculoskeletal: Nontender with normal range of motion in all extremities. No lower extremity tenderness or edema. Neurologic:  Normal speech and language. No gross focal neurologic deficits Skin:  Skin is warm,  dry and intact.  Psychiatric: Mood and affect are normal.  ____________________________________________   RADIOLOGY  X-ray shows left-sided rib fractures.  ____________________________________________   INITIAL IMPRESSION / ASSESSMENT AND PLAN / ED COURSE  Pertinent labs & imaging results that were available during my care of the patient were reviewed by me and considered in my medical decision making (see chart for details).  Patient presents to the emergency department with known left rib fractures which occurred approximately 6 weeks ago with continued pain.  Differential would include continued rib fracture pain, pneumothorax, pneumonia, COPD exacerbation.  Overall the patient appears extremely well, no distress.  X-ray consistent with known rib fractures.  Patient states the only reason she came in is because she thought it would be healed by now and the fact that she is still having pain concerned her.  Patient is very reassured by the x-ray showing no pneumonia or pneumothorax.  Patient already taking antibiotics and just finished a course of prednisone today which would explain her moderate leukocytosis.  They were unable to get lab work on multiple attempts.  I do not believe lab work would be of much  utility at this point, given a very clear cause of the patient's discomfort reproducible on examination.  We will discharge the patient had a short course of tramadol and have the patient follow-up with her primary care doctor.  Patient agreeable to this plan of care.  ____________________________________________   FINAL CLINICAL IMPRESSION(S) / ED DIAGNOSES  Chest wall pain Rib fractures    Minna AntisPaduchowski, Kynlie Jane, MD 01/13/18 [id number]

## 2018-01-13 NOTE — ED Triage Notes (Signed)
Injured left ribs about 6 weeks ago while moving a stove.  To ED today for continued pain with breathing and movement.  No SOB/ DOE.  NAD

## 2018-01-13 NOTE — ED Triage Notes (Signed)
States 6 weeks ago cracked rib L chest trying move fridge. States has been painful. States has had increased wheeze for past week. Saw MD and started on antibiotic and prednisone with no improvement. Did not have xray at that time.

## 2018-01-13 NOTE — ED Notes (Signed)
Patient is a difficult stick.  Will wait to redraw CMP after physician evaluation.

## 2018-02-25 ENCOUNTER — Other Ambulatory Visit: Payer: Self-pay | Admitting: Family Medicine

## 2018-02-25 DIAGNOSIS — R05 Cough: Secondary | ICD-10-CM

## 2018-02-25 DIAGNOSIS — R059 Cough, unspecified: Secondary | ICD-10-CM

## 2018-02-26 ENCOUNTER — Ambulatory Visit
Admission: RE | Admit: 2018-02-26 | Discharge: 2018-02-26 | Disposition: A | Discharge status: Home / Self Care | Payer: Medicare Other | Source: Ambulatory Visit | Attending: Family Medicine | Admitting: Family Medicine

## 2018-02-26 DIAGNOSIS — R05 Cough: Secondary | ICD-10-CM | POA: Diagnosis present

## 2018-02-26 DIAGNOSIS — R059 Cough, unspecified: Secondary | ICD-10-CM

## 2018-02-27 ENCOUNTER — Other Ambulatory Visit: Payer: Self-pay

## 2018-02-27 ENCOUNTER — Emergency Department
Admission: EM | Admit: 2018-02-27 | Discharge: 2018-02-27 | Disposition: A | Discharge status: Home / Self Care | Payer: Medicare Other | Attending: Emergency Medicine | Admitting: Emergency Medicine

## 2018-02-27 ENCOUNTER — Emergency Department: Payer: Medicare Other

## 2018-02-27 ENCOUNTER — Encounter: Payer: Self-pay | Admitting: Emergency Medicine

## 2018-02-27 DIAGNOSIS — F17210 Nicotine dependence, cigarettes, uncomplicated: Secondary | ICD-10-CM | POA: Diagnosis not present

## 2018-02-27 DIAGNOSIS — J40 Bronchitis, not specified as acute or chronic: Secondary | ICD-10-CM | POA: Insufficient documentation

## 2018-02-27 DIAGNOSIS — Z79899 Other long term (current) drug therapy: Secondary | ICD-10-CM | POA: Insufficient documentation

## 2018-02-27 DIAGNOSIS — J449 Chronic obstructive pulmonary disease, unspecified: Secondary | ICD-10-CM | POA: Insufficient documentation

## 2018-02-27 DIAGNOSIS — I10 Essential (primary) hypertension: Secondary | ICD-10-CM | POA: Diagnosis not present

## 2018-02-27 DIAGNOSIS — R05 Cough: Secondary | ICD-10-CM | POA: Diagnosis present

## 2018-02-27 DIAGNOSIS — R059 Cough, unspecified: Secondary | ICD-10-CM

## 2018-02-27 MED ORDER — ALBUTEROL SULFATE (2.5 MG/3ML) 0.083% IN NEBU
2.5000 mg | INHALATION_SOLUTION | Freq: Once | RESPIRATORY_TRACT | Status: AC
  Administered 2018-02-27: 2.5 mg via RESPIRATORY_TRACT

## 2018-02-27 MED ORDER — OXYCODONE-ACETAMINOPHEN 5-325 MG PO TABS
ORAL_TABLET | ORAL | Status: AC
  Administered 2018-02-27: 1 via ORAL
  Filled 2018-02-27: qty 1

## 2018-02-27 MED ORDER — OXYCODONE-ACETAMINOPHEN 5-325 MG PO TABS
1.0000 | ORAL_TABLET | Freq: Once | ORAL | Status: AC
  Administered 2018-02-27: 1 via ORAL

## 2018-02-27 MED ORDER — DEXAMETHASONE SODIUM PHOSPHATE 10 MG/ML IJ SOLN
10.0000 mg | Freq: Once | INTRAMUSCULAR | Status: AC
  Administered 2018-02-27: 10 mg via INTRAMUSCULAR

## 2018-02-27 MED ORDER — AZITHROMYCIN 250 MG PO TABS: ORAL_TABLET | ORAL | 0 refills | Status: DC

## 2018-02-27 MED ORDER — PREDNISONE 20 MG PO TABS
60.0000 mg | ORAL_TABLET | Freq: Once | ORAL | Status: DC
  Filled 2018-02-27: qty 3

## 2018-02-27 MED ORDER — DEXAMETHASONE SODIUM PHOSPHATE 10 MG/ML IJ SOLN
INTRAMUSCULAR | Status: AC
  Administered 2018-02-27: 10 mg via INTRAMUSCULAR
  Filled 2018-02-27: qty 1

## 2018-02-27 MED ORDER — ALBUTEROL SULFATE (2.5 MG/3ML) 0.083% IN NEBU
INHALATION_SOLUTION | RESPIRATORY_TRACT | Status: AC
  Administered 2018-02-27: 2.5 mg via RESPIRATORY_TRACT
  Filled 2018-02-27: qty 3

## 2018-02-27 NOTE — ED Notes (Signed)
MD at bedside. 

## 2018-02-27 NOTE — ED Provider Notes (Addendum)
[facility] Emergency Department Provider Note  ____________________________________________   I have reviewed the triage vital signs and the nursing notes. Where available I have reviewed prior notes and, if possible and indicated, outside hospital notes.    HISTORY  Chief Complaint Cough and Shortness of Breath    HPI Krystal Lee is a 62 y.o. female resents today with a cough.  Patient has history of COPD cough and she has had rib fractures in the past.  She states that she broke her ribs in October or thereabouts, she has had pain every time she is called.  This pain is continued.  She states she has runny nose, cough, productive cough, and wheeze.  She states she wants IV antibiotics.  She does not have a fever.  She denies any leg swelling, she did not have any exertional symptoms, she states it only hurts in the left side exactly under her left breast where her rib fractures were and only when she coughs.  She has no headache, she denies exertional symptoms, she refuses blood work and initially refused EKG.  Patient is using her home inhalers with some success she states.    Past Medical History:  Diagnosis Date  . Anxiety   . COPD (chronic obstructive pulmonary disease) (HCC)   . Depression   . GERD (gastroesophageal reflux disease)   . Hepatitis C virus   . Hypertension   . Post-menopausal     Patient Active Problem List   Diagnosis Date Noted  . Acute on chronic respiratory failure with hypoxia and hypercapnia (HCC) 09/16/2017  . COPD with acute exacerbation (HCC) 09/16/2017    Past Surgical History:  Procedure Laterality Date  . CATARACT EXTRACTION  2010   right     Prior to Admission medications   Medication Sig Start Date End Date Taking? Authorizing Provider  alendronate (FOSAMAX) 70 MG tablet Take 70 mg by mouth once a week.  08/17/17   [provider]  ALPRAZolam Prudy Feeler) 0.5 MG tablet Take 0.5 mg by mouth 4 (four) times  daily as needed.  08/08/17   [provider]  budesonide (PULMICORT) 0.25 MG/2ML nebulizer solution Take 2 mLs (0.25 mg total) by nebulization 2 (two) times daily. 09/18/17   Altamese Dilling, MD  CRESTOR 40 MG tablet Take 40 mg by mouth daily.  08/17/17   [provider]  cyclobenzaprine (FLEXERIL) 10 MG tablet Take 10 mg by mouth at bedtime.  08/22/17   [provider]  guaiFENesin-dextromethorphan (ROBITUSSIN DM) 100-10 MG/5ML syrup Take 5 mLs by mouth every 4 (four) hours as needed for cough. 09/18/17   Altamese Dilling, MD  ibuprofen (ADVIL,MOTRIN) 800 MG tablet Take 800 mg by mouth every 8 (eight) hours as needed.  08/17/17   [provider]  ipratropium-albuterol (DUONEB) 0.5-2.5 (3) MG/3ML SOLN Take 3 mLs by nebulization every 6 (six) hours as needed. 09/18/17   Altamese Dilling, MD  medroxyPROGESTERone (PROVERA) 2.5 MG tablet Only take 5 days at the end of the month. 08/17/17   [provider]  metoprolol succinate (TOPROL-XL) 50 MG 24 hr tablet Take 50 mg by mouth daily.  08/22/17   [provider]  pantoprazole (PROTONIX) 40 MG tablet Take 40 mg by mouth daily. 08/17/17   [provider]  predniSONE (STERAPRED UNI-PAK 21 TAB) 10 MG (21) TBPK tablet Take 6 tabs first day, 5 tab on day 2, then 4 on day 3rd, 3 tabs on day 4th , 2 tab on day  5th, and 1 tab on 6th day. 09/18/17   Altamese DillingVachhani, Vaibhavkumar, MD  PREMARIN 0.3 MG tablet Take 0.3 mg by mouth daily.  08/17/17   [provider]  PROAIR HFA 108 (90 Base) MCG/ACT inhaler Inhale 1-2 puffs into the lungs every 6 (six) hours as needed.  08/17/17   [provider]  QUEtiapine (SEROQUEL) 50 MG tablet Take 50 mg by mouth daily.  08/17/17   [provider]  SYMBICORT 160-4.5 MCG/ACT inhaler Inhale 2 puffs into the lungs 2 (two) times daily.  08/17/17   [provider]  tiotropium (SPIRIVA) 18 MCG inhalation capsule Place 1 capsule (18 mcg  total) into inhaler and inhale daily. 09/19/17   Altamese DillingVachhani, Vaibhavkumar, MD  traMADol (ULTRAM) 50 MG tablet Take 1 tablet (50 mg total) by mouth every 6 (six) hours as needed. 01/13/18   Minna AntisPaduchowski, Kevin, MD    Allergies Patient has no known allergies.  Family History  Problem Relation Age of Onset  . Breast cancer Paternal Grandmother   . Bone cancer Paternal Grandmother   . Pancreatic cancer Mother   . Heart disease Father   . Heart disease Paternal Uncle   . Stroke Paternal Grandfather     Social History Social History   Tobacco Use  . Smoking status: Current Every Day Smoker    Packs/day: 1.00    Years: 43.00    Pack years: 43.00    Types: Cigarettes  . Smokeless tobacco: Never Used  Substance Use Topics  . Alcohol use: Yes    Alcohol/week: 0.6 oz    Types: 1 Cans of beer per week  . Drug use: No    Review of Systems Constitutional: No fever/chills Eyes: No visual changes. ENT: No sore throat. No stiff neck no neck pain Cardiovascular: Positive chest wall pain Respiratory: Denies shortness of breath. Gastrointestinal:   no vomiting.  No diarrhea.  No constipation. Genitourinary: Negative for dysuria. Musculoskeletal: Negative lower extremity swelling Skin: Negative for rash. Neurological: Negative for severe headaches, focal weakness or numbness.   ____________________________________________   PHYSICAL EXAM:  VITAL SIGNS: ED Triage Vitals  Enc Vitals Group     BP 02/27/18 1535 138/90     Pulse Rate 02/27/18 1535 85     Resp 02/27/18 1535 18     Temp 02/27/18 1535 97.8 F (36.6 C)     Temp Source 02/27/18 1535 Oral     SpO2 02/27/18 1535 97 %     Weight 02/27/18 1536 143 lb (64.9 kg)     Height 02/27/18 1536 4\' 11"  (1.499 m)     Head Circumference --      Peak Flow --      Pain Score 02/27/18 1536 10     Pain Loc --      Pain Edu? --      Excl. in GC? --     Constitutional: Alert and oriented. Well appearing and in no acute distress.   Sitting with her legs crossed watching TV in absolutely no apparent distress Eyes: Conjunctivae are normal Head: Atraumatic HEENT: No congestion/rhinnorhea. Mucous membranes are moist.  Oropharynx non-erythematous Neck:   Nontender with no meningismus, no masses, no stridor Cardiovascular: Normal rate, regular rhythm. Grossly normal heart sounds.  Good peripheral circulation. Respiratory: Normal respiratory effort.  No retractions. Lungs very slight occasional wheeze no increased work of breathing very reassuring exam : Female chaperone present, female PA student Haley, no chest wall tenderness is noted no flail chest no crepitus, and  I touch this area in the left chest wall, patient states "ouch that the pain right there" and pulls back.  Shingles or abscess noted Abdominal: Soft and nontender. No distention. No guarding no rebound Back:  There is no focal tenderness or step off.  there is no midline tenderness there are no lesions noted. there is no CVA tenderness  Musculoskeletal: No lower extremity tenderness, no upper extremity tenderness. No joint effusions, no DVT signs strong distal pulses no edema Neurologic:  Normal speech and language. No gross focal neurologic deficits are appreciated.  Skin:  Skin is warm, dry and intact. No rash noted. Psychiatric: Mood and affect are normal. Speech and behavior are normal.  ____________________________________________   LABS (all labs ordered are listed, but only abnormal results are displayed)  Labs Reviewed - No data to display  Pertinent labs  results that were available during my care of the patient were reviewed by me and considered in my medical decision making (see chart for details). ____________________________________________  EKG  I personally interpreted any EKGs ordered by me or triage Normal sinus rhythm rate 73 beats minute no acute ST elevation or depression normal axis unremarkable  EKG ____________________________________________  RADIOLOGY  Pertinent labs & imaging results that were available during my care of the patient were reviewed by me and considered in my medical decision making (see chart for details). If possible, patient and/or family made aware of any abnormal findings.  Dg Chest 2 View  Result Date: 02/27/2018 CLINICAL DATA:  Cough and short of breath EXAM: CHEST - 2 VIEW COMPARISON:  02/26/2018, 01/13/2017, 09/16/2017 FINDINGS: Chronic bronchitic changes. No acute consolidation or pleural effusion. Stable cardiomediastinal silhouette with aortic atherosclerosis. Mild apical pleural thickening. No pneumothorax. IMPRESSION: No active cardiopulmonary disease.  Chronic bronchitic changes. Electronically Signed   By: Jasmine Pang M.D.   On: 02/27/2018 21:33   ____________________________________________    PROCEDURES  Procedure(s) performed: None  Procedures  Critical Care performed: None  ____________________________________________   INITIAL IMPRESSION / ASSESSMENT AND PLAN / ED COURSE  Pertinent labs & imaging results that were available during my care of the patient were reviewed by me and considered in my medical decision making (see chart for details).  I personally reviewed x-ray  Patient is here with a slight cough, which is apparently productive of mucus, she does have COPD's her vital signs are normal her exam is very reassuring her lungs are clear at this time, we will give her steroids.  I try to give her oral steroids but she insisted on IM steroids.  We tried to do blood work which she refused, they did an EKG but only after I insisted upon it that she initially refused that.  She wants IV antibiotics and admission.  None of this is indicated.  Certainly nothing to suggest ACS PE or dissection for this very reproducible chest wall pain for 4 months worse with cough.  Very limited ability to work this up any further however given her  refuses.  Even that she does have a productive cough and history of COPD, we will give her a short course of antibiotics and that hopefully will help her recover.  She does not meet requirements for admission, return precautions follow-up given and understood.  At this time, there does not appear to be clinical evidence to support the diagnosis of pulmonary embolus, dissection, myocarditis, endocarditis, pericarditis, pericardial tamponade, acute coronary syndrome, pneumothorax, pneumonia, or any other acute intrathoracic pathology that will require admission or acute  intervention. Nor is there evidence of any significant intra-abdominal pathology causing this discomfort.  ----------------------------------------- 10:50 PM on 02/27/2018 -----------------------------------------  Feels much better eager to go home at this time, does want Percocet per her request because of her chronic chest wall pain still refuses blood work.  After albuterol she had a very brief episode of narrow complex tachycardia which lasted for perhaps 8 seconds.  Is hard to tell if it was artifact or not.  In any event, I did offer her blood work for this and she refuses.  Again very low suspicion for ACS PE or dissection.  She understands limitations that her declining further workup places upon [place].  We will send her home with a Z-Pak as he does have a productive cough and we will have her follow closely with her cardiologist.    ____________________________________________   FINAL CLINICAL IMPRESSION(S) / ED DIAGNOSES  Final diagnoses:  None      This chart was dictated using voice recognition software.  Despite best efforts to proofread,  errors can occur which can change meaning.      Jeanmarie Plant, MD 02/27/18 2212    Jeanmarie Plant, MD 02/27/18 2251

## 2018-02-27 NOTE — Discharge Instructions (Signed)
He did have a few beats of rapid heart rate after your albuterol please follow-up with Dr. Lennette BihariKohn for that.  Return to the emergency room for any new or worrisome symptoms.  You do not want blood work done at this time which is certainly your choice but limits her ability to further evaluate you.  If you feel worse in any way please return.

## 2018-02-27 NOTE — ED Triage Notes (Signed)
Pt presnets to ED via POV, states was seen by her PCP and had chest x-ray done yesterday for cough, and SHOB. Pt states broke 3 ribs a few months ago and since then has had SHOB. Pt states hx of COPD, per chest x-ray pt has chronic bronchitic changes. Pt states she told her PCP she "didn't want to waste her time" and opted to come to ED for IV antibiotics, during triage pt stated that she wanted to be admitted. Pt refused EKG due to just having had echo on Monday and her "heart was fine", pt also refusing bloodwork in triage due to being "a very hard stick". Pt requesting to be evaluated by an MD prior to being stuck for blood.

## 2018-03-16 ENCOUNTER — Emergency Department: Payer: Medicare Other

## 2018-03-16 ENCOUNTER — Other Ambulatory Visit: Payer: Self-pay

## 2018-03-16 ENCOUNTER — Encounter: Payer: Self-pay | Admitting: Emergency Medicine

## 2018-03-16 ENCOUNTER — Emergency Department
Admission: EM | Admit: 2018-03-16 | Discharge: 2018-03-17 | Disposition: A | Discharge status: Home / Self Care | Payer: Medicare Other | Attending: Emergency Medicine | Admitting: Emergency Medicine

## 2018-03-16 DIAGNOSIS — I959 Hypotension, unspecified: Secondary | ICD-10-CM | POA: Insufficient documentation

## 2018-03-16 DIAGNOSIS — F17210 Nicotine dependence, cigarettes, uncomplicated: Secondary | ICD-10-CM | POA: Diagnosis not present

## 2018-03-16 DIAGNOSIS — R0600 Dyspnea, unspecified: Secondary | ICD-10-CM | POA: Diagnosis not present

## 2018-03-16 DIAGNOSIS — R0602 Shortness of breath: Secondary | ICD-10-CM | POA: Diagnosis present

## 2018-03-16 DIAGNOSIS — J449 Chronic obstructive pulmonary disease, unspecified: Secondary | ICD-10-CM | POA: Diagnosis not present

## 2018-03-16 DIAGNOSIS — J441 Chronic obstructive pulmonary disease with (acute) exacerbation: Secondary | ICD-10-CM

## 2018-03-16 DIAGNOSIS — R531 Weakness: Secondary | ICD-10-CM | POA: Diagnosis not present

## 2018-03-16 DIAGNOSIS — I10 Essential (primary) hypertension: Secondary | ICD-10-CM | POA: Insufficient documentation

## 2018-03-16 LAB — COMPREHENSIVE METABOLIC PANEL
ALBUMIN: 3.7 g/dL (ref 3.5–5.0)
ALK PHOS: 44 U/L (ref 38–126)
ALT: 10 U/L — AB (ref 14–54)
ANION GAP: 10 (ref 5–15)
AST: 15 U/L (ref 15–41)
BILIRUBIN TOTAL: 0.4 mg/dL (ref 0.3–1.2)
BUN: 12 mg/dL (ref 6–20)
CALCIUM: 9 mg/dL (ref 8.9–10.3)
CO2: 23 mmol/L (ref 22–32)
CREATININE: 0.58 mg/dL (ref 0.44–1.00)
Chloride: 101 mmol/L (ref 101–111)
GFR calc Af Amer: >60 mL/min (ref >60)
GFR calc non Af Amer: >60 mL/min (ref >60)
GLUCOSE: 93 mg/dL (ref 65–99)
Potassium: 4.1 mmol/L (ref 3.5–5.1)
SODIUM: 134 mmol/L — AB (ref 135–145)
TOTAL PROTEIN: 6 g/dL — AB (ref 6.5–8.1)

## 2018-03-16 LAB — TROPONIN I: Troponin I: <0.03 ng/mL (ref <0.03)

## 2018-03-16 LAB — LACTIC ACID, PLASMA: Lactic Acid, Venous: 1.2 mmol/L (ref 0.5–1.9)

## 2018-03-16 MED ORDER — SODIUM CHLORIDE 0.9 % IV SOLN
Freq: Once | INTRAVENOUS | Status: AC
  Administered 2018-03-16: 23:00:00 via INTRAVENOUS

## 2018-03-16 NOTE — ED Notes (Signed)
Patient ambulatory to stat desk without difficulty.  Reports being short of breath, seen in ED recently for same, not feeling any better.  Patient is able to speak in complete sentences at this time.

## 2018-03-16 NOTE — ED Triage Notes (Signed)
Pt arrives POV to triage with c/o SOB. Pt reports dizziness and is pale and hypotensive at this time in triage.

## 2018-03-16 NOTE — ED Notes (Signed)
One set of blood cultures obtained at this time °

## 2018-03-16 NOTE — ED Provider Notes (Signed)
[facility] Emergency Department Provider Note       Time seen: ----------------------------------------- 10:30 PM on 03/16/2018 -----------------------------------------   I have reviewed the triage vital signs and the nursing notes.  HISTORY   Chief Complaint Shortness of Breath   HPI Krystal Lee is a 62 y.o. female with a history of anxiety, COPD, depression, GERD, hepatitis C and hypertension who presents to the ED for shortness of breath.  Patient reports being on 3 rounds of antibiotics for bronchitis and COPD without any improvement.  Patient is still feeling weak, she denies fevers or chills.  Blood pressure was found to be low on arrival to the ER, she states it is actually been running high recently.    Past Medical History:  Diagnosis Date  . Anxiety   . COPD (chronic obstructive pulmonary disease) (HCC)   . Depression   . GERD (gastroesophageal reflux disease)   . Hepatitis C virus   . Hypertension   . Post-menopausal     Patient Active Problem List   Diagnosis Date Noted  . Acute on chronic respiratory failure with hypoxia and hypercapnia (HCC) 09/16/2017  . COPD with acute exacerbation (HCC) 09/16/2017    Past Surgical History:  Procedure Laterality Date  . CATARACT EXTRACTION  2010   right     Allergies Patient has no known allergies.  Social History Social History   Tobacco Use  . Smoking status: Current Every Day Smoker    Packs/day: 1.00    Years: 43.00    Pack years: 43.00    Types: Cigarettes  . Smokeless tobacco: Never Used  Substance Use Topics  . Alcohol use: Yes    Alcohol/week: 0.6 oz    Types: 1 Cans of beer per week  . Drug use: No   Review of Systems Constitutional: Negative for fever. Cardiovascular: Negative for chest pain. Respiratory: Positive shortness of breath and cough Gastrointestinal: Negative for abdominal pain, vomiting and diarrhea. Genitourinary: Negative for  dysuria. Musculoskeletal: Negative for back pain. Skin: Negative for rash. Neurological: Positive for weakness and dizziness  All systems negative/normal/unremarkable except as stated in the HPI  ____________________________________________   PHYSICAL EXAM:  VITAL SIGNS: ED Triage Vitals  Enc Vitals Group     BP 03/16/18 2202 (!) 78/45     Pulse Rate 03/16/18 2202 87     Resp --      Temp 03/16/18 2202 97.9 F (36.6 C)     Temp Source 03/16/18 2202 Oral     SpO2 03/16/18 2202 90 %     Weight 03/16/18 2216 143 lb (64.9 kg)     Height 03/16/18 2216  (1.499 m)     Head Circumference --      Peak Flow --      Pain Score 03/16/18 2216 10     Pain Loc --      Pain Edu? --      Excl. in GC? --    Constitutional: Alert and oriented.  Mild distress Eyes: Conjunctivae are normal. Normal extraocular movements. ENT   Head: Normocephalic and atraumatic.   Nose: No congestion/rhinnorhea.   Mouth/Throat: Mucous membranes are moist.   Neck: No stridor. Cardiovascular: Normal rate, regular rhythm. No murmurs, rubs, or gallops. Respiratory: Normal respiratory effort without tachypnea nor retractions. Breath sounds are clear and equal bilaterally. No wheezes/rales/rhonchi. Gastrointestinal: Soft and nontender. Normal bowel sounds Musculoskeletal: Nontender with normal range of motion in extremities. No lower extremity tenderness nor edema. Neurologic:  Normal speech and language. No gross focal neurologic deficits are appreciated.  Skin:  Skin is warm, dry and intact. No rash noted. Psychiatric: Mood and affect are normal. Speech and behavior are normal.  ____________________________________________  EKG: Interpreted by me.  Sinus rhythm rate 89 bpm, low voltage QRS, normal axis, normal QT  ____________________________________________  ED COURSE:  As part of my medical decision making, I reviewed the following data within the electronic MEDICAL RECORD NUMBER History  obtained from family if available, nursing notes, old chart and ekg, as well as notes from prior ED visits. Patient presented for shortness of breath but was found to be hypotensive, we will assess with labs and imaging as indicated at this time.  Patient will receive IV fluids.   Procedures ____________________________________________   LABS (pertinent positives/negatives)  Labs Reviewed  CULTURE, BLOOD (ROUTINE X 2)  CULTURE, BLOOD (ROUTINE X 2)  URINE CULTURE  LACTIC ACID, PLASMA  LACTIC ACID, PLASMA  COMPREHENSIVE METABOLIC PANEL  TROPONIN I  CBC WITH DIFFERENTIAL/PLATELET  BLOOD GAS, VENOUS  URINALYSIS, COMPLETE (UACMP) WITH MICROSCOPIC  CBC WITH DIFFERENTIAL/PLATELET    RADIOLOGY  Chest x-ray is pending at this time  ____________________________________________  DIFFERENTIAL DIAGNOSIS   Dehydration, electrolyte abnormality, sepsis, pneumonia, adrenal insufficiency  FINAL ASSESSMENT AND PLAN  Dyspnea, hypotension   Plan: The patient had presented for persistent dyspnea despite recent treatment for bronchitis or COPD. Patient's labs and imaging are still pending, she did respond to a fluid challenge well.   Ulice Dash, MD   Note: This note was generated in part or whole with voice recognition software. Voice recognition is usually quite accurate but there are transcription errors that can and very often do occur. I apologize for any typographical errors that were not detected and corrected.     Emily Filbert, MD 03/16/18 [phone]

## 2018-03-17 LAB — CBC WITH DIFFERENTIAL/PLATELET
Basophils Absolute: 0.1 10*3/uL (ref 0–0.1)
Basophils Relative: 1 %
EOS ABS: 2.6 10*3/uL — AB (ref 0–0.7)
Eosinophils Relative: 19 %
HCT: 39.5 % (ref 35.0–47.0)
Hemoglobin: 13.5 g/dL (ref 12.0–16.0)
LYMPHS ABS: 2.8 10*3/uL (ref 1.0–3.6)
Lymphocytes Relative: 21 %
MCH: 31.7 pg (ref 26.0–34.0)
MCHC: 34.1 g/dL (ref 32.0–36.0)
MCV: 93.1 fL (ref 80.0–100.0)
MONO ABS: 0.7 10*3/uL (ref 0.2–0.9)
Monocytes Relative: 5 %
NEUTROS PCT: 54 %
Neutro Abs: 7.3 10*3/uL — ABNORMAL HIGH (ref 1.4–6.5)
PLATELETS: 173 10*3/uL (ref 150–440)
RBC: 4.24 MIL/uL (ref 3.80–5.20)
RDW: 15.5 % — AB (ref 11.5–14.5)
WBC: 13.5 10*3/uL — AB (ref 3.6–11.0)

## 2018-03-17 LAB — URINALYSIS, COMPLETE (UACMP) WITH MICROSCOPIC
BILIRUBIN URINE: NEGATIVE
Glucose, UA: NEGATIVE mg/dL
HGB URINE DIPSTICK: NEGATIVE
KETONES UR: NEGATIVE mg/dL
LEUKOCYTES UA: NEGATIVE
NITRITE: NEGATIVE
Protein, ur: NEGATIVE mg/dL
Specific Gravity, Urine: 1.004 — ABNORMAL LOW (ref 1.005–1.030)
pH: 6 (ref 5.0–8.0)

## 2018-03-17 LAB — BLOOD GAS, VENOUS
Acid-base deficit: 3.4 mmol/L — ABNORMAL HIGH (ref 0.0–2.0)
BICARBONATE: 22.7 mmol/L (ref 20.0–28.0)
O2 Saturation: 72.7 %
PH VEN: 7.32 (ref 7.250–7.430)
PO2 VEN: 42 mmHg (ref 32.0–45.0)
Patient temperature: 37
pCO2, Ven: 44 mmHg (ref 44.0–60.0)

## 2018-03-17 MED ORDER — IPRATROPIUM-ALBUTEROL 0.5-2.5 (3) MG/3ML IN SOLN
3.0000 mL | Freq: Once | RESPIRATORY_TRACT | Status: AC
  Administered 2018-03-17: 3 mL via RESPIRATORY_TRACT
  Filled 2018-03-17: qty 6

## 2018-03-17 MED ORDER — LEVOFLOXACIN 500 MG PO TABS: 500.0000 mg | ORAL_TABLET | Freq: Every day | ORAL | 0 refills | Status: AC

## 2018-03-17 MED ORDER — LEVOFLOXACIN 500 MG PO TABS
500.0000 mg | ORAL_TABLET | Freq: Once | ORAL | Status: AC
Start: 2018-03-17 — End: 2018-03-17
  Administered 2018-03-17: 500 mg via ORAL
  Filled 2018-03-17: qty 1

## 2018-03-17 MED ORDER — IPRATROPIUM-ALBUTEROL 0.5-2.5 (3) MG/3ML IN SOLN
3.0000 mL | Freq: Once | RESPIRATORY_TRACT | Status: AC
  Administered 2018-03-17: 3 mL via RESPIRATORY_TRACT

## 2018-03-17 MED ORDER — PREDNISONE 20 MG PO TABS
60.0000 mg | ORAL_TABLET | Freq: Once | ORAL | Status: AC
  Administered 2018-03-17: 60 mg via ORAL
  Filled 2018-03-17: qty 3

## 2018-03-17 MED ORDER — OXYCODONE-ACETAMINOPHEN 5-325 MG PO TABS
2.0000 | ORAL_TABLET | Freq: Once | ORAL | Status: AC
  Administered 2018-03-17: 2 via ORAL
  Filled 2018-03-17: qty 2

## 2018-03-17 MED ORDER — PREDNISONE 20 MG PO TABS: 60.0000 mg | ORAL_TABLET | Freq: Every day | ORAL | 0 refills | Status: DC

## 2018-03-17 MED ORDER — IPRATROPIUM-ALBUTEROL 0.5-2.5 (3) MG/3ML IN SOLN
3.0000 mL | Freq: Once | RESPIRATORY_TRACT | Status: AC
  Administered 2018-03-17: 3 mL via RESPIRATORY_TRACT
  Filled 2018-03-17: qty 3

## 2018-03-17 NOTE — ED Notes (Signed)
Called to room by pt; pt becoming irritated that she's still here; understands MD wants to reassess her before she will let her go; pt still sitting on the end of the bed; sats 87-88% on room air; oxygen back on at 3L via [place]; sats up to 92%;

## 2018-03-17 NOTE — Discharge Instructions (Addendum)
Please follow up with your primary care physician. Please return with any worsened condition or any other concerns °

## 2018-03-17 NOTE — ED Notes (Signed)
Pt unhooked from monitor and blood pressure cuff to use toilet in room; pt becoming restless and would like her results and discharge; will notify MD;

## 2018-03-17 NOTE — ED Notes (Signed)
Dr Zenda Alpers in to follow up

## 2018-03-17 NOTE — ED Notes (Signed)
Breathing treatment complete; pt back on O2 via [place] at 2L; pt says she wears 2-3L 24 hours a day

## 2018-03-17 NOTE — ED Notes (Signed)
Pt up to toilet in room to void and collect urine specimen;  

## 2018-03-17 NOTE — ED Notes (Signed)
Spoke with Karena Addison in lab; phlebotomist to come redraw lavender tube and venous blood gas; second line not needed per MD and multiple attempts to draw have been unsuccessful;

## 2018-03-17 NOTE — ED Provider Notes (Signed)
-----------------------------------------   4:16 AM on 03/17/2018 -----------------------------------------   Blood pressure 108/63, pulse 73, temperature 97.9 F (36.6 C), temperature source Oral, resp. rate 14, height  (1.499 m), weight 64.9 kg (143 lb), SpO2 95 %.  Assuming care from Dr. Mayford Knife.  In short, Krystal Lee is a 62 y.o. female with a chief complaint of Shortness of Breath .  Refer to the original H&P for additional details.  The current plan of care is to follow up the results of the blood work and determine the patient's disposition.     The patient's urinalysis was unremarkable.  The patient CBC showed a white count of 13.5.  The patient's blood gas was also unremarkable.  The patient's chest x-ray showed some hyperinflation with no consolidation.  I did give the patient an initial DuoNeb as she felt wheezy.  I went back and listen to her and she still had some wheezing.  I gave the patient another 2 DuoNeb treatments, prednisone and levofloxacin.  The patient's blood work is unremarkable.  She did have some initial low blood pressures but Dr. Mayford Knife felt that that was due to an inappropriate blood pressure cuff.  The patient is not tachycardic, not febrile, not hypoxic on her normal amount of O2.  After the medications the patient did feel improved.  She will be discharged home to follow-up with her primary care physician.   Rebecka Apley, MD 03/17/18 [phone]

## 2018-03-17 NOTE — ED Notes (Signed)
Lab at bedside performing venipuncture.

## 2018-03-17 NOTE — ED Notes (Addendum)
Pt presented tonight with sudden onset of weakness and shortness of breath; pt says she's been taking antibiotics for a month for pneumonia; says she was here 2 weeks ago and diagnosed with bronchitis; pt is upset a UA was not performed at that time but pt did not report urinary symptoms on that visit; pt says she wears oxygen more often than not but can go short times without it; pt continues to smoke 1ppd x 43 years;

## 2018-03-17 NOTE — ED Notes (Signed)
In to give pt prescribed medications; pt is sitting on the side of the bed, completely dresses and ready to leave as soon as her breathing treatments are complete; she has taken off her oxygen and sats are 91-92%; pt does not wish to put her oxygen back on;

## 2018-03-18 LAB — URINE CULTURE: CULTURE: NO GROWTH

## 2018-03-21 LAB — CULTURE, BLOOD (ROUTINE X 2)
CULTURE: NO GROWTH
SPECIAL REQUESTS: ADEQUATE

## 2018-03-22 LAB — CULTURE, BLOOD (ROUTINE X 2): CULTURE: NO GROWTH

## 2018-07-09 ENCOUNTER — Emergency Department
Admission: EM | Admit: 2018-07-09 | Discharge: 2018-07-09 | Disposition: A | Discharge status: Home / Self Care | Payer: Medicare HMO | Attending: Emergency Medicine | Admitting: Emergency Medicine

## 2018-07-09 ENCOUNTER — Emergency Department: Payer: Medicare HMO

## 2018-07-09 ENCOUNTER — Other Ambulatory Visit: Payer: Self-pay

## 2018-07-09 DIAGNOSIS — F17210 Nicotine dependence, cigarettes, uncomplicated: Secondary | ICD-10-CM | POA: Diagnosis not present

## 2018-07-09 DIAGNOSIS — I10 Essential (primary) hypertension: Secondary | ICD-10-CM | POA: Diagnosis not present

## 2018-07-09 DIAGNOSIS — J441 Chronic obstructive pulmonary disease with (acute) exacerbation: Secondary | ICD-10-CM

## 2018-07-09 DIAGNOSIS — R0602 Shortness of breath: Secondary | ICD-10-CM | POA: Diagnosis present

## 2018-07-09 LAB — CBC WITH DIFFERENTIAL/PLATELET
BASOS ABS: 0.2 10*3/uL — AB (ref 0–0.1)
Basophils Relative: 2 %
EOS ABS: 0.5 10*3/uL (ref 0–0.7)
Eosinophils Relative: 5 %
HCT: 38.8 % (ref 35.0–47.0)
HEMOGLOBIN: 13.6 g/dL (ref 12.0–16.0)
LYMPHS ABS: 2.5 10*3/uL (ref 1.0–3.6)
LYMPHS PCT: 30 %
MCH: 33.4 pg (ref 26.0–34.0)
MCHC: 35 g/dL (ref 32.0–36.0)
MCV: 95.6 fL (ref 80.0–100.0)
Monocytes Absolute: 0.7 10*3/uL (ref 0.2–0.9)
Monocytes Relative: 8 %
NEUTROS PCT: 55 %
Neutro Abs: 4.7 10*3/uL (ref 1.4–6.5)
Platelets: 157 10*3/uL (ref 150–440)
RBC: 4.06 MIL/uL (ref 3.80–5.20)
RDW: 14.9 % — ABNORMAL HIGH (ref 11.5–14.5)
WBC: 8.5 10*3/uL (ref 3.6–11.0)

## 2018-07-09 LAB — BASIC METABOLIC PANEL
Anion gap: 9 (ref 5–15)
BUN: 10 mg/dL (ref 6–20)
CHLORIDE: 109 mmol/L (ref 98–111)
CO2: 22 mmol/L (ref 22–32)
Calcium: 8.8 mg/dL — ABNORMAL LOW (ref 8.9–10.3)
Creatinine, Ser: 0.59 mg/dL (ref 0.44–1.00)
GFR calc Af Amer: >60 mL/min (ref >60)
GFR calc non Af Amer: >60 mL/min (ref >60)
Glucose, Bld: 89 mg/dL (ref 70–99)
POTASSIUM: 3.8 mmol/L (ref 3.5–5.1)
SODIUM: 140 mmol/L (ref 135–145)

## 2018-07-09 LAB — BRAIN NATRIURETIC PEPTIDE: B NATRIURETIC PEPTIDE 5: 18 pg/mL (ref 0.0–100.0)

## 2018-07-09 LAB — TROPONIN I

## 2018-07-09 MED ORDER — IPRATROPIUM-ALBUTEROL 0.5-2.5 (3) MG/3ML IN SOLN
3.0000 mL | Freq: Once | RESPIRATORY_TRACT | Status: AC
  Administered 2018-07-09: 3 mL via RESPIRATORY_TRACT
  Filled 2018-07-09: qty 3

## 2018-07-09 MED ORDER — METHYLPREDNISOLONE SODIUM SUCC 125 MG IJ SOLR
125.0000 mg | Freq: Once | INTRAMUSCULAR | Status: AC
  Administered 2018-07-09: 125 mg via INTRAVENOUS
  Filled 2018-07-09: qty 2

## 2018-07-09 MED ORDER — PREDNISONE 20 MG PO TABS: 60.0000 mg | ORAL_TABLET | Freq: Every day | ORAL | 0 refills | Status: AC

## 2018-07-09 NOTE — ED Provider Notes (Signed)
-----------------------------------------   4:07 PM on 07/09/2018 -----------------------------------------  I took over care on this patient from Dr. Mayford KnifeWilliams.  The patient presented with likely COPD exacerbation and was pending chest x-ray and work-up, as well as reassessment after nebulizer and steroid.  On reassessment, the patient's blood pressure is 105/70, O2 saturation is in the mid 90s on room air, and the patient states that she feels much better.  Lab work-up is unremarkable, and the chest x-ray shows no infiltrate or other acute findings.  Presentation is consistent with COPD exacerbation.  The patient states that she would like to go home.  We will prescribe prednisone for the next 4 days, and instructed her to use her albuterol every 4-6 hours for the next several days.  Return precautions given, and she expresses understanding.   Krystal Lee, Krystal Marando, MD 07/09/18 [phone]

## 2018-07-09 NOTE — ED Provider Notes (Signed)
[facility] Emergency Department Provider Note       Time seen: ----------------------------------------- 2:34 PM on 07/09/2018 -----------------------------------------   I have reviewed the triage vital signs and the nursing notes.  HISTORY   Chief Complaint Shortness of Breath    HPI Damian Leavellrudy Hedy CamaraFaye Sheu is a 62 y.o. female with a history of anxiety, COPD, depression, hepatitis C, hypertension who presents to the ED for shortness of breath.  Patient arrives via EMS for difficulty breathing for the last month.  Patient states shortness of breath has gotten increasingly worse.  She went to her primary care doctor 1 month ago and was given antibiotic prescription and prednisone.  She received a DuoNeb in route with some improvement in her shortness of breath.  She denies fevers, chills or other complaints.  Past Medical History:  Diagnosis Date  . Anxiety   . COPD (chronic obstructive pulmonary disease) (HCC)   . Depression   . GERD (gastroesophageal reflux disease)   . Hepatitis C virus   . Hypertension   . Post-menopausal     Patient Active Problem List   Diagnosis Date Noted  . Acute on chronic respiratory failure with hypoxia and hypercapnia (HCC) 09/16/2017  . COPD with acute exacerbation (HCC) 09/16/2017    Past Surgical History:  Procedure Laterality Date  . CATARACT EXTRACTION  2010   right     Allergies Patient has no known allergies.  Social History Social History   Tobacco Use  . Smoking status: Current Every Day Smoker    Packs/day: 1.00    Years: 43.00    Pack years: 43.00    Types: Cigarettes  . Smokeless tobacco: Never Used  Substance Use Topics  . Alcohol use: Yes    Alcohol/week: 1.0 standard drinks    Types: 1 Cans of beer per week  . Drug use: No   Review of Systems Constitutional: Negative for fever. Cardiovascular: Negative for chest pain. Respiratory: Positive for shortness of breath, cough Gastrointestinal:  Negative for abdominal pain, vomiting and diarrhea. Musculoskeletal: Negative for back pain. Skin: Negative for rash. Neurological: Negative for headaches, focal weakness or numbness.  All systems negative/normal/unremarkable except as stated in the HPI  ____________________________________________   PHYSICAL EXAM:  VITAL SIGNS: ED Triage Vitals  Enc Vitals Group     BP      Pulse      Resp      Temp      Temp src      SpO2      Weight      Height      Head Circumference      Peak Flow      Pain Score      Pain Loc      Pain Edu?      Excl. in GC?    Constitutional: Alert and oriented.  No acute distress Eyes: Conjunctivae are normal. Normal extraocular movements. ENT   Head: Normocephalic and atraumatic.   Nose: No congestion/rhinnorhea.   Mouth/Throat: Mucous membranes are moist.   Neck: No stridor. Cardiovascular: Normal rate, regular rhythm. No murmurs, rubs, or gallops. Respiratory: Normal respiratory effort with bilateral wheezing and rhonchi Gastrointestinal: Soft and nontender. Normal bowel sounds Musculoskeletal: Nontender with normal range of motion in extremities. No lower extremity tenderness nor edema. Neurologic:  Normal speech and language. No gross focal neurologic deficits are appreciated.  Skin:  Skin is warm, dry and intact. No rash noted. Psychiatric: Mood and affect are normal. Speech and  behavior are normal.  ____________________________________________  EKG: Interpreted by me.  Sinus rhythm rate of 94 bpm, normal PR interval, normal QRS, normal QT, nonspecific ST segment changes  ____________________________________________  ED COURSE:  As part of my medical decision making, I reviewed the following data within the electronic MEDICAL RECORD NUMBER History obtained from family if available, nursing notes, old chart and ekg, as well as notes from prior ED visits. Patient presented for dyspnea and likely COPD exacerbation, we will assess  with labs and imaging as indicated at this time.   Procedures ____________________________________________   LABS (pertinent positives/negatives)  Labs Reviewed  CBC WITH DIFFERENTIAL/PLATELET  BASIC METABOLIC PANEL  BRAIN NATRIURETIC PEPTIDE  TROPONIN I    RADIOLOGY  Chest x-ray Is pending at this time ____________________________________________  DIFFERENTIAL DIAGNOSIS   COPD, pneumonia, PE, pneumothorax, MI  FINAL ASSESSMENT AND PLAN  COPD exacerbation   Plan: The patient had presented for shortness of breath. Patient's labs and imaging are pending at this time as well as her final disposition.   Ulice DashJohnathan E Georgine Wiltse, MD   Note: This note was generated in part or whole with voice recognition software. Voice recognition is usually quite accurate but there are transcription errors that can and very often do occur. I apologize for any typographical errors that were not detected and corrected.     Emily FilbertWilliams, Naara Kelty E, MD 07/09/18 1452

## 2018-07-09 NOTE — Discharge Instructions (Addendum)
Take the prednisone as prescribed starting tomorrow and finish the full course.  Continue to use your albuterol either by nebulizer or inhaler every 4-6 hours for the next several days.  Return to the ER immediately for new, worsening, or persistent severe difficulty breathing, chest pain, fevers, weakness, or any other new or worsening symptoms that concern you.

## 2018-07-09 NOTE — ED Triage Notes (Signed)
Pt arrived via ems for report of difficulty breathing for the last month - pt states that the shortness of breath has gotten increasingly worse - she went to PCP one month ago and was given atb/prednisone

## 2018-07-28 ENCOUNTER — Emergency Department: Payer: Medicare HMO

## 2018-07-28 ENCOUNTER — Inpatient Hospital Stay
Admission: EM | Admit: 2018-07-28 | Discharge: 2018-07-30 | DRG: 191 | Disposition: A | Discharge status: Home / Self Care | Payer: Medicare HMO | Attending: Internal Medicine | Admitting: Internal Medicine

## 2018-07-28 ENCOUNTER — Other Ambulatory Visit: Payer: Self-pay

## 2018-07-28 ENCOUNTER — Encounter: Payer: Self-pay | Admitting: Emergency Medicine

## 2018-07-28 DIAGNOSIS — Z9981 Dependence on supplemental oxygen: Secondary | ICD-10-CM

## 2018-07-28 DIAGNOSIS — Z8249 Family history of ischemic heart disease and other diseases of the circulatory system: Secondary | ICD-10-CM | POA: Diagnosis not present

## 2018-07-28 DIAGNOSIS — F17210 Nicotine dependence, cigarettes, uncomplicated: Secondary | ICD-10-CM | POA: Diagnosis present

## 2018-07-28 DIAGNOSIS — Z7951 Long term (current) use of inhaled steroids: Secondary | ICD-10-CM | POA: Diagnosis not present

## 2018-07-28 DIAGNOSIS — Z66 Do not resuscitate: Secondary | ICD-10-CM | POA: Diagnosis not present

## 2018-07-28 DIAGNOSIS — Z716 Tobacco abuse counseling: Secondary | ICD-10-CM

## 2018-07-28 DIAGNOSIS — Z79890 Hormone replacement therapy (postmenopausal): Secondary | ICD-10-CM | POA: Diagnosis not present

## 2018-07-28 DIAGNOSIS — F419 Anxiety disorder, unspecified: Secondary | ICD-10-CM | POA: Diagnosis present

## 2018-07-28 DIAGNOSIS — Z79891 Long term (current) use of opiate analgesic: Secondary | ICD-10-CM

## 2018-07-28 DIAGNOSIS — Z79899 Other long term (current) drug therapy: Secondary | ICD-10-CM

## 2018-07-28 DIAGNOSIS — F329 Major depressive disorder, single episode, unspecified: Secondary | ICD-10-CM | POA: Diagnosis present

## 2018-07-28 DIAGNOSIS — K219 Gastro-esophageal reflux disease without esophagitis: Secondary | ICD-10-CM | POA: Diagnosis present

## 2018-07-28 DIAGNOSIS — J441 Chronic obstructive pulmonary disease with (acute) exacerbation: Secondary | ICD-10-CM | POA: Diagnosis not present

## 2018-07-28 DIAGNOSIS — E785 Hyperlipidemia, unspecified: Secondary | ICD-10-CM | POA: Diagnosis present

## 2018-07-28 DIAGNOSIS — J9611 Chronic respiratory failure with hypoxia: Secondary | ICD-10-CM | POA: Diagnosis present

## 2018-07-28 DIAGNOSIS — Z9841 Cataract extraction status, right eye: Secondary | ICD-10-CM | POA: Diagnosis not present

## 2018-07-28 DIAGNOSIS — I10 Essential (primary) hypertension: Secondary | ICD-10-CM | POA: Diagnosis present

## 2018-07-28 LAB — CBC WITH DIFFERENTIAL/PLATELET
Basophils Absolute: 0.1 10*3/uL (ref 0–0.1)
Basophils Relative: 1 %
EOS ABS: 0.3 10*3/uL (ref 0–0.7)
Eosinophils Relative: 3 %
HEMATOCRIT: 39.6 % (ref 35.0–47.0)
HEMOGLOBIN: 13.6 g/dL (ref 12.0–16.0)
LYMPHS ABS: 2.6 10*3/uL (ref 1.0–3.6)
Lymphocytes Relative: 28 %
MCH: 32.7 pg (ref 26.0–34.0)
MCHC: 34.3 g/dL (ref 32.0–36.0)
MCV: 95.3 fL (ref 80.0–100.0)
MONOS PCT: 7 %
Monocytes Absolute: 0.7 10*3/uL (ref 0.2–0.9)
NEUTROS ABS: 5.9 10*3/uL (ref 1.4–6.5)
NEUTROS PCT: 61 %
Platelets: 160 10*3/uL (ref 150–440)
RBC: 4.16 MIL/uL (ref 3.80–5.20)
RDW: 15.1 % — ABNORMAL HIGH (ref 11.5–14.5)
WBC: 9.5 10*3/uL (ref 3.6–11.0)

## 2018-07-28 LAB — BASIC METABOLIC PANEL
ANION GAP: 7 (ref 5–15)
BUN: 11 mg/dL (ref 6–20)
CHLORIDE: 110 mmol/L (ref 98–111)
CO2: 22 mmol/L (ref 22–32)
Calcium: 8.5 mg/dL — ABNORMAL LOW (ref 8.9–10.3)
Creatinine, Ser: 0.5 mg/dL (ref 0.44–1.00)
GFR calc Af Amer: >60 mL/min (ref >60)
Glucose, Bld: 124 mg/dL — ABNORMAL HIGH (ref 70–99)
POTASSIUM: 3.6 mmol/L (ref 3.5–5.1)
SODIUM: 139 mmol/L (ref 135–145)

## 2018-07-28 LAB — BRAIN NATRIURETIC PEPTIDE: B NATRIURETIC PEPTIDE 5: 24 pg/mL (ref 0.0–100.0)

## 2018-07-28 LAB — TROPONIN I

## 2018-07-28 MED ORDER — IPRATROPIUM-ALBUTEROL 0.5-2.5 (3) MG/3ML IN SOLN
3.0000 mL | RESPIRATORY_TRACT | Status: DC | PRN
  Administered 2018-07-28 – 2018-07-29 (×3): 3 mL via RESPIRATORY_TRACT
  Filled 2018-07-28 (×3): qty 3

## 2018-07-28 MED ORDER — ROSUVASTATIN CALCIUM 20 MG PO TABS
40.0000 mg | ORAL_TABLET | Freq: Every day | ORAL | Status: DC
  Administered 2018-07-28 – 2018-07-30 (×3): 40 mg via ORAL
  Filled 2018-07-28 (×3): qty 2

## 2018-07-28 MED ORDER — METHYLPREDNISOLONE SODIUM SUCC 125 MG IJ SOLR
60.0000 mg | Freq: Four times a day (QID) | INTRAMUSCULAR | Status: DC
  Administered 2018-07-28 – 2018-07-29 (×3): 60 mg via INTRAVENOUS
  Filled 2018-07-28 (×3): qty 2

## 2018-07-28 MED ORDER — DOCUSATE SODIUM 100 MG PO CAPS
100.0000 mg | ORAL_CAPSULE | Freq: Two times a day (BID) | ORAL | Status: DC | PRN
  Administered 2018-07-30: 10:00:00 100 mg via ORAL
  Filled 2018-07-28: qty 1

## 2018-07-28 MED ORDER — BUDESONIDE 0.25 MG/2ML IN SUSP
0.2500 mg | Freq: Two times a day (BID) | RESPIRATORY_TRACT | Status: DC
  Administered 2018-07-28 – 2018-07-29 (×2): 0.25 mg via RESPIRATORY_TRACT
  Filled 2018-07-28 (×2): qty 2

## 2018-07-28 MED ORDER — ALPRAZOLAM 0.5 MG PO TABS
0.5000 mg | ORAL_TABLET | Freq: Four times a day (QID) | ORAL | Status: DC | PRN
  Administered 2018-07-28 – 2018-07-30 (×7): 0.5 mg via ORAL
  Filled 2018-07-28 (×7): qty 1

## 2018-07-28 MED ORDER — CYCLOBENZAPRINE HCL 10 MG PO TABS: 10.0000 mg | ORAL_TABLET | Freq: Every day | ORAL | Status: DC | PRN

## 2018-07-28 MED ORDER — IPRATROPIUM-ALBUTEROL 0.5-2.5 (3) MG/3ML IN SOLN
3.0000 mL | Freq: Once | RESPIRATORY_TRACT | Status: AC
  Administered 2018-07-28: 3 mL via RESPIRATORY_TRACT
  Filled 2018-07-28: qty 3

## 2018-07-28 MED ORDER — ORAL CARE MOUTH RINSE
15.0000 mL | Freq: Two times a day (BID) | OROMUCOSAL | Status: DC
  Administered 2018-07-28 – 2018-07-30 (×3): 15 mL via OROMUCOSAL

## 2018-07-28 MED ORDER — METOPROLOL SUCCINATE ER 50 MG PO TB24
50.0000 mg | ORAL_TABLET | Freq: Every day | ORAL | Status: DC
  Administered 2018-07-29 – 2018-07-30 (×2): 50 mg via ORAL
  Filled 2018-07-28 (×2): qty 1

## 2018-07-28 MED ORDER — GUAIFENESIN-DM 100-10 MG/5ML PO SYRP
5.0000 mL | ORAL_SOLUTION | ORAL | Status: DC | PRN
  Administered 2018-07-29: 5 mL via ORAL
  Filled 2018-07-28 (×2): qty 5

## 2018-07-28 MED ORDER — HEPARIN SODIUM (PORCINE) 5000 UNIT/ML IJ SOLN
5000.0000 [IU] | Freq: Three times a day (TID) | INTRAMUSCULAR | Status: DC
  Administered 2018-07-28 – 2018-07-30 (×6): 5000 [IU] via SUBCUTANEOUS
  Filled 2018-07-28 (×6): qty 1

## 2018-07-28 MED ORDER — METHYLPREDNISOLONE SODIUM SUCC 125 MG IJ SOLR: 125.0000 mg | Freq: Once | INTRAMUSCULAR | Status: DC

## 2018-07-28 MED ORDER — PANTOPRAZOLE SODIUM 40 MG PO TBEC
40.0000 mg | DELAYED_RELEASE_TABLET | Freq: Every day | ORAL | Status: DC
  Administered 2018-07-29 – 2018-07-30 (×2): 40 mg via ORAL
  Filled 2018-07-28 (×2): qty 1

## 2018-07-28 MED ORDER — LEVOFLOXACIN 500 MG PO TABS
500.0000 mg | ORAL_TABLET | Freq: Every day | ORAL | Status: DC
  Administered 2018-07-28 – 2018-07-30 (×3): 500 mg via ORAL
  Filled 2018-07-28 (×3): qty 1

## 2018-07-28 MED ORDER — QUETIAPINE FUMARATE 25 MG PO TABS
100.0000 mg | ORAL_TABLET | Freq: Every day | ORAL | Status: DC
  Administered 2018-07-28 – 2018-07-29 (×2): 100 mg via ORAL
  Filled 2018-07-28 (×2): qty 4

## 2018-07-28 MED ORDER — MOMETASONE FURO-FORMOTEROL FUM 200-5 MCG/ACT IN AERO
2.0000 | INHALATION_SPRAY | Freq: Two times a day (BID) | RESPIRATORY_TRACT | Status: DC
  Administered 2018-07-28 – 2018-07-30 (×4): 2 via RESPIRATORY_TRACT
  Filled 2018-07-28: qty 8.8

## 2018-07-28 MED ORDER — ESTROGENS CONJUGATED 0.3 MG PO TABS
0.3000 mg | ORAL_TABLET | Freq: Every day | ORAL | Status: DC
  Administered 2018-07-29 – 2018-07-30 (×2): 0.3 mg via ORAL
  Filled 2018-07-28 (×3): qty 1

## 2018-07-28 NOTE — ED Provider Notes (Signed)
[facility] Emergency Department Provider Note ____________________________________________   I have reviewed the triage vital signs and the triage nursing note.  HISTORY  Chief Complaint Shortness of Breath   Historian Patient  HPI Krystal Lee is a 62 y.o. female with history of COPD, on home 2 L nasal cannula, presents with  shortness of breath over the past 2 days, worsening today.  No fever.  Cough productive of honey colored sputum.  No blood.  Has been taking multiple nebulized treatments at home.  Not improving.  History of multiple hospitalizations for COPD.  Symptoms are moderate.    Past Medical History:  Diagnosis Date  . Anxiety   . COPD (chronic obstructive pulmonary disease) (HCC)   . Depression   . GERD (gastroesophageal reflux disease)   . Hepatitis C virus   . Hypertension   . Post-menopausal     Patient Active Problem List   Diagnosis Date Noted  . Acute on chronic respiratory failure with hypoxia and hypercapnia (HCC) 09/16/2017  . COPD with acute exacerbation (HCC) 09/16/2017    Past Surgical History:  Procedure Laterality Date  . CATARACT EXTRACTION  2010   right     Prior to Admission medications   Medication Sig Start Date End Date Taking? Authorizing Provider  alendronate (FOSAMAX) 70 MG tablet Take 70 mg by mouth once a week.  08/17/17  Yes [provider]  CRESTOR 40 MG tablet Take 40 mg by mouth daily.  08/17/17  Yes [provider]  cyclobenzaprine (FLEXERIL) 10 MG tablet Take 1 tablet by mouth daily as needed. 07/15/18  Yes [provider]  medroxyPROGESTERone (PROVERA) 2.5 MG tablet Only take 5 days at the end of the month. 08/17/17  Yes [provider]  metoprolol succinate (TOPROL-XL) 50 MG 24 hr tablet Take 50 mg by mouth daily.  08/22/17  Yes [provider]  pantoprazole (PROTONIX) 40 MG tablet Take 40 mg by mouth daily. 08/17/17  Yes [provider]   PREMARIN 0.3 MG tablet Take 0.3 mg by mouth daily.  08/17/17  Yes [provider]  QUEtiapine (SEROQUEL) 100 MG tablet Take 100 mg by mouth at bedtime.  08/17/17  Yes [provider]  ALPRAZolam Prudy Feeler) 0.5 MG tablet Take 0.5 mg by mouth 4 (four) times daily as needed.  08/08/17   [provider]  azithromycin (ZITHROMAX Z-PAK) 250 MG tablet Take 2 tablets (500 mg) on  Day 1,  followed by 1 tablet (250 mg) once daily on Days 2 through 5. Patient not taking: Reported on 07/28/2018 02/27/18   Jeanmarie Plant, MD  budesonide (PULMICORT) 0.25 MG/2ML nebulizer solution Take 2 mLs (0.25 mg total) by nebulization 2 (two) times daily. Patient not taking: Reported on 07/28/2018 09/18/17   Altamese Dilling, MD  guaiFENesin-dextromethorphan [facility] DM) 100-10 MG/5ML syrup Take 5 mLs by mouth every 4 (four) hours as needed for cough. Patient not taking: Reported on 07/28/2018 09/18/17   Altamese Dilling, MD  ipratropium-albuterol (DUONEB) 0.5-2.5 (3) MG/3ML SOLN Take 3 mLs by nebulization every 6 (six) hours as needed. Patient not taking: Reported on 07/28/2018 09/18/17   Altamese Dilling, MD  PROAIR HFA 108 [id number] Base) MCG/ACT inhaler Inhale 1-2 puffs into the lungs every 6 (six) hours as needed.  08/17/17   [provider]  SYMBICORT 160-4.5 MCG/ACT inhaler Inhale 2 puffs into the lungs 2 (two) times daily.  08/17/17   [provider]  tiotropium (SPIRIVA) 18 MCG inhalation capsule Place 1  capsule (18 mcg total) into inhaler and inhale daily. Patient not taking: Reported on 03/16/2018 09/19/17   Altamese Dilling, MD  traMADol (ULTRAM) 50 MG tablet Take 1 tablet (50 mg total) by mouth every 6 (six) hours as needed. Patient not taking: Reported on 03/16/2018 01/13/18   Minna Antis, MD    No Known Allergies  Family History  Problem Relation Age of Onset  . Breast cancer Paternal Grandmother   . Bone cancer Paternal Grandmother   . Pancreatic  cancer Mother   . Heart disease Father   . Heart disease Paternal Uncle   . Stroke Paternal Grandfather     Social History Social History   Tobacco Use  . Smoking status: Current Every Day Smoker    Packs/day: 1.00    Years: 43.00    Pack years: 43.00    Types: Cigarettes  . Smokeless tobacco: Never Used  Substance Use Topics  . Alcohol use: Yes    Alcohol/week: 1.0 standard drinks    Types: 1 Cans of beer per week  . Drug use: No    Review of Systems  Constitutional: Negative for fever. Eyes: Negative for visual changes. ENT: Negative for sore throat. Cardiovascular: Negative for chest pain. Respiratory: Positive for shortness of breath. Gastrointestinal: Negative for abdominal pain, vomiting and diarrhea. Genitourinary: Negative for dysuria. Musculoskeletal: Negative for back pain. Skin: Negative for rash. Neurological: Negative for headache.  ____________________________________________   PHYSICAL EXAM:  VITAL SIGNS: ED Triage Vitals  Enc Vitals Group     BP 07/28/18 1612 (!) 161/91     Pulse Rate 07/28/18 1612 85     Resp 07/28/18 1612 (!) 22     Temp 07/28/18 1612 98 F (36.7 C)     Temp Source 07/28/18 1612 Oral     SpO2 07/28/18 1612 94 %     Weight 07/28/18 1614 136 lb (61.7 kg)     Height 07/28/18 1614 4\' 11"  (1.499 m)     Head Circumference --      Peak Flow --      Pain Score 07/28/18 1613 0     Pain Loc --      Pain Edu? --      Excl. in GC? --      Constitutional: Alert and oriented.  HEENT      Head: Normocephalic and atraumatic.      Eyes: Conjunctivae are normal. Pupils equal and round.       Ears:         Nose: No congestion/rhinnorhea.      Mouth/Throat: Mucous membranes are moist.      Neck: No stridor. Cardiovascular/Chest: Normal rate, regular rhythm.  No murmurs, rubs, or gallops. Respiratory: Normal respiratory effort without tachypnea nor retractions.  Severe wheezing in all lung fields with some decreased breath sounds  throughout.  Mild rhonchi at the bases. Gastrointestinal: Soft. No distention, no guarding, no rebound. Nontender.    Genitourinary/rectal:Deferred Musculoskeletal: Nontender with normal range of motion in all extremities. No joint effusions.  No lower extremity tenderness.  Trace lower extremity edema. Neurologic:  Normal speech and language. No gross or focal neurologic deficits are appreciated. Skin:  Skin is warm, dry and intact. No rash noted. Psychiatric: Mood and affect are normal. Speech and behavior are normal. Patient exhibits appropriate insight and judgment.   ____________________________________________  LABS (pertinent positives/negatives) I, Governor Rooks, MD the attending physician have reviewed the labs noted below.  Labs Reviewed  BASIC METABOLIC PANEL  TROPONIN  I  CBC WITH DIFFERENTIAL/PLATELET  BRAIN NATRIURETIC PEPTIDE    ____________________________________________    EKG I, Governor Rooks, MD, the attending physician have personally viewed and interpreted all ECGs.  81 bpm.  Normal sinus rhythm.  Narrow QRS.  Normal axis.  Minimal sT segment elevation 2 and 3 appears to be J-point elevation. ____________________________________________  RADIOLOGY   Chest x-ray reviewed by myself, reviewed radiologist interpretation:  IMPRESSION: No active disease.  Mild bibasilar atelectasis. __________________________________________  PROCEDURES  Procedure(s) performed: None  Procedures  Critical Care performed: None   ____________________________________________  ED COURSE / ASSESSMENT AND PLAN  Pertinent labs & imaging results that were available during my care of the patient were reviewed by me and considered in my medical decision making (see chart for details).    Patient with significant acute on chronic COPD exacerbation, requiring immediate DuoNeb treatment as well as IV Solu-Medrol.  Patient took multiple DuoNeb treatments prior to arrival and 2  additional DuoNeb treatments here in the emergency department still extremely wheezy.  Patient is going need hospital management.  Awaiting laboratory studies.  Patient care transferred to nurse practitioner Loralyn Freshwater Triplett at shift change 6 PM.  Reviewed white blood cell count normal.  Chest x-ray without pneumonia.  Will sign out to hospitalist for admission for COPD exacerbation.   CONSULTATIONS:   Hospitalist for admission.   Patient / Family / Caregiver informed of clinical course, medical decision-making process, and agree with plan.   ___________________________________________   FINAL CLINICAL IMPRESSION(S) / ED DIAGNOSES   Final diagnoses:  COPD exacerbation (HCC)      ___________________________________________         Note: This dictation was prepared with Dragon dictation. Any transcriptional errors that result from this process are unintentional    Governor Rooks, MD 07/28/18 [phone]

## 2018-07-28 NOTE — H&P (Signed)
[facility] [place] [facility]   PATIENT NAME: Krystal Lee    MR#:  [id number]  DATE OF BIRTH:  1956-03-27  DATE OF ADMISSION:  07/28/2018  PRIMARY CARE PHYSICIAN: Armando Gang, FNP   REQUESTING/REFERRING PHYSICIAN: lord  CHIEF COMPLAINT:   Chief Complaint  Patient presents with  . Shortness of Breath    HISTORY OF PRESENT ILLNESS: Berlene Claude  is a 62 y.o. female with a known history of COPD, anxiety, depression, gastroesophageal reflux disease, hepatitis C virus, hypertension-on 2 L of oxygen chronically. For last 3 to 4 days have worsening shortness of breath with some cough and yellowish sputum production.  She went to her primary care doctor's office where they noted her to be hypoxic and advised her to go to emergency room. Chest x-ray does not show any pneumonia but patient was noted to be wheezing and advised to admit to hospitalist service for further management.  PAST MEDICAL HISTORY:   Past Medical History:  Diagnosis Date  . Anxiety   . COPD (chronic obstructive pulmonary disease) (HCC)   . Depression   . GERD (gastroesophageal reflux disease)   . Hepatitis C virus   . Hypertension   . Post-menopausal     PAST SURGICAL HISTORY:  Past Surgical History:  Procedure Laterality Date  . CATARACT EXTRACTION  2010   right     SOCIAL HISTORY:  Social History   Tobacco Use  . Smoking status: Current Every Day Smoker    Packs/day: 1.00    Years: 43.00    Pack years: 43.00    Types: Cigarettes  . Smokeless tobacco: Never Used  Substance Use Topics  . Alcohol use: Yes    Alcohol/week: 1.0 standard drinks    Types: 1 Cans of beer per week    FAMILY HISTORY:  Family History  Problem Relation Age of Onset  . Breast cancer Paternal Grandmother   . Bone cancer Paternal Grandmother   . Pancreatic cancer Mother   . Heart disease Father   . Heart disease Paternal Uncle   . Stroke Paternal Grandfather     DRUG ALLERGIES: No Known  Allergies  REVIEW OF SYSTEMS:   CONSTITUTIONAL: No fever, fatigue or weakness.  EYES: No blurred or double vision.  EARS, NOSE, AND THROAT: No tinnitus or ear pain.  RESPIRATORY: She has cough, shortness of breath, wheezing, no hemoptysis.  CARDIOVASCULAR: No chest pain, orthopnea, edema.  GASTROINTESTINAL: No nausea, vomiting, diarrhea or abdominal pain.  GENITOURINARY: No dysuria, hematuria.  ENDOCRINE: No polyuria, nocturia,  HEMATOLOGY: No anemia, easy bruising or bleeding SKIN: No rash or lesion. MUSCULOSKELETAL: No joint pain or arthritis.   NEUROLOGIC: No tingling, numbness, weakness.  PSYCHIATRY: No anxiety or depression.   MEDICATIONS AT HOME:  Prior to Admission medications   Medication Sig Start Date End Date Taking? Authorizing Provider  alendronate (FOSAMAX) 70 MG tablet Take 70 mg by mouth once a week.  08/17/17  Yes [provider]  CRESTOR 40 MG tablet Take 40 mg by mouth daily.  08/17/17  Yes [provider]  cyclobenzaprine (FLEXERIL) 10 MG tablet Take 1 tablet by mouth daily as needed. 07/15/18  Yes [provider]  medroxyPROGESTERone (PROVERA) 2.5 MG tablet Only take 5 days at the end of the month. 08/17/17  Yes [provider]  metoprolol succinate (TOPROL-XL) 50 MG 24 hr tablet Take 50 mg by mouth daily.  08/22/17  Yes [provider]  pantoprazole (PROTONIX) 40 MG tablet Take  40 mg by mouth daily. 08/17/17  Yes [provider]  PREMARIN 0.3 MG tablet Take 0.3 mg by mouth daily.  08/17/17  Yes [provider]  QUEtiapine (SEROQUEL) 100 MG tablet Take 100 mg by mouth at bedtime.  08/17/17  Yes [provider]  ALPRAZolam Prudy Feeler) 0.5 MG tablet Take 0.5 mg by mouth 4 (four) times daily as needed.  08/08/17   [provider]  azithromycin (ZITHROMAX Z-PAK) 250 MG tablet Take 2 tablets (500 mg) on  Day 1,  followed by 1 tablet (250 mg) once daily on Days 2 through 5. Patient not taking: Reported  on 07/28/2018 02/27/18   Jeanmarie Plant, MD  budesonide (PULMICORT) 0.25 MG/2ML nebulizer solution Take 2 mLs (0.25 mg total) by nebulization 2 (two) times daily. Patient not taking: Reported on 07/28/2018 09/18/17   Altamese Dilling, MD  guaiFENesin-dextromethorphan [facility] DM) 100-10 MG/5ML syrup Take 5 mLs by mouth every 4 (four) hours as needed for cough. Patient not taking: Reported on 07/28/2018 09/18/17   Altamese Dilling, MD  ipratropium-albuterol (DUONEB) 0.5-2.5 (3) MG/3ML SOLN Take 3 mLs by nebulization every 6 (six) hours as needed. Patient not taking: Reported on 07/28/2018 09/18/17   Altamese Dilling, MD  PROAIR HFA 108 [phone] Base) MCG/ACT inhaler Inhale 1-2 puffs into the lungs every 6 (six) hours as needed.  08/17/17   [provider]  SYMBICORT 160-4.5 MCG/ACT inhaler Inhale 2 puffs into the lungs 2 (two) times daily.  08/17/17   [provider]  tiotropium (SPIRIVA) 18 MCG inhalation capsule Place 1 capsule (18 mcg total) into inhaler and inhale daily. Patient not taking: Reported on 03/16/2018 09/19/17   Altamese Dilling, MD  traMADol (ULTRAM) 50 MG tablet Take 1 tablet (50 mg total) by mouth every 6 (six) hours as needed. Patient not taking: Reported on 03/16/2018 01/13/18   Minna Antis, MD      PHYSICAL EXAMINATION:   VITAL SIGNS: Blood pressure (!) 161/91, pulse 85, temperature 98 F (36.7 C), temperature source Oral, resp. rate (!) 22, height 4\' 11"  (1.499 m), weight 61.7 kg, SpO2 94 %.  GENERAL:  62 y.o.-year-old patient lying in the bed with no acute distress.  EYES: Pupils equal, round, reactive to light and accommodation. No scleral icterus. Extraocular muscles intact.  HEENT: Head atraumatic, normocephalic. Oropharynx and nasopharynx clear.  NECK:  Supple, no jugular venous distention. No thyroid enlargement, no tenderness.  LUNGS: Normal breath sounds bilaterally, some wheezing, no crepitation. No use of accessory muscles of  respiration.  CARDIOVASCULAR: S1, S2 normal. No murmurs, rubs, or gallops.  ABDOMEN: Soft, nontender, nondistended. Bowel sounds present. No organomegaly or mass.  EXTREMITIES: No pedal edema, cyanosis, or clubbing.  NEUROLOGIC: Cranial nerves II through XII are intact. Muscle strength 5/5 in all extremities. Sensation intact. Gait not checked.  PSYCHIATRIC: The patient is alert and oriented x 3.  SKIN: No obvious rash, lesion, or ulcer.   LABORATORY PANEL:   CBC Recent Labs  Lab 07/28/18 1617  WBC 9.5  HGB 13.6  HCT 39.6  PLT 160  MCV 95.3  MCH 32.7  MCHC 34.3  RDW 15.1*  LYMPHSABS 2.6  MONOABS 0.7  EOSABS 0.3  BASOSABS 0.1   ------------------------------------------------------------------------------------------------------------------  Chemistries  No results for input(s): NA, K, CL, CO2, GLUCOSE, BUN, CREATININE, CALCIUM, MG, AST, ALT, ALKPHOS, BILITOT in the last 168 hours.  Invalid input(s): GFRCGP ------------------------------------------------------------------------------------------------------------------ estimated creatinine clearance is 59.7 mL/min (by C-G formula based on SCr of 0.59 mg/dL). ------------------------------------------------------------------------------------------------------------------ No results for  input(s): TSH, T4TOTAL, T3FREE, THYROIDAB in the last 72 hours.  Invalid input(s): FREET3   Coagulation profile No results for input(s): INR, PROTIME in the last 168 hours. ------------------------------------------------------------------------------------------------------------------- No results for input(s): DDIMER in the last 72 hours. -------------------------------------------------------------------------------------------------------------------  Cardiac Enzymes No results for input(s): CKMB, TROPONINI, MYOGLOBIN in the last 168 hours.  Invalid input(s):  CK ------------------------------------------------------------------------------------------------------------------ Invalid input(s): POCBNP  ---------------------------------------------------------------------------------------------------------------  Urinalysis    Component Value Date/Time   COLORURINE STRAW (A) 03/17/2018 0055   APPEARANCEUR CLEAR (A) 03/17/2018 0055   APPEARANCEUR Clear 10/29/2013 2004   LABSPEC 1.004 (L) 03/17/2018 0055   LABSPEC 1.004 10/29/2013 2004   PHURINE 6.0 03/17/2018 0055   GLUCOSEU NEGATIVE 03/17/2018 0055   GLUCOSEU Negative 10/29/2013 2004   HGBUR NEGATIVE 03/17/2018 0055   BILIRUBINUR NEGATIVE 03/17/2018 0055   BILIRUBINUR Negative 10/29/2013 2004   KETONESUR NEGATIVE 03/17/2018 0055   PROTEINUR NEGATIVE 03/17/2018 0055   UROBILINOGEN 1.0 11/07/2013 1053   NITRITE NEGATIVE 03/17/2018 0055   LEUKOCYTESUR NEGATIVE 03/17/2018 0055   LEUKOCYTESUR Negative 10/29/2013 2004     RADIOLOGY: Dg Chest Port 1 View  Result Date: 07/28/2018 CLINICAL DATA:  Shortness of breath EXAM: PORTABLE CHEST 1 VIEW COMPARISON:  07/09/2018 FINDINGS: Linear atelectasis at the bases. No focal consolidation or effusion. Stable cardiomediastinal silhouette with aortic atherosclerosis. No pneumothorax. IMPRESSION: No active disease.  Mild bibasilar atelectasis. Electronically Signed   By: Jasmine Pang M.D.   On: 07/28/2018 16:59    EKG: Orders placed or performed during the hospital encounter of 07/28/18  . ED EKG  . ED EKG  . EKG 12-Lead  . EKG 12-Lead    IMPRESSION AND PLAN:  *Acute COPD exacerbation IV steroid, antibiotics, nebulizer therapy, nebulizer steroid Incentive spirometer.  *Chronic respiratory failure On 2 L oxygen at home, continue the same.  *Hypertension Continue the metoprolol.  *Hyperlipidemia Continue Crestor.  *Active smoking Counseled to quit smoking for 4 minutes and offered nicotine patch.   All the records are reviewed and  case discussed with ED provider. Management plans discussed with the patient, family and they are in agreement.  CODE STATUS: Limited code. In adverse events, patient want to have CPR and defibrillator use but does not want intubation or ventilatory support.  Code Status History    Date Active Date Inactive Code Status Order ID Comments User Context   09/16/2017 0834 09/18/2017 1830 Full Code [id number]  Altamese Dilling, MD Inpatient       TOTAL TIME TAKING CARE OF THIS PATIENT: 45 minutes.    Altamese Dilling M.D on 07/28/2018   Between 7am to 6pm - Pager - [phone]  After 6pm go to [email or web address] - password [email or web address]  [facility]  Office  [phone]  CC: Primary care physician; Armando Gang, FNP   Note: This dictation was prepared with Dragon dictation along with smaller phrase technology. Any transcriptional errors that result from this process are unintentional.

## 2018-07-28 NOTE — Progress Notes (Signed)
Family Meeting Note  Advance Directive:yes  Today a meeting took place with the Patient.  The following clinical team members were present during this meeting:MD  The following were discussed:Patient's diagnosis: COPD, Smoking, Patient's progosis: Unable to determine and Goals for treatment: Continue present management  Patient want to have CPR and defibrillator use along with the medications, but she would not like to be intubated or put on the ventilatory support in any adverse event.  Additional follow-up to be provided: PMD  Time spent during discussion:20 minutes  Altamese Dilling, MD

## 2018-07-28 NOTE — ED Triage Notes (Signed)
Pt via ems from pcp office; pt was being seen for copd/sob. EMS reports that her ems sat was 76%; they gav her duoneb and called ems. EMS gave her 1 x albuterol treatment and 125 solumedrol via iv. Pt on 2L (is chronically on 2l) and sat increased to 95%. Hx copd; endorses productive cough. Pt alert & oriented; nad noted.

## 2018-07-29 LAB — BASIC METABOLIC PANEL
Anion gap: 8 (ref 5–15)
BUN: 11 mg/dL (ref 6–20)
CALCIUM: 8.7 mg/dL — AB (ref 8.9–10.3)
CO2: 22 mmol/L (ref 22–32)
CREATININE: 0.49 mg/dL (ref 0.44–1.00)
Chloride: 110 mmol/L (ref 98–111)
GFR calc Af Amer: >60 mL/min (ref >60)
GLUCOSE: 159 mg/dL — AB (ref 70–99)
Potassium: 3.2 mmol/L — ABNORMAL LOW (ref 3.5–5.1)
SODIUM: 140 mmol/L (ref 135–145)

## 2018-07-29 LAB — CBC
HCT: 38.2 % (ref 35.0–47.0)
Hemoglobin: 13 g/dL (ref 12.0–16.0)
MCH: 32.9 pg (ref 26.0–34.0)
MCHC: 34.1 g/dL (ref 32.0–36.0)
MCV: 96.3 fL (ref 80.0–100.0)
PLATELETS: 171 10*3/uL (ref 150–440)
RBC: 3.96 MIL/uL (ref 3.80–5.20)
RDW: 14.7 % — AB (ref 11.5–14.5)
WBC: 7.5 10*3/uL (ref 3.6–11.0)

## 2018-07-29 LAB — PROCALCITONIN

## 2018-07-29 MED ORDER — POTASSIUM CHLORIDE CRYS ER 20 MEQ PO TBCR
40.0000 meq | EXTENDED_RELEASE_TABLET | Freq: Once | ORAL | Status: AC
  Administered 2018-07-29: 13:00:00 40 meq via ORAL
  Filled 2018-07-29: qty 2

## 2018-07-29 MED ORDER — METHYLPREDNISOLONE SODIUM SUCC 125 MG IJ SOLR
60.0000 mg | Freq: Three times a day (TID) | INTRAMUSCULAR | Status: DC
  Administered 2018-07-29 – 2018-07-30 (×2): 60 mg via INTRAVENOUS
  Filled 2018-07-29 (×2): qty 2

## 2018-07-29 NOTE — Progress Notes (Signed)
[facility] - [place] at [facility]   PATIENT NAME: Saadiya Wilfong    MR#:  [id number]  DATE OF BIRTH:  04/09/1956  SUBJECTIVE:  CHIEF COMPLAINT:   Chief Complaint  Patient presents with  . Shortness of Breath  still very SOB. Requests if she needs to have BNP check REVIEW OF SYSTEMS:  Review of Systems  Constitutional: Negative for diaphoresis, fever, malaise/fatigue and weight loss.  HENT: Negative for ear discharge, ear pain, hearing loss, nosebleeds, sore throat and tinnitus.   Eyes: Negative for blurred vision and pain.  Respiratory: Positive for cough, shortness of breath and wheezing. Negative for hemoptysis.   Cardiovascular: Negative for chest pain, palpitations, orthopnea and leg swelling.  Gastrointestinal: Negative for abdominal pain, blood in stool, constipation, diarrhea, heartburn, nausea and vomiting.  Genitourinary: Negative for dysuria, frequency and urgency.  Musculoskeletal: Negative for back pain and myalgias.  Skin: Negative for itching and rash.  Neurological: Negative for dizziness, tingling, tremors, focal weakness, seizures, weakness and headaches.  Psychiatric/Behavioral: Negative for depression. The patient is not nervous/anxious.     DRUG ALLERGIES:  No Known Allergies VITALS:  Blood pressure (!) 145/86, pulse 100, temperature 98.1 F (36.7 C), temperature source Oral, resp. rate 20, height 4\' 11"  (1.499 m), weight 62.3 kg, SpO2 95 %. PHYSICAL EXAMINATION:  Physical Exam  Constitutional: She is oriented to person, place, and time.  HENT:  Head: Normocephalic and atraumatic.  Eyes: Pupils are equal, round, and reactive to light. Conjunctivae and EOM are normal.  Neck: Normal range of motion. Neck supple. No tracheal deviation present. No thyromegaly present.  Cardiovascular: Normal rate, regular rhythm and normal heart sounds.  Pulmonary/Chest: Effort normal. No respiratory distress. She has decreased breath sounds. She has  wheezes. She exhibits no tenderness.  Abdominal: Soft. Bowel sounds are normal. She exhibits no distension. There is no tenderness.  Musculoskeletal: Normal range of motion.  Neurological: She is alert and oriented to person, place, and time. No cranial nerve deficit.  Skin: Skin is warm and dry. No rash noted.   LABORATORY PANEL:  Female CBC Recent Labs  Lab 07/29/18 0342  WBC 7.5  HGB 13.0  HCT 38.2  PLT 171   ------------------------------------------------------------------------------------------------------------------ Chemistries  Recent Labs  Lab 07/29/18 0342  NA 140  K 3.2*  CL 110  CO2 22  GLUCOSE 159*  BUN 11  CREATININE 0.49  CALCIUM 8.7*   RADIOLOGY:  Dg Chest Port 1 View  Result Date: 07/28/2018 CLINICAL DATA:  Shortness of breath EXAM: PORTABLE CHEST 1 VIEW COMPARISON:  07/09/2018 FINDINGS: Linear atelectasis at the bases. No focal consolidation or effusion. Stable cardiomediastinal silhouette with aortic atherosclerosis. No pneumothorax. IMPRESSION: No active disease.  Mild bibasilar atelectasis. Electronically Signed   By: Jasmine Pang M.D.   On: 07/28/2018 16:59   ASSESSMENT AND PLAN:   *Acute COPD exacerbation - continue IV steroid, antibiotics, nebulizer therapy, nebulizer steroid Incentive spirometer.  *Chronic respiratory failure On 2 L oxygen at home, continue the same. - wean as tolerated  *Hypertension Continue the metoprolol.  *Hyperlipidemia Continue Crestor.  *Active smoking Counseled to quit smoking for 4 minutes and offered nicotine patch.     All the records are reviewed and case discussed with Care Management/Social Worker. Management plans discussed with the patient, nursing and they are in agreement.  CODE STATUS: Partial Code  TOTAL TIME TAKING CARE OF THIS PATIENT: 25 minutes.   More than 50% of the time was spent in counseling/coordination of care: YES  POSSIBLE D/C IN 1 DAYS, DEPENDING ON CLINICAL  CONDITION.   Delfino Lovett M.D on 07/29/2018 at 3:17 PM  Between 7am to 6pm - Pager - [phone]  After 6pm go to [email or web address] - Social research officer, government  [facility] [place] [facility]  Office  [phone]  CC: Primary care physician; Armando Gang, FNP  Note: This dictation was prepared with Dragon dictation along with smaller phrase technology. Any transcriptional errors that result from this process are unintentional.

## 2018-07-30 LAB — CBC
HEMATOCRIT: 35.2 % (ref 35.0–47.0)
HEMOGLOBIN: 12.1 g/dL (ref 12.0–16.0)
MCH: 32.9 pg (ref 26.0–34.0)
MCHC: 34.3 g/dL (ref 32.0–36.0)
MCV: 96.1 fL (ref 80.0–100.0)
Platelets: 166 10*3/uL (ref 150–440)
RBC: 3.67 MIL/uL — ABNORMAL LOW (ref 3.80–5.20)
RDW: 15.1 % — AB (ref 11.5–14.5)
WBC: 17.5 10*3/uL — ABNORMAL HIGH (ref 3.6–11.0)

## 2018-07-30 LAB — BASIC METABOLIC PANEL
Anion gap: 5 (ref 5–15)
BUN: 16 mg/dL (ref 6–20)
CALCIUM: 8.7 mg/dL — AB (ref 8.9–10.3)
CHLORIDE: 112 mmol/L — AB (ref 98–111)
CO2: 23 mmol/L (ref 22–32)
CREATININE: 0.51 mg/dL (ref 0.44–1.00)
GFR calc Af Amer: >60 mL/min (ref >60)
GFR calc non Af Amer: >60 mL/min (ref >60)
GLUCOSE: 164 mg/dL — AB (ref 70–99)
Potassium: 3.6 mmol/L (ref 3.5–5.1)
Sodium: 140 mmol/L (ref 135–145)

## 2018-07-30 MED ORDER — LEVOFLOXACIN 500 MG PO TABS: 500.0000 mg | ORAL_TABLET | Freq: Every day | ORAL | 0 refills | Status: AC

## 2018-07-30 MED ORDER — METHYLPREDNISOLONE SODIUM SUCC 40 MG IJ SOLR
40.0000 mg | Freq: Two times a day (BID) | INTRAMUSCULAR | Status: DC
  Administered 2018-07-30: 40 mg via INTRAVENOUS
  Filled 2018-07-30: qty 1

## 2018-07-30 MED ORDER — HEPARIN SOD (PORK) LOCK FLUSH 100 UNIT/ML IV SOLN: 500.0000 [IU] | Freq: Once | INTRAVENOUS | Status: DC

## 2018-07-30 MED ORDER — PREDNISONE 10 MG PO TABS: 40.0000 mg | ORAL_TABLET | Freq: Every day | ORAL | 0 refills | Status: AC

## 2018-07-30 NOTE — Care Management Note (Signed)
Case Management Note  Patient Details  Name: Krystal Lee MRN: [id number] Date of Birth: 1956-04-20  Subjective/Objective:       Admitted to [facility] with the diagnosis of COPD. Lives alone, but sister Guilford Shi [phone]) checks on her frequently. Sees her physician regularly. Takes care of all basic activities of daily living herself, doesn't drive. No home Health. No skilled nursing. Home oxygen per arranged per [facility] x 3 years. States she already has a nebulizer in the home. Uses a cane to aide in ambulation             Action/Plan:Dr. Juliene Pina has ordered a nebulizer, physical therapy, registered nurse, and aide in the home.  Declining these services at this time.   Expected Discharge Date:  07/30/18               Expected Discharge Plan:     In-House Referral:   yes  Discharge planning Services   yes  Post Acute Care Choice:    Choice offered to:     DME Arranged:    DME Agency:     HH Arranged:    HH Agency:     Status of Service:     If discussed at [employer], dates discussed:    Additional Comments:  Gwenette Greet, RN MSN CCM Care Management [phone] 07/30/2018, 12:58 PM

## 2018-07-30 NOTE — Progress Notes (Signed)
Family Meeting Note  Advance Directive:no  Today a meeting took place with the Patient.    The following clinical team members were present during this meeting:MD  The following were discussed:Patient's diagnosis ongoing tobacco dependence with COPD: , Patient's progosis: > 12 months and Goals for treatment: DNR  Additional follow-up to be provided: Patient would like to be DNR.  DNR order written.  Chaplain consultation for advanced directives  Time spent during discussion: 17 minutes  Makarios Madlock, MD

## 2018-07-30 NOTE — Care Management Obs Status (Deleted)
MEDICARE OBSERVATION STATUS NOTIFICATION   Patient Details  Name: Krystal Lee MRN: [id number] Date of Birth: [birth year]   Medicare Observation Status Notification Given:   yes, explained to son Gery Pray at the bedside.    Gwenette Greet, RN 07/30/2018, 9:29 AM

## 2018-07-30 NOTE — Progress Notes (Signed)
Pt d/ced home.  She ambulated around the nurse's station 2x w/out O2 and maintained O2 sats at 90-91%.  She refused home health.  Already uses O2 at home.  She is currently a smoker.  Tried to reinforce teaching that she needs to quit - disease will progress and probably get worse  with  each exacerbation.  Will review smoking cessation clinic.  Nursing student removed IV - will review d/c instructions.  Pt is being transported home by [facility].

## 2018-07-30 NOTE — Discharge Summary (Signed)
[employer] - [place] at [facility]   PATIENT NAME: Krystal Lee    MR#:  [id number]  DATE OF BIRTH:  Mar 04, 1956  DATE OF ADMISSION:  07/28/2018 ADMITTING PHYSICIAN: Altamese Dilling, MD  DATE OF DISCHARGE: 07/30/2018  PRIMARY CARE PHYSICIAN: Armando Gang, FNP    ADMISSION DIAGNOSIS:  COPD exacerbation (HCC) [J44.1]  DISCHARGE DIAGNOSIS:  Principal Problem:   COPD with acute exacerbation (HCC) Active Problems:   COPD exacerbation (HCC)   SECONDARY DIAGNOSIS:   Past Medical History:  Diagnosis Date  . Anxiety   . COPD (chronic obstructive pulmonary disease) (HCC)   . Depression   . GERD (gastroesophageal reflux disease)   . Hepatitis C virus   . Hypertension   . Post-menopausal     HOSPITAL COURSE:   62 year old female with chronic hypoxic respiratory failure on 2 L of oxygen who presents to the hospital with shortness of breath  1.  Chronic hypoxic respiratory failure with acute exacerbation of COPD and acute bronchitis: Patient symptoms have improved.  She has no wheezing on examination at discharge.  She is transition to oral steroids and Levaquin.  She will continue with nebulizer and inhalers. She wears 2 L oxygen chronically at home.  2.  Essential hypertension: Continue metoprolol  3.  Hyperlipidemia: Continue statin  4.  GERD: Continue PPI  5. Tobacco dependence: Patient is encouraged to quit smoking. Counseling was provided for 4 minutes.   DISCHARGE CONDITIONS AND DIET:   Stable Regular diet  CONSULTS OBTAINED:    DRUG ALLERGIES:  No Known Allergies  DISCHARGE MEDICATIONS:   Allergies as of 07/30/2018   No Known Allergies     Medication List    STOP taking these medications   azithromycin 250 MG tablet Commonly known as:  ZITHROMAX   budesonide 0.25 MG/2ML nebulizer solution Commonly known as:  PULMICORT   traMADol 50 MG tablet Commonly known as:  ULTRAM     TAKE these medications   alendronate 70 MG  tablet Commonly known as:  FOSAMAX Take 70 mg by mouth once a week.   ALPRAZolam 0.5 MG tablet Commonly known as:  XANAX Take 0.5 mg by mouth 4 (four) times daily as needed.   CRESTOR 40 MG tablet Generic drug:  rosuvastatin Take 40 mg by mouth daily.   cyclobenzaprine 10 MG tablet Commonly known as:  FLEXERIL Take 1 tablet by mouth daily as needed.   guaiFENesin-dextromethorphan 100-10 MG/5ML syrup Commonly known as:  ROBITUSSIN DM Take 5 mLs by mouth every 4 (four) hours as needed for cough.   ipratropium-albuterol 0.5-2.5 (3) MG/3ML Soln Commonly known as:  DUONEB Take 3 mLs by nebulization every 6 (six) hours as needed.   levofloxacin 500 MG tablet Commonly known as:  LEVAQUIN Take 1 tablet (500 mg total) by mouth daily for 4 days.   medroxyPROGESTERone 2.5 MG tablet Commonly known as:  PROVERA Only take 5 days at the end of the month.   metoprolol succinate 50 MG 24 hr tablet Commonly known as:  TOPROL-XL Take 50 mg by mouth daily.   pantoprazole 40 MG tablet Commonly known as:  PROTONIX Take 40 mg by mouth daily.   predniSONE 10 MG tablet Commonly known as:  DELTASONE Take 4 tablets (40 mg total) by mouth daily with breakfast for 4 days.   PREMARIN 0.3 MG tablet Generic drug:  estrogens (conjugated) Take 0.3 mg by mouth daily.   PROAIR HFA 108 (90 Base) MCG/ACT inhaler Generic drug:  albuterol Inhale 1-2  puffs into the lungs every 6 (six) hours as needed.   QUEtiapine 100 MG tablet Commonly known as:  SEROQUEL Take 100 mg by mouth at bedtime.   SYMBICORT 160-4.5 MCG/ACT inhaler Generic drug:  budesonide-formoterol Inhale 2 puffs into the lungs 2 (two) times daily.   tiotropium 18 MCG inhalation capsule Commonly known as:  SPIRIVA Place 1 capsule (18 mcg total) into inhaler and inhale daily.            Durable Medical Equipment  (From admission, onward)         Start     Ordered   07/30/18 1001  DME Oxygen  Once    Comments:  portable   Question Answer Comment  Mode or (Route) Nasal cannula   Liters per Minute 2   Frequency Continuous (stationary and portable oxygen unit needed)   Oxygen delivery system Gas      07/30/18 1000   07/30/18 1001  For home use only DME Nebulizer/meds  Once    Question:  Patient needs a nebulizer to treat with the following condition  Answer:  COPD (chronic obstructive pulmonary disease) (HCC)   07/30/18 1001            Today   CHIEF COMPLAINT:  Doing better this am wants to quit smoking but does not want nicotine patch   VITAL SIGNS:  Blood pressure (!) 158/78, pulse 79, temperature 97.6 F (36.4 C), temperature source Oral, resp. rate 19, height 4\' 11"  (1.499 m), weight 62.3 kg, SpO2 95 %.   REVIEW OF SYSTEMS:  Review of Systems  Constitutional: Negative.  Negative for chills, fever and malaise/fatigue.  HENT: Negative.  Negative for ear discharge, ear pain, hearing loss, nosebleeds and sore throat.   Eyes: Negative.  Negative for blurred vision and pain.  Respiratory: Positive for cough. Negative for hemoptysis, shortness of breath and wheezing.   Cardiovascular: Negative.  Negative for chest pain, palpitations and leg swelling.  Gastrointestinal: Negative.  Negative for abdominal pain, blood in stool, diarrhea, nausea and vomiting.  Genitourinary: Negative.  Negative for dysuria.  Musculoskeletal: Negative.  Negative for back pain.  Skin: Negative.   Neurological: Negative for dizziness, tremors, speech change, focal weakness, seizures and headaches.  Endo/Heme/Allergies: Negative.  Does not bruise/bleed easily.  Psychiatric/Behavioral: Negative.  Negative for depression, hallucinations and suicidal ideas.     PHYSICAL EXAMINATION:  GENERAL:  62 y.o.-year-old patient lying in the bed with no acute distress.  NECK:  Supple, no jugular venous distention. No thyroid enlargement, no tenderness.  LUNGS: Normal breath sounds bilaterally, no wheezing, rales,rhonchi  No use  of accessory muscles of respiration.  CARDIOVASCULAR: S1, S2 normal. No murmurs, rubs, or gallops.  ABDOMEN: Soft, non-tender, non-distended. Bowel sounds present. No organomegaly or mass.  EXTREMITIES: No pedal edema, cyanosis, or clubbing.  PSYCHIATRIC: The patient is alert and oriented x 3.  SKIN: No obvious rash, lesion, or ulcer.   DATA REVIEW:   CBC Recent Labs  Lab 07/30/18 0356  WBC 17.5*  HGB 12.1  HCT 35.2  PLT 166    Chemistries  Recent Labs  Lab 07/30/18 0356  NA 140  K 3.6  CL 112*  CO2 23  GLUCOSE 164*  BUN 16  CREATININE 0.51  CALCIUM 8.7*    Cardiac Enzymes Recent Labs  Lab 07/28/18 1909  TROPONINI <0.03    Microbiology Results  @MICRORSLT48 @  RADIOLOGY:  Dg Chest Port 1 View  Result Date: 07/28/2018 CLINICAL DATA:  Shortness of breath EXAM:  PORTABLE CHEST 1 VIEW COMPARISON:  07/09/2018 FINDINGS: Linear atelectasis at the bases. No focal consolidation or effusion. Stable cardiomediastinal silhouette with aortic atherosclerosis. No pneumothorax. IMPRESSION: No active disease.  Mild bibasilar atelectasis. Electronically Signed   By: Jasmine Pang M.D.   On: 07/28/2018 16:59      Allergies as of 07/30/2018   No Known Allergies     Medication List    STOP taking these medications   azithromycin 250 MG tablet Commonly known as:  ZITHROMAX   budesonide 0.25 MG/2ML nebulizer solution Commonly known as:  PULMICORT   traMADol 50 MG tablet Commonly known as:  ULTRAM     TAKE these medications   alendronate 70 MG tablet Commonly known as:  FOSAMAX Take 70 mg by mouth once a week.   ALPRAZolam 0.5 MG tablet Commonly known as:  XANAX Take 0.5 mg by mouth 4 (four) times daily as needed.   CRESTOR 40 MG tablet Generic drug:  rosuvastatin Take 40 mg by mouth daily.   cyclobenzaprine 10 MG tablet Commonly known as:  FLEXERIL Take 1 tablet by mouth daily as needed.   guaiFENesin-dextromethorphan 100-10 MG/5ML syrup Commonly known as:   ROBITUSSIN DM Take 5 mLs by mouth every 4 (four) hours as needed for cough.   ipratropium-albuterol 0.5-2.5 (3) MG/3ML Soln Commonly known as:  DUONEB Take 3 mLs by nebulization every 6 (six) hours as needed.   levofloxacin 500 MG tablet Commonly known as:  LEVAQUIN Take 1 tablet (500 mg total) by mouth daily for 4 days.   medroxyPROGESTERone 2.5 MG tablet Commonly known as:  PROVERA Only take 5 days at the end of the month.   metoprolol succinate 50 MG 24 hr tablet Commonly known as:  TOPROL-XL Take 50 mg by mouth daily.   pantoprazole 40 MG tablet Commonly known as:  PROTONIX Take 40 mg by mouth daily.   predniSONE 10 MG tablet Commonly known as:  DELTASONE Take 4 tablets (40 mg total) by mouth daily with breakfast for 4 days.   PREMARIN 0.3 MG tablet Generic drug:  estrogens (conjugated) Take 0.3 mg by mouth daily.   PROAIR HFA 108 (90 Base) MCG/ACT inhaler Generic drug:  albuterol Inhale 1-2 puffs into the lungs every 6 (six) hours as needed.   QUEtiapine 100 MG tablet Commonly known as:  SEROQUEL Take 100 mg by mouth at bedtime.   SYMBICORT 160-4.5 MCG/ACT inhaler Generic drug:  budesonide-formoterol Inhale 2 puffs into the lungs 2 (two) times daily.   tiotropium 18 MCG inhalation capsule Commonly known as:  SPIRIVA Place 1 capsule (18 mcg total) into inhaler and inhale daily.            Durable Medical Equipment  (From admission, onward)         Start     Ordered   07/30/18 1001  DME Oxygen  Once    Comments:  portable  Question Answer Comment  Mode or (Route) Nasal cannula   Liters per Minute 2   Frequency Continuous (stationary and portable oxygen unit needed)   Oxygen delivery system Gas      07/30/18 1000   07/30/18 1001  For home use only DME Nebulizer/meds  Once    Question:  Patient needs a nebulizer to treat with the following condition  Answer:  COPD (chronic obstructive pulmonary disease) (HCC)   07/30/18 1001            Management plans discussed with the patient and she is in agreement. Stable  for discharge home with [facility]  Patient should follow up with pcp  CODE STATUS:     Code Status Orders  (From admission, onward)         Start     Ordered   07/30/18 0957  Do not attempt resuscitation (DNR)  Continuous    Question Answer Comment  In the event of cardiac or respiratory ARREST Do not call a "code blue"   In the event of cardiac or respiratory ARREST Do not perform Intubation, CPR, defibrillation or ACLS   In the event of cardiac or respiratory ARREST Use medication by any route, position, wound care, and other measures to relive pain and suffering. May use oxygen, suction and manual treatment of airway obstruction as needed for comfort.      07/30/18 0956        Code Status History    Date Active Date Inactive Code Status Order ID Comments User Context   07/28/2018 1959 07/30/2018 0956 Partial Code [id number]  Altamese Dilling, MD Inpatient   09/16/2017 0834 09/18/2017 1830 Full Code [id number]  Altamese Dilling, MD Inpatient      TOTAL TIME TAKING CARE OF THIS PATIENT: 38 minutes.    Note: This dictation was prepared with Dragon dictation along with smaller phrase technology. Any transcriptional errors that result from this process are unintentional.  Adilynne Fitzwater M.D on 07/30/2018 at 10:02 AM  Between 7am to 6pm - Pager - [phone] After 6pm go to [email or web address] - Social research officer, government  [facility]  Office  [phone]  CC: Primary care physician; Armando Gang, FNP

## 2018-07-30 NOTE — Progress Notes (Signed)
Chaplian responded to an OR for aAD. Pt was lying in a darken room. She was alert and oriented. Pt said she did not request and AD. She said her sister takes care of all that. Chaplain left a brochure for review. Pt did not care for anything else.    07/30/18 1100  Clinical Encounter Type  Visited With Patient  Visit Type Initial  Referral From Physician  Spiritual Encounters  Spiritual Needs Brochure

## 2018-10-08 ENCOUNTER — Encounter: Payer: Self-pay | Admitting: Emergency Medicine

## 2018-10-08 ENCOUNTER — Other Ambulatory Visit: Payer: Self-pay

## 2018-10-08 ENCOUNTER — Observation Stay
Admission: EM | Admit: 2018-10-08 | Discharge: 2018-10-09 | Disposition: A | Discharge status: Home / Self Care | Payer: Medicare HMO | Attending: Internal Medicine | Admitting: Internal Medicine

## 2018-10-08 ENCOUNTER — Emergency Department: Payer: Medicare HMO

## 2018-10-08 DIAGNOSIS — K219 Gastro-esophageal reflux disease without esophagitis: Secondary | ICD-10-CM | POA: Diagnosis not present

## 2018-10-08 DIAGNOSIS — Z800 Family history of malignant neoplasm of digestive organs: Secondary | ICD-10-CM | POA: Diagnosis not present

## 2018-10-08 DIAGNOSIS — I10 Essential (primary) hypertension: Secondary | ICD-10-CM | POA: Insufficient documentation

## 2018-10-08 DIAGNOSIS — Z793 Long term (current) use of hormonal contraceptives: Secondary | ICD-10-CM | POA: Diagnosis not present

## 2018-10-08 DIAGNOSIS — Z803 Family history of malignant neoplasm of breast: Secondary | ICD-10-CM | POA: Insufficient documentation

## 2018-10-08 DIAGNOSIS — Z9841 Cataract extraction status, right eye: Secondary | ICD-10-CM | POA: Diagnosis not present

## 2018-10-08 DIAGNOSIS — F419 Anxiety disorder, unspecified: Secondary | ICD-10-CM | POA: Diagnosis not present

## 2018-10-08 DIAGNOSIS — Z79899 Other long term (current) drug therapy: Secondary | ICD-10-CM | POA: Insufficient documentation

## 2018-10-08 DIAGNOSIS — J441 Chronic obstructive pulmonary disease with (acute) exacerbation: Secondary | ICD-10-CM | POA: Insufficient documentation

## 2018-10-08 DIAGNOSIS — R0902 Hypoxemia: Secondary | ICD-10-CM

## 2018-10-08 DIAGNOSIS — F17210 Nicotine dependence, cigarettes, uncomplicated: Secondary | ICD-10-CM | POA: Insufficient documentation

## 2018-10-08 DIAGNOSIS — Z8619 Personal history of other infectious and parasitic diseases: Secondary | ICD-10-CM | POA: Insufficient documentation

## 2018-10-08 DIAGNOSIS — Z8249 Family history of ischemic heart disease and other diseases of the circulatory system: Secondary | ICD-10-CM | POA: Diagnosis not present

## 2018-10-08 DIAGNOSIS — Z7951 Long term (current) use of inhaled steroids: Secondary | ICD-10-CM | POA: Diagnosis not present

## 2018-10-08 DIAGNOSIS — Z823 Family history of stroke: Secondary | ICD-10-CM | POA: Insufficient documentation

## 2018-10-08 DIAGNOSIS — F329 Major depressive disorder, single episode, unspecified: Secondary | ICD-10-CM | POA: Diagnosis not present

## 2018-10-08 DIAGNOSIS — Z791 Long term (current) use of non-steroidal anti-inflammatories (NSAID): Secondary | ICD-10-CM | POA: Insufficient documentation

## 2018-10-08 DIAGNOSIS — J9621 Acute and chronic respiratory failure with hypoxia: Principal | ICD-10-CM | POA: Insufficient documentation

## 2018-10-08 LAB — CBC
HCT: 40.5 % (ref 36.0–46.0)
HEMOGLOBIN: 12.6 g/dL (ref 12.0–15.0)
MCH: 32.1 pg (ref 26.0–34.0)
MCHC: 31.1 g/dL (ref 30.0–36.0)
MCV: 103.3 fL — ABNORMAL HIGH (ref 80.0–100.0)
Platelets: 196 10*3/uL (ref 150–400)
RBC: 3.92 MIL/uL (ref 3.87–5.11)
RDW: 15.2 % (ref 11.5–15.5)
WBC: 6.5 10*3/uL (ref 4.0–10.5)
nRBC: 0 % (ref 0.0–0.2)

## 2018-10-08 LAB — BASIC METABOLIC PANEL
ANION GAP: 11 (ref 5–15)
BUN: 9 mg/dL (ref 6–20)
CHLORIDE: 108 mmol/L (ref 98–111)
CO2: 23 mmol/L (ref 22–32)
Calcium: 8.9 mg/dL (ref 8.9–10.3)
Creatinine, Ser: 0.41 mg/dL — ABNORMAL LOW (ref 0.44–1.00)
GFR calc Af Amer: >60 mL/min (ref >60)
GLUCOSE: 88 mg/dL (ref 70–99)
POTASSIUM: 4 mmol/L (ref 3.5–5.1)
Sodium: 142 mmol/L (ref 135–145)

## 2018-10-08 LAB — BLOOD GAS, VENOUS
ACID-BASE DEFICIT: 2.1 mmol/L — AB (ref 0.0–2.0)
BICARBONATE: 23.7 mmol/L (ref 20.0–28.0)
O2 SAT: 75.2 %
PATIENT TEMPERATURE: 37
PCO2 VEN: 44 mmHg (ref 44.0–60.0)
PO2 VEN: 43 mmHg (ref 32.0–45.0)
pH, Ven: 7.34 (ref 7.250–7.430)

## 2018-10-08 LAB — TROPONIN I: Troponin I: <0.03 ng/mL (ref <0.03)

## 2018-10-08 MED ORDER — ALBUTEROL (5 MG/ML) CONTINUOUS INHALATION SOLN
10.0000 mg/h | INHALATION_SOLUTION | Freq: Once | RESPIRATORY_TRACT | Status: AC
  Administered 2018-10-08: 10 mg/h via RESPIRATORY_TRACT
  Filled 2018-10-08: qty 20

## 2018-10-08 MED ORDER — IPRATROPIUM-ALBUTEROL 0.5-2.5 (3) MG/3ML IN SOLN
3.0000 mL | Freq: Once | RESPIRATORY_TRACT | Status: AC
  Administered 2018-10-08: 3 mL via RESPIRATORY_TRACT
  Filled 2018-10-08: qty 3

## 2018-10-08 MED ORDER — METHYLPREDNISOLONE SODIUM SUCC 125 MG IJ SOLR
125.0000 mg | Freq: Once | INTRAMUSCULAR | Status: AC
  Administered 2018-10-08: 125 mg via INTRAVENOUS
  Filled 2018-10-08: qty 2

## 2018-10-08 NOTE — ED Provider Notes (Addendum)
[facility] Emergency Department Provider Note  ____________________________________________  Time seen: Approximately 2:07 PM  I have reviewed the triage vital signs and the nursing notes.   HISTORY  Chief Complaint Shortness of Breath    HPI Krystal Lee is a 62 y.o. female a history of COPD who wears 2 L of O2 at night presenting for shortness of breath and cough.  The patient reports that for the past 3 days, she has been having cough productive of yellow sputum; at baseline she always has a cough but does not usually produce sputum.  She has been feeling increasingly short of breath.  Upon arrival, EMS noted the patient to be hypoxic on room air to 87% and she does not usually wear oxygen during the day.  The patient denies any congestion or rhinorrhea, sore throat, nausea vomiting or diarrhea.  No lower extremity swelling or calf pain.  Past Medical History:  Diagnosis Date  . Anxiety   . COPD (chronic obstructive pulmonary disease) (HCC)   . Depression   . GERD (gastroesophageal reflux disease)   . Hepatitis C virus   . Hypertension   . Post-menopausal     Patient Active Problem List   Diagnosis Date Noted  . COPD exacerbation (HCC) 07/28/2018  . Acute on chronic respiratory failure with hypoxia and hypercapnia (HCC) 09/16/2017  . COPD with acute exacerbation (HCC) 09/16/2017    Past Surgical History:  Procedure Laterality Date  . CATARACT EXTRACTION  2010   right     Current Outpatient Rx  . Order #: [id number] Class: Historical Med  . Order #: [id number] Class: Historical Med  . Order #: [id number] Class: Historical Med  . Order #: [id number] Class: Historical Med  . Order #: [id number] Class: Historical Med  . Order #: [id number] Class: Historical Med  . Order #: [id number] Class: Historical Med  . Order #: [id number] Class: Historical Med  . Order #: [id number] Class: Historical Med  . Order #: [id number] Class: Historical Med  . Order #:  [id number] Class: Historical Med  . Order #: [id number] Class: Historical Med    Allergies Patient has no known allergies.  Family History  Problem Relation Age of Onset  . Breast cancer Paternal Grandmother   . Bone cancer Paternal Grandmother   . Pancreatic cancer Mother   . Heart disease Father   . Heart disease Paternal Uncle   . Stroke Paternal Grandfather     Social History Social History   Tobacco Use  . Smoking status: Current Every Day Smoker    Packs/day: 1.00    Years: 43.00    Pack years: 43.00    Types: Cigarettes  . Smokeless tobacco: Never Used  Substance Use Topics  . Alcohol use: Yes    Alcohol/week: 1.0 standard drinks    Types: 1 Cans of beer per week  . Drug use: No    Review of Systems Constitutional: No fever/chills.  Lightheadedness or syncope. Eyes: No visual changes. ENT: No sore throat. No congestion or rhinorrhea.  No ear pain. Cardiovascular: Negative chest pain. Denies palpitations. Respiratory: Positive shortness of breath.  Positive productive cough. Gastrointestinal: No abdominal pain.  No nausea, no vomiting.  No diarrhea.  No constipation. Genitourinary: Negative for dysuria. Musculoskeletal: Negative for back pain. Skin: Negative for rash. Neurological: Negative for headaches. No focal numbness, tingling or weakness.     ____________________________________________   PHYSICAL EXAM:  VITAL SIGNS: ED Triage Vitals  Enc Vitals Group     BP 10/08/18 1357 130/78     Pulse Rate  10/08/18 1357 93     Resp 10/08/18 1357 (!) 24     Temp 10/08/18 1357 97.7 F (36.5 C)     Temp Source 10/08/18 1357 Oral     SpO2 10/08/18 1357 91 %     Weight 10/08/18 1359 143 lb (64.9 kg)     Height 10/08/18 1359 4\' 11"  (1.499 m)     Head Circumference --      Peak Flow --      Pain Score 10/08/18 1358 10     Pain Loc --      Pain Edu? --      Excl. in GC? --     Constitutional: Alert and oriented.  Answers questions appropriately.  Patient  is uncomfortable and having difficulty breathing. Eyes: Conjunctivae are normal.  EOMI. No scleral icterus. Head: Atraumatic. Nose: No congestion/rhinnorhea. Mouth/Throat: Mucous membranes are moist.  Neck: No stridor.  Supple.  Positive JVD.  No meningismus. Cardiovascular: Normal rate, regular rhythm. No murmurs, rubs or gallops.  Respiratory: Patient is in respiratory distress with tachypnea and O2 sats in the high 80s on room air.  She has diffuse expiratory greater than inspiratory wheezing in all the lung fields.  She has accessory muscle use with retractions.  No rales or rhonchi.  She is able to speak in 1-2 word sentences. Gastrointestinal: Obese.  Soft, nontender and nondistended.  No guarding or rebound.  No peritoneal signs. Musculoskeletal: No LE edema. No ttp in the calves or palpable cords.  Negative Homan's sign. Neurologic:  A&Ox3.  Speech is clear.  Face and smile are symmetric.  EOMI.  Moves all extremities well. Skin:  Skin is warm, dry and intact. No rash noted. Psychiatric: Mood and affect are normal.   ____________________________________________   LABS (all labs ordered are listed, but only abnormal results are displayed)  Labs Reviewed  BASIC METABOLIC PANEL - Abnormal; Notable for the following components:      Result Value   Creatinine, Ser 0.41 (*)    All other components within normal limits  CBC - Abnormal; Notable for the following components:   MCV 103.3 (*)    All other components within normal limits  BLOOD GAS, VENOUS - Abnormal; Notable for the following components:   Acid-base deficit 2.1 (*)    All other components within normal limits  CULTURE, BLOOD (ROUTINE X 2)  CULTURE, BLOOD (ROUTINE X 2)  TROPONIN I   ____________________________________________  EKG  ED ECG REPORT I, Anne-Caroline Sharma Covert, the attending physician, personally viewed and interpreted this ECG.   Date: 10/08/2018  EKG Time: 1357  Rate: 92  Rhythm: normal sinus  rhythm  Axis: normal  Intervals:none  ST&T Change: No STEMI  ____________________________________________  RADIOLOGY  Dg Chest Portable 1 View  Result Date: 10/08/2018 CLINICAL DATA:  Cough, shortness of breath EXAM: PORTABLE CHEST 1 VIEW COMPARISON:  07/28/2018 FINDINGS: There is no focal parenchymal opacity. There is no pleural effusion or pneumothorax. The heart and mediastinal contours are unremarkable. The osseous structures are unremarkable. IMPRESSION: No active disease. Electronically Signed   By: Elige Ko   On: 10/08/2018 14:44    ____________________________________________   PROCEDURES  Procedure(s) performed: None  Procedures  Critical Care performed: Yes, see critical care note(s) ____________________________________________   INITIAL IMPRESSION / ASSESSMENT AND PLAN / ED COURSE  Pertinent labs & imaging results that were available during my care of the patient were reviewed by me and considered in my medical decision making (see chart for details).  62 y.o. female with a history of COPD presenting with several days of progressively worsening shortness of breath, wheezing and increased oxygen requirement.  Overall, the patient is not in extremis and is able to maintain reassuring oxygen saturations on 2 L nasal cannula.  However I am concerned about COPD exacerbation.  A continuous nebulizer treatment has been ordered.  An infectious cause for her symptoms is less likely; I will hold off on antibiotics until I am able to see her chest x-ray imaging and the remainder of her laboratory studies as well as continuing to monitor her clinical course.  Cardiac cause for her symptoms is much less likely.  Her EKG does not show any evidence of ischemia and a troponin is pending.  I do not suspect PE.  Anticipate admission.  ----------------------------------------- 5:37 PM on 10/08/2018 -----------------------------------------  Patient is continued to be  hemodynamically stable.  She has received a continuous nebulizer treatment and is resting comfortably.  She does continue to have some wheezing, but this has significantly improved.  Her work-up in the emergency department has been reassuring.  Her chest x-ray does not show any acute cardiopulmonary process and her white blood cell count is normal, again consistent with a COPD exacerbation without overlying infection.  She has reassuring electrolytes, a normal VBG, and a troponin which is negative.  We will give her 1 additional DuoNeb, and if she is able to maintain oxygen saturations that are normal without supplemental oxygen and ambulate without significant distress, we will plan to discharge her home.  If she is unable to do these things, she will come in for continued treatment of her COPD.  ----------------------------------------- 7:43 PM on 10/08/2018 -----------------------------------------  The patient has received multiple DuoNeb's, and a continuous neb, and continues to have hypoxia even with just sitting while awake.  Her gas is reassuring, but she will require admission for new oxygen requirement and continued pulmonary treatment.  CRITICAL CARE Performed by: Rockne MenghiniAnne-Caroline Oran Dillenburg   Total critical care time: 40 minutes  Critical care time was exclusive of separately billable procedures and treating other patients.  Critical care was necessary to treat or prevent imminent or life-threatening deterioration.  Critical care was time spent personally by me on the following activities: development of treatment plan with patient and/or surrogate as well as nursing, discussions with consultants, evaluation of patient's response to treatment, examination of patient, obtaining history from patient or surrogate, ordering and performing treatments and interventions, ordering and review of laboratory studies, ordering and review of radiographic studies, pulse oximetry and re-evaluation of  patient's condition.   ____________________________________________  FINAL CLINICAL IMPRESSION(S) / ED DIAGNOSES  Final diagnoses:  COPD exacerbation (HCC)  Hypoxia         NEW MEDICATIONS STARTED DURING THIS VISIT:  New Prescriptions   No medications on file      Rockne MenghiniNorman, Anne-Caroline, MD 10/08/18 1507    Rockne MenghiniNorman, Anne-Caroline, MD 10/08/18 1738    Rockne MenghiniNorman, Anne-Caroline, MD 10/08/18 1944

## 2018-10-08 NOTE — ED Notes (Signed)
Pt trialed on RA. O2 saturation decreased to 86%. Pt placed back on 2L via [place]

## 2018-10-08 NOTE — ED Notes (Signed)
After several attempts at IV, Carollee HerterShannon able to establish a 24g in left thumb. IV did not draw blood. A butterfly produced a small amount of blood for labs, but not enough for blood cultures. Pt seems to think she will have easier blood draws "after I have a few hours to warm up."

## 2018-10-08 NOTE — H&P (Signed)
[facility] - [place] at [facility]   PATIENT NAME: Krystal Lee    MR#:  [id number]  DATE OF BIRTH:  [birth year]  DATE OF ADMISSION:  10/08/2018  PRIMARY CARE PHYSICIAN: Armando Gang, FNP   REQUESTING/REFERRING PHYSICIAN:   CHIEF COMPLAINT:   Chief Complaint  Patient presents with  . Shortness of Breath    HISTORY OF PRESENT ILLNESS: Krystal Lee  is a 62 y.o. female with a known history of anxiety disorder, hypertension, COPD on 2 L oxygen per nasal cannula at night. She presented to emergency room for shortness of breath, wheezing and productive cough with yellow sputum, going on for the past 3 days, gradually getting worse.  No reports of fever, chills, sore throat, body aches, nausea, vomiting, bleeding. Per EMS report, initial oxygen saturation on room air was 87%.  Blood test done emergency room, including CBC and CMP are grossly unremarkable. Chest x-ray is negative for acute cardiopulmonary abnormalities. She is admitted for further evaluation and treatment.  PAST MEDICAL HISTORY:   Past Medical History:  Diagnosis Date  . Anxiety   . COPD (chronic obstructive pulmonary disease) (HCC)   . Depression   . GERD (gastroesophageal reflux disease)   . Hepatitis C virus   . Hypertension   . Post-menopausal     PAST SURGICAL HISTORY:  Past Surgical History:  Procedure Laterality Date  . CATARACT EXTRACTION  2010   right     SOCIAL HISTORY:  Social History   Tobacco Use  . Smoking status: Current Every Day Smoker    Packs/day: 1.00    Years: 43.00    Pack years: 43.00    Types: Cigarettes  . Smokeless tobacco: Never Used  Substance Use Topics  . Alcohol use: Yes    Alcohol/week: 1.0 standard drinks    Types: 1 Cans of beer per week    FAMILY HISTORY:  Family History  Problem Relation Age of Onset  . Breast cancer Paternal Grandmother   . Bone cancer Paternal Grandmother   . Pancreatic cancer Mother   . Heart disease  Father   . Heart disease Paternal Uncle   . Stroke Paternal Grandfather     DRUG ALLERGIES: No Known Allergies  REVIEW OF SYSTEMS:   CONSTITUTIONAL: No fever, fatigue or weakness.  EYES: No changes in vision.  EARS, NOSE, AND THROAT: No tinnitus or ear pain.  RESPIRATORY: Defer productive cough, shortness of breath, wheezing; no hemoptysis.  CARDIOVASCULAR: No chest pain, orthopnea, edema.  GASTROINTESTINAL: No nausea, vomiting, diarrhea or abdominal pain.  GENITOURINARY: No dysuria, hematuria.  ENDOCRINE: No polyuria, nocturia. HEMATOLOGY: No bleeding. SKIN: No rash or lesion. MUSCULOSKELETAL: No joint pain at this time.   NEUROLOGIC: No focal weakness.  PSYCHIATRY: Has active history of anxiety disorder.   MEDICATIONS AT HOME:  Prior to Admission medications   Medication Sig Start Date End Date Taking? Authorizing Provider  albuterol (PROVENTIL HFA;VENTOLIN HFA) 108 (90 Base) MCG/ACT inhaler Inhale 1-2 puffs into the lungs every 6 (six) hours as needed for wheezing or shortness of breath.   Yes [provider]  alendronate (FOSAMAX) 70 MG tablet Take 70 mg by mouth every Saturday.    Yes [provider]  ALPRAZolam Prudy Feeler) 0.5 MG tablet Take 0.5 mg by mouth 4 (four) times daily.    Yes [provider]  budesonide-formoterol (SYMBICORT) 160-4.5 MCG/ACT inhaler Inhale 2 puffs into the lungs 2 (two) times daily.   Yes [provider]  cyclobenzaprine (FLEXERIL) 10  MG tablet Take 10 mg by mouth daily as needed for muscle spasms.    Yes [provider]  estrogens, conjugated, (PREMARIN) 0.3 MG tablet Take 0.3 mg by mouth daily. Take daily for 21 days then do not take for 7 days.   Yes [provider]  ibuprofen (ADVIL,MOTRIN) 800 MG tablet Take 800 mg by mouth every 8 (eight) hours as needed for mild pain or moderate pain.    Yes [provider]  medroxyPROGESTERone (PROVERA) 2.5 MG tablet Take 2.5 mg by mouth See admin  instructions. Daily the last 5 days of each month   Yes [provider]  metoprolol succinate (TOPROL-XL) 50 MG 24 hr tablet Take 50 mg by mouth daily.  08/22/17  Yes [provider]  pantoprazole (PROTONIX) 40 MG tablet Take 40 mg by mouth daily. 08/17/17  Yes [provider]  QUEtiapine (SEROQUEL) 100 MG tablet Take 100 mg by mouth at bedtime.  08/17/17  Yes [provider]  rosuvastatin (CRESTOR) 40 MG tablet Take 40 mg by mouth at bedtime.   Yes [provider]      PHYSICAL EXAMINATION:   VITAL SIGNS: Blood pressure (!) 142/88, pulse 96, temperature 97.7 F (36.5 C), temperature source Oral, resp. rate 12, height 4\' 11"  (1.499 m), weight 64.9 kg, SpO2 92 %.  GENERAL:  62 y.o.-year-old patient lying in the bed with mild respiratory distress.  EYES: Pupils equal, round, reactive to light and accommodation. No scleral icterus. Extraocular muscles intact.  HEENT: Head atraumatic, normocephalic. Oropharynx and nasopharynx clear.  NECK:  Supple, no jugular venous distention. No thyroid enlargement, no tenderness.  LUNGS: Reduced breath sounds and scattered wheezing bilaterally. No use of accessory muscles of respiration.  CARDIOVASCULAR: S1, S2 normal. No S3/S4.  ABDOMEN: Soft, nontender, nondistended. Bowel sounds present. No organomegaly or mass.  EXTREMITIES: No pedal edema, cyanosis, or clubbing.  NEUROLOGIC: No focal weakness. PSYCHIATRIC: The patient is alert and oriented x 3.  SKIN: No obvious rash, lesion, or ulcer.   LABORATORY PANEL:   CBC Recent Labs  Lab 10/08/18 1638  WBC 6.5  HGB 12.6  HCT 40.5  PLT 196  MCV 103.3*  MCH 32.1  MCHC 31.1  RDW 15.2   ------------------------------------------------------------------------------------------------------------------  Chemistries  Recent Labs  Lab 10/08/18 1638  NA 142  K 4.0  CL 108  CO2 23  GLUCOSE 88  BUN 9  CREATININE 0.41*  CALCIUM 8.9    ------------------------------------------------------------------------------------------------------------------ estimated creatinine clearance is 61.3 mL/min (A) (by C-G formula based on SCr of 0.41 mg/dL (L)). ------------------------------------------------------------------------------------------------------------------ No results for input(s): TSH, T4TOTAL, T3FREE, THYROIDAB in the last 72 hours.  Invalid input(s): FREET3   Coagulation profile No results for input(s): INR, PROTIME in the last 168 hours. ------------------------------------------------------------------------------------------------------------------- No results for input(s): DDIMER in the last 72 hours. -------------------------------------------------------------------------------------------------------------------  Cardiac Enzymes Recent Labs  Lab 10/08/18 1638  TROPONINI <0.03   ------------------------------------------------------------------------------------------------------------------ Invalid input(s): POCBNP  ---------------------------------------------------------------------------------------------------------------  Urinalysis    Component Value Date/Time   COLORURINE STRAW (A) 03/17/2018 0055   APPEARANCEUR CLEAR (A) 03/17/2018 0055   APPEARANCEUR Clear 10/29/2013 2004   LABSPEC 1.004 (L) 03/17/2018 0055   LABSPEC 1.004 10/29/2013 2004   PHURINE 6.0 03/17/2018 0055   GLUCOSEU NEGATIVE 03/17/2018 0055   GLUCOSEU Negative 10/29/2013 2004   HGBUR NEGATIVE 03/17/2018 0055   BILIRUBINUR NEGATIVE 03/17/2018 0055   BILIRUBINUR Negative 10/29/2013 2004   KETONESUR NEGATIVE 03/17/2018 0055   PROTEINUR NEGATIVE 03/17/2018 0055   UROBILINOGEN 1.0 11/07/2013 1053  NITRITE NEGATIVE 03/17/2018 0055   LEUKOCYTESUR NEGATIVE 03/17/2018 0055   LEUKOCYTESUR Negative 10/29/2013 2004     RADIOLOGY: Dg Chest Portable 1 View  Result Date: 10/08/2018 CLINICAL DATA:  Cough, shortness of breath  EXAM: PORTABLE CHEST 1 VIEW COMPARISON:  07/28/2018 FINDINGS: There is no focal parenchymal opacity. There is no pleural effusion or pneumothorax. The heart and mediastinal contours are unremarkable. The osseous structures are unremarkable. IMPRESSION: No active disease. Electronically Signed   By: Elige KoHetal  Patel   On: 10/08/2018 14:44    EKG: Orders placed or performed during the hospital encounter of 10/08/18  . EKG 12-Lead  . EKG 12-Lead  . ED EKG  . ED EKG    IMPRESSION AND PLAN:  1.  Acute on chronic respiratory failure with hypoxia, secondary to COPD exacerbation.  We will start treatment with oxygen, antibiotics, nebulizer and steroids.  Continue to monitor clinically closely. 2.  Acute COPD exacerbation likely secondary to acute bronchitis.  See treatment as above under #1. 3.  Hypertension, stable, resume home medications. 4.  Anxiety disorder, stable, resume home medications.  All the records are reviewed and case discussed with ED provider. Management plans discussed with the patient, family and they are in agreement.  CODE STATUS: DNR Code Status History    Date Active Date Inactive Code Status Order ID Comments User Context   07/30/2018 0956 07/30/2018 1851 DNR [id number]  Adrian SaranMody, Sital, MD Inpatient   07/28/2018 1959 07/30/2018 0956 Partial Code [id number]  Altamese DillingVachhani, Vaibhavkumar, MD Inpatient   09/16/2017 0834 09/18/2017 1830 Full Code [id number]  Altamese DillingVachhani, Vaibhavkumar, MD Inpatient    Questions for Most Recent Historical Code Status (Order [id number])    Question Answer Comment   In the event of cardiac or respiratory ARREST Do not call a "code blue"    In the event of cardiac or respiratory ARREST Do not perform Intubation, CPR, defibrillation or ACLS    In the event of cardiac or respiratory ARREST Use medication by any route, position, wound care, and other measures to relive pain and suffering. May use oxygen, suction and manual treatment of airway obstruction as needed for  comfort.         Advance Directive Documentation     Most Recent Value  Type of Advance Directive  Healthcare Power of Attorney, Living will  Pre-existing out of facility DNR order (yellow form or pink MOST form)  -  "MOST" Form in Place?  -       TOTAL TIME TAKING CARE OF THIS PATIENT: 50 minutes.    Cammy CopaAngela Lorence Nagengast M.D on 10/08/2018 at 9:34 PM  Between 7am to 6pm - Pager - [phone]  After 6pm go to [email or web address] - password [email or web address] [email or web address]  [facility] [place] [facility]  [phone]  CC: Primary care physician; Armando GangLindley, Cheryl P, FNP

## 2018-10-08 NOTE — ED Triage Notes (Signed)
Pt from home via [facility], with complaints of SOB. She does have COPD. Her SOB is getting worse over the last few days. EMS gave her 1 duo neb treatment and 1 albuterol treatment and it brought her up to 100% She wears 2L [place] at night.

## 2018-10-09 ENCOUNTER — Other Ambulatory Visit: Payer: Self-pay

## 2018-10-09 LAB — BASIC METABOLIC PANEL
Anion gap: 4 — ABNORMAL LOW (ref 5–15)
BUN: 15 mg/dL (ref 6–20)
CO2: 27 mmol/L (ref 22–32)
CREATININE: 0.41 mg/dL — AB (ref 0.44–1.00)
Calcium: 8.6 mg/dL — ABNORMAL LOW (ref 8.9–10.3)
Chloride: 111 mmol/L (ref 98–111)
GFR calc non Af Amer: >60 mL/min (ref >60)
Glucose, Bld: 164 mg/dL — ABNORMAL HIGH (ref 70–99)
Potassium: 3.6 mmol/L (ref 3.5–5.1)
SODIUM: 142 mmol/L (ref 135–145)

## 2018-10-09 LAB — CBC
HCT: 39.8 % (ref 36.0–46.0)
Hemoglobin: 12.7 g/dL (ref 12.0–15.0)
MCH: 32.1 pg (ref 26.0–34.0)
MCHC: 31.9 g/dL (ref 30.0–36.0)
MCV: 100.5 fL — ABNORMAL HIGH (ref 80.0–100.0)
NRBC: 0 % (ref 0.0–0.2)
Platelets: 215 10*3/uL (ref 150–400)
RBC: 3.96 MIL/uL (ref 3.87–5.11)
RDW: 14.7 % (ref 11.5–15.5)
WBC: 4.9 10*3/uL (ref 4.0–10.5)

## 2018-10-09 LAB — GLUCOSE, CAPILLARY: Glucose-Capillary: 143 mg/dL — ABNORMAL HIGH (ref 70–99)

## 2018-10-09 MED ORDER — PREDNISONE 10 MG PO TABS: ORAL_TABLET | ORAL | 0 refills | Status: DC

## 2018-10-09 MED ORDER — MEDROXYPROGESTERONE ACETATE 2.5 MG PO TABS: 2.5000 mg | ORAL_TABLET | ORAL | Status: DC

## 2018-10-09 MED ORDER — METOPROLOL SUCCINATE ER 50 MG PO TB24
50.0000 mg | ORAL_TABLET | Freq: Every day | ORAL | Status: DC
  Administered 2018-10-09: 11:00:00 50 mg via ORAL
  Filled 2018-10-09: qty 1

## 2018-10-09 MED ORDER — HEPARIN SODIUM (PORCINE) 5000 UNIT/ML IJ SOLN: 5000.0000 [IU] | Freq: Three times a day (TID) | INTRAMUSCULAR | Status: DC

## 2018-10-09 MED ORDER — HYDROCODONE-ACETAMINOPHEN 5-325 MG PO TABS: 1.0000 | ORAL_TABLET | ORAL | Status: DC | PRN

## 2018-10-09 MED ORDER — ONDANSETRON HCL 4 MG/2ML IJ SOLN: 4.0000 mg | Freq: Four times a day (QID) | INTRAMUSCULAR | Status: DC | PRN

## 2018-10-09 MED ORDER — BISACODYL 5 MG PO TBEC: 5.0000 mg | DELAYED_RELEASE_TABLET | Freq: Every day | ORAL | Status: DC | PRN

## 2018-10-09 MED ORDER — QUETIAPINE FUMARATE 25 MG PO TABS
100.0000 mg | ORAL_TABLET | Freq: Every day | ORAL | Status: DC
  Administered 2018-10-09: 03:00:00 100 mg via ORAL
  Filled 2018-10-09: qty 4

## 2018-10-09 MED ORDER — TRAZODONE HCL 50 MG PO TABS: 25.0000 mg | ORAL_TABLET | Freq: Every evening | ORAL | Status: DC | PRN

## 2018-10-09 MED ORDER — DOCUSATE SODIUM 100 MG PO CAPS
100.0000 mg | ORAL_CAPSULE | Freq: Two times a day (BID) | ORAL | Status: DC
  Administered 2018-10-09: 11:00:00 100 mg via ORAL
  Filled 2018-10-09: qty 1

## 2018-10-09 MED ORDER — METHYLPREDNISOLONE SODIUM SUCC 125 MG IJ SOLR
80.0000 mg | Freq: Two times a day (BID) | INTRAMUSCULAR | Status: DC
  Administered 2018-10-09: 03:00:00 80 mg via INTRAVENOUS
  Filled 2018-10-09: qty 2

## 2018-10-09 MED ORDER — AZITHROMYCIN 250 MG PO TABS: 250.0000 mg | ORAL_TABLET | Freq: Every day | ORAL | 0 refills | Status: DC

## 2018-10-09 MED ORDER — ALENDRONATE SODIUM 70 MG PO TABS: 70.0000 mg | ORAL_TABLET | ORAL | Status: DC

## 2018-10-09 MED ORDER — SODIUM CHLORIDE 0.9 % IV SOLN
1.0000 g | INTRAVENOUS | Status: DC
  Administered 2018-10-09: 1 g via INTRAVENOUS
  Filled 2018-10-09 (×2): qty 10

## 2018-10-09 MED ORDER — ROSUVASTATIN CALCIUM 20 MG PO TABS
40.0000 mg | ORAL_TABLET | Freq: Every day | ORAL | Status: DC
  Administered 2018-10-09: 03:00:00 40 mg via ORAL
  Filled 2018-10-09 (×2): qty 2

## 2018-10-09 MED ORDER — PREDNISONE 50 MG PO TABS: 50.0000 mg | ORAL_TABLET | Freq: Every day | ORAL | Status: DC

## 2018-10-09 MED ORDER — ALPRAZOLAM 0.5 MG PO TABS
0.5000 mg | ORAL_TABLET | Freq: Four times a day (QID) | ORAL | Status: DC
  Administered 2018-10-09 (×3): 0.5 mg via ORAL
  Filled 2018-10-09 (×3): qty 1

## 2018-10-09 MED ORDER — PANTOPRAZOLE SODIUM 40 MG PO TBEC
40.0000 mg | DELAYED_RELEASE_TABLET | Freq: Every day | ORAL | Status: DC
  Administered 2018-10-09: 40 mg via ORAL
  Filled 2018-10-09: qty 1

## 2018-10-09 MED ORDER — ACETAMINOPHEN 325 MG PO TABS: 650.0000 mg | ORAL_TABLET | Freq: Four times a day (QID) | ORAL | Status: DC | PRN

## 2018-10-09 MED ORDER — FLUTICASONE FUROATE-VILANTEROL 200-25 MCG/INH IN AEPB
1.0000 | INHALATION_SPRAY | Freq: Every day | RESPIRATORY_TRACT | Status: DC
  Administered 2018-10-09: 14:00:00 1 via RESPIRATORY_TRACT
  Filled 2018-10-09 (×2): qty 28

## 2018-10-09 MED ORDER — ONDANSETRON HCL 4 MG PO TABS: 4.0000 mg | ORAL_TABLET | Freq: Four times a day (QID) | ORAL | Status: DC | PRN

## 2018-10-09 MED ORDER — SODIUM CHLORIDE 0.9 % IV SOLN
500.0000 mg | INTRAVENOUS | Status: DC
  Filled 2018-10-09 (×2): qty 500

## 2018-10-09 MED ORDER — IPRATROPIUM-ALBUTEROL 0.5-2.5 (3) MG/3ML IN SOLN
3.0000 mL | Freq: Four times a day (QID) | RESPIRATORY_TRACT | Status: DC
  Administered 2018-10-09 (×2): 3 mL via RESPIRATORY_TRACT
  Filled 2018-10-09 (×2): qty 3

## 2018-10-09 MED ORDER — AZITHROMYCIN 500 MG PO TABS
250.0000 mg | ORAL_TABLET | Freq: Every day | ORAL | Status: DC
  Administered 2018-10-09: 250 mg via ORAL
  Filled 2018-10-09: qty 1

## 2018-10-09 MED ORDER — ACETAMINOPHEN 650 MG RE SUPP: 650.0000 mg | Freq: Four times a day (QID) | RECTAL | Status: DC | PRN

## 2018-10-09 NOTE — Care Management CC44 (Signed)
Condition Code 44 Documentation Completed  Patient Details  Name: Krystal Lee MRN: [id number] Date of Birth: 28-Jul-1956   Condition Code 44 given:  Yes Patient signature on Condition Code 44 notice:  Yes Documentation of 2 MD's agreement:  Yes Code 44 added to claim:  Yes    Gwenette GreetBrenda S Krystal Ditullio, RN 10/09/2018, 1:22 PM

## 2018-10-09 NOTE — Care Management Obs Status (Signed)
MEDICARE OBSERVATION STATUS NOTIFICATION   Patient Details  Name: Krystal Lee MRN: [id number] Date of Birth: [birth year]   Medicare Observation Status Notification Given:  Yes    Gwenette GreetBrenda S Elleni Mozingo, RN 10/09/2018, 1:21 PM

## 2018-10-09 NOTE — Discharge Instructions (Signed)
Use your oxygen, nebulizer, inhalers as before.  Smoking cessation highly encouraged.

## 2018-10-09 NOTE — Progress Notes (Signed)
Pt discharged home at this time via w/c w/o c/o /picked up by her sister. Instructions discussed with pt. Sl d/cd.  meds / diet activity and f/u discussed/ verbalizes understanding.

## 2018-10-09 NOTE — Plan of Care (Signed)
  Problem: Education: Goal: Knowledge of disease or condition will improve Outcome: Progressing Goal: Knowledge of the prescribed therapeutic regimen will improve Outcome: Progressing Goal: Individualized Educational Video(s) Outcome: Progressing   Problem: Activity: Goal: Ability to tolerate increased activity will improve Outcome: Progressing Goal: Will verbalize the importance of balancing activity with adequate rest periods Outcome: Progressing   Problem: Respiratory: Goal: Ability to maintain a clear airway will improve Outcome: Progressing Goal: Levels of oxygenation will improve Outcome: Progressing Goal: Ability to maintain adequate ventilation will improve Outcome: Progressing   Problem: Education: Goal: Knowledge of disease or condition will improve Outcome: Progressing Goal: Knowledge of the prescribed therapeutic regimen will improve Outcome: Progressing Goal: Individualized Educational Video(s) Outcome: Progressing   Problem: Activity: Goal: Ability to tolerate increased activity will improve Outcome: Progressing Goal: Will verbalize the importance of balancing activity with adequate rest periods Outcome: Progressing   Problem: Respiratory: Goal: Ability to maintain a clear airway will improve Outcome: Progressing Goal: Levels of oxygenation will improve Outcome: Progressing Goal: Ability to maintain adequate ventilation will improve Outcome: Progressing   Problem: Education: Goal: Knowledge of General Education information will improve Description Including pain rating scale, medication(s)/side effects and non-pharmacologic comfort measures Outcome: Progressing   Problem: Pain Managment: Goal: General experience of comfort will improve Outcome: Progressing   Problem: Safety: Goal: Ability to remain free from injury will improve Outcome: Progressing   Problem: Skin Integrity: Goal: Risk for impaired skin integrity will decrease Outcome:  Progressing

## 2018-10-09 NOTE — Discharge Summary (Signed)
[facility] - [place] at [facility]   PATIENT NAME: Krystal Lee    MR#:  [id number]  DATE OF BIRTH:  July 04, 1956  DATE OF ADMISSION:  10/08/2018 ADMITTING PHYSICIAN: Cammy Copa, MD  DATE OF DISCHARGE: 10/09/2018  PRIMARY CARE PHYSICIAN: Armando Gang, FNP    ADMISSION DIAGNOSIS:  Hypoxia [R09.02] COPD exacerbation (HCC) [J44.1]  DISCHARGE DIAGNOSIS:  acute on chronic COPD exacerbation ongoing tobacco abuse  SECONDARY DIAGNOSIS:   Past Medical History:  Diagnosis Date  . Anxiety   . COPD (chronic obstructive pulmonary disease) (HCC)   . Depression   . GERD (gastroesophageal reflux disease)   . Hepatitis C virus   . Hypertension   . Post-menopausal     HOSPITAL COURSE:  Krystal Lee  is a 62 y.o. female with a known history of anxiety disorder, hypertension, COPD on 2 L oxygen per nasal cannula at night. She presented to emergency room for shortness of breath, wheezing and productive cough with yellow sputum, going on for the past 3 days, gradually getting worse.  No reports of fever, chills, sore throat, body aches, nausea, vomiting, bleeding.  1.  Acute on chronic respiratory failure with hypoxia, secondary to COPD exacerbation.  - cont oxygen (uses chronically at home0, antibiotics, nebulizer and steroids.  Continue to monitor clinically closely. -feels better today. She is got very scattered wheezing. -Patient advised on smoking cessation. -Chest x-ray negative for pneumonia. Zithromax empirically for four more days.  2.  Acute COPD exacerbation likely secondary to acute bronchitis.  See treatment as above under #1.  3.  Hypertension, stable, resume home medications.  4.  Anxiety disorder, stable, resume home medications.  Patient is okay to go home. She will call her friend/sister in the afternoon to get her ride  CONSULTS OBTAINED:    DRUG ALLERGIES:  No Known Allergies  DISCHARGE MEDICATIONS:   Allergies as of 10/09/2018    No Known Allergies     Medication List    TAKE these medications   albuterol 108 (90 Base) MCG/ACT inhaler Commonly known as:  PROVENTIL HFA;VENTOLIN HFA Inhale 1-2 puffs into the lungs every 6 (six) hours as needed for wheezing or shortness of breath.   alendronate 70 MG tablet Commonly known as:  FOSAMAX Take 70 mg by mouth every Saturday.   ALPRAZolam 0.5 MG tablet Commonly known as:  XANAX Take 0.5 mg by mouth 4 (four) times daily.   azithromycin 250 MG tablet Commonly known as:  ZITHROMAX Take 1 tablet (250 mg total) by mouth daily.   budesonide-formoterol 160-4.5 MCG/ACT inhaler Commonly known as:  SYMBICORT Inhale 2 puffs into the lungs 2 (two) times daily.   cyclobenzaprine 10 MG tablet Commonly known as:  FLEXERIL Take 10 mg by mouth daily as needed for muscle spasms.   estrogens (conjugated) 0.3 MG tablet Commonly known as:  PREMARIN Take 0.3 mg by mouth daily. Take daily for 21 days then do not take for 7 days.   ibuprofen 800 MG tablet Commonly known as:  ADVIL,MOTRIN Take 800 mg by mouth every 8 (eight) hours as needed for mild pain or moderate pain.   medroxyPROGESTERone 2.5 MG tablet Commonly known as:  PROVERA Take 2.5 mg by mouth See admin instructions. Daily the last 5 days of each month   metoprolol succinate 50 MG 24 hr tablet Commonly known as:  TOPROL-XL Take 50 mg by mouth daily.   pantoprazole 40 MG tablet Commonly known as:  PROTONIX Take 40 mg by mouth  daily.   predniSONE 10 MG tablet Commonly known as:  DELTASONE Take 50 mg daily. Taper by 10 mg daily then stop. Start taking on:  10/10/2018   QUEtiapine 100 MG tablet Commonly known as:  SEROQUEL Take 100 mg by mouth at bedtime.   rosuvastatin 40 MG tablet Commonly known as:  CRESTOR Take 40 mg by mouth at bedtime.       If you experience worsening of your admission symptoms, develop shortness of breath, life threatening emergency, suicidal or homicidal thoughts you must  seek medical attention immediately by calling 911 or calling your MD immediately  if symptoms less severe.  You Must read complete instructions/literature along with all the possible adverse reactions/side effects for all the Medicines you take and that have been prescribed to you. Take any new Medicines after you have completely understood and accept all the possible adverse reactions/side effects.   Please note  You were cared for by a hospitalist during your hospital stay. If you have any questions about your discharge medications or the care you received while you were in the hospital after you are discharged, you can call the unit and asked to speak with the hospitalist on call if the hospitalist that took care of you is not available. Once you are discharged, your primary care physician will handle any further medical issues. Please note that NO REFILLS for any discharge medications will be authorized once you are discharged, as it is imperative that you return to your primary care physician (or establish a relationship with a primary care physician if you do not have one) for your aftercare needs so that they can reassess your need for medications and monitor your lab values. Today   SUBJECTIVE   Overall feels better. Some cough. Continues to smoke at home. Wheezing improved.  VITAL SIGNS:  Blood pressure 96/73, pulse 97, temperature (!) 97.3 F (36.3 C), temperature source Oral, resp. rate 17, height 4\' 11"  (1.499 m), weight 64.9 kg, SpO2 96 %.  I/O:    Intake/Output Summary (Last 24 hours) at 10/09/2018 1123 Last data filed at 10/09/2018 1020 Gross per 24 hour  Intake 609.91 ml  Output -  Net 609.91 ml    PHYSICAL EXAMINATION:  GENERAL:  62 y.o.-year-old patient lying in the bed with no acute distress.  EYES: Pupils equal, round, reactive to light and accommodation. No scleral icterus. Extraocular muscles intact.  HEENT: Head atraumatic, normocephalic. Oropharynx and  nasopharynx clear.  NECK:  Supple, no jugular venous distention. No thyroid enlargement, no tenderness.  LUNGS: Normal breath sounds bilaterally, scatterd wheezing, rales,rhonchi or crepitation. No use of accessory muscles of respiration.  CARDIOVASCULAR: S1, S2 normal. No murmurs, rubs, or gallops.  ABDOMEN: Soft, non-tender, non-distended. Bowel sounds present. No organomegaly or mass.  EXTREMITIES: No pedal edema, cyanosis, or clubbing.  NEUROLOGIC: Cranial nerves II through XII are intact. Muscle strength 5/5 in all extremities. Sensation intact. Gait not checked.  PSYCHIATRIC:  patient is alert and oriented x 3.  SKIN: No obvious rash, lesion, or ulcer.   DATA REVIEW:   CBC  Recent Labs  Lab 10/09/18 0552  WBC 4.9  HGB 12.7  HCT 39.8  PLT 215    Chemistries  Recent Labs  Lab 10/09/18 0552  NA 142  K 3.6  CL 111  CO2 27  GLUCOSE 164*  BUN 15  CREATININE 0.41*  CALCIUM 8.6*    Microbiology Results   No results found for this or any previous visit (from the  past 240 hour(s)).  RADIOLOGY:  Dg Chest Portable 1 View  Result Date: 10/08/2018 CLINICAL DATA:  Cough, shortness of breath EXAM: PORTABLE CHEST 1 VIEW COMPARISON:  07/28/2018 FINDINGS: There is no focal parenchymal opacity. There is no pleural effusion or pneumothorax. The heart and mediastinal contours are unremarkable. The osseous structures are unremarkable. IMPRESSION: No active disease. Electronically Signed   By: Elige Ko   On: 10/08/2018 14:44     Management plans discussed with the patient, family and they are in agreement.  CODE STATUS:     Code Status Orders  (From admission, onward)         Start     Ordered   10/09/18 0241  Do not attempt resuscitation (DNR)  Continuous    Question Answer Comment  In the event of cardiac or respiratory ARREST Do not call a "code blue"   In the event of cardiac or respiratory ARREST Do not perform Intubation, CPR, defibrillation or ACLS   In the  event of cardiac or respiratory ARREST Use medication by any route, position, wound care, and other measures to relive pain and suffering. May use oxygen, suction and manual treatment of airway obstruction as needed for comfort.      10/09/18 0240        Code Status History    Date Active Date Inactive Code Status Order ID Comments User Context   07/30/2018 0956 07/30/2018 1851 DNR [id number]  Adrian Saran, MD Inpatient   07/28/2018 1959 07/30/2018 0956 Partial Code [id number]  Altamese Dilling, MD Inpatient   09/16/2017 0834 09/18/2017 1830 Full Code [id number]  Altamese Dilling, MD Inpatient    Advance Directive Documentation     Most Recent Value  Type of Advance Directive  Healthcare Power of Attorney, Living will  Pre-existing out of facility DNR order (yellow form or pink MOST form)  -  "MOST" Form in Place?  -      TOTAL TIME TAKING CARE OF THIS PATIENT: 40* minutes.    Enedina Finner M.D on 10/09/2018 at 11:23 AM  Between 7am to 6pm - Pager - [phone] After 6pm go to [email or web address] - Social research officer, government  [facility]  Office  [phone]  CC: Primary care physician; Armando Gang, FNP

## 2018-10-10 LAB — HIV ANTIBODY (ROUTINE TESTING W REFLEX): HIV Screen 4th Generation wRfx: NONREACTIVE

## 2018-10-14 LAB — CULTURE, BLOOD (ROUTINE X 2)
CULTURE: NO GROWTH
Culture: NO GROWTH
Special Requests: ADEQUATE
Special Requests: ADEQUATE

## 2018-11-21 ENCOUNTER — Emergency Department: Payer: Medicare HMO

## 2018-11-21 ENCOUNTER — Other Ambulatory Visit: Payer: Self-pay

## 2018-11-21 ENCOUNTER — Emergency Department
Admission: EM | Admit: 2018-11-21 | Discharge: 2018-11-21 | Disposition: A | Discharge status: Home / Self Care | Payer: Medicare HMO | Attending: Emergency Medicine | Admitting: Emergency Medicine

## 2018-11-21 ENCOUNTER — Encounter: Payer: Self-pay | Admitting: Emergency Medicine

## 2018-11-21 DIAGNOSIS — R0602 Shortness of breath: Secondary | ICD-10-CM | POA: Diagnosis present

## 2018-11-21 DIAGNOSIS — J9611 Chronic respiratory failure with hypoxia: Secondary | ICD-10-CM | POA: Diagnosis not present

## 2018-11-21 DIAGNOSIS — I10 Essential (primary) hypertension: Secondary | ICD-10-CM | POA: Insufficient documentation

## 2018-11-21 DIAGNOSIS — F17210 Nicotine dependence, cigarettes, uncomplicated: Secondary | ICD-10-CM | POA: Insufficient documentation

## 2018-11-21 DIAGNOSIS — Z79899 Other long term (current) drug therapy: Secondary | ICD-10-CM | POA: Diagnosis not present

## 2018-11-21 DIAGNOSIS — J441 Chronic obstructive pulmonary disease with (acute) exacerbation: Secondary | ICD-10-CM | POA: Insufficient documentation

## 2018-11-21 DIAGNOSIS — J9612 Chronic respiratory failure with hypercapnia: Secondary | ICD-10-CM | POA: Diagnosis not present

## 2018-11-21 LAB — TROPONIN I: Troponin I: <0.03 ng/mL (ref <0.03)

## 2018-11-21 LAB — CBC WITH DIFFERENTIAL/PLATELET
Abs Immature Granulocytes: 0.03 10*3/uL (ref 0.00–0.07)
Basophils Absolute: 0 10*3/uL (ref 0.0–0.1)
Basophils Relative: 0 %
Eosinophils Absolute: 0.2 10*3/uL (ref 0.0–0.5)
Eosinophils Relative: 2 %
HCT: 41.3 % (ref 36.0–46.0)
HEMOGLOBIN: 13.6 g/dL (ref 12.0–15.0)
Immature Granulocytes: 0 %
LYMPHS PCT: 30 %
Lymphs Abs: 2.6 10*3/uL (ref 0.7–4.0)
MCH: 31.1 pg (ref 26.0–34.0)
MCHC: 32.9 g/dL (ref 30.0–36.0)
MCV: 94.3 fL (ref 80.0–100.0)
Monocytes Absolute: 0.9 10*3/uL (ref 0.1–1.0)
Monocytes Relative: 11 %
Neutro Abs: 4.8 10*3/uL (ref 1.7–7.7)
Neutrophils Relative %: 57 %
Platelets: 180 10*3/uL (ref 150–400)
RBC: 4.38 MIL/uL (ref 3.87–5.11)
RDW: 13.5 % (ref 11.5–15.5)
WBC: 8.5 10*3/uL (ref 4.0–10.5)
nRBC: 0 % (ref 0.0–0.2)

## 2018-11-21 LAB — BASIC METABOLIC PANEL
Anion gap: 8 (ref 5–15)
BUN: 15 mg/dL (ref 6–20)
CO2: 20 mmol/L — ABNORMAL LOW (ref 22–32)
Calcium: 8.7 mg/dL — ABNORMAL LOW (ref 8.9–10.3)
Chloride: 107 mmol/L (ref 98–111)
Creatinine, Ser: 0.55 mg/dL (ref 0.44–1.00)
GFR calc Af Amer: >60 mL/min (ref >60)
GFR calc non Af Amer: >60 mL/min (ref >60)
Glucose, Bld: 127 mg/dL — ABNORMAL HIGH (ref 70–99)
Potassium: 3.6 mmol/L (ref 3.5–5.1)
SODIUM: 135 mmol/L (ref 135–145)

## 2018-11-21 MED ORDER — ALBUTEROL SULFATE (2.5 MG/3ML) 0.083% IN NEBU: 5.0000 mg | INHALATION_SOLUTION | Freq: Once | RESPIRATORY_TRACT | Status: DC

## 2018-11-21 MED ORDER — PREDNISONE 10 MG (21) PO TBPK: ORAL_TABLET | ORAL | 0 refills | Status: DC

## 2018-11-21 MED ORDER — IPRATROPIUM-ALBUTEROL 0.5-2.5 (3) MG/3ML IN SOLN
3.0000 mL | Freq: Once | RESPIRATORY_TRACT | Status: AC
  Administered 2018-11-21: 3 mL via RESPIRATORY_TRACT
  Filled 2018-11-21: qty 9

## 2018-11-21 MED ORDER — IPRATROPIUM-ALBUTEROL 0.5-2.5 (3) MG/3ML IN SOLN
3.0000 mL | Freq: Once | RESPIRATORY_TRACT | Status: AC
  Administered 2018-11-21: 3 mL via RESPIRATORY_TRACT

## 2018-11-21 MED ORDER — METHYLPREDNISOLONE SODIUM SUCC 125 MG IJ SOLR
125.0000 mg | Freq: Once | INTRAMUSCULAR | Status: AC
  Administered 2018-11-21: 125 mg via INTRAVENOUS
  Filled 2018-11-21: qty 2

## 2018-11-21 NOTE — Discharge Instructions (Addendum)
Please seek medical attention for any high fevers, chest pain, shortness of breath, change in behavior, persistent vomiting, bloody stool or any other new or concerning symptoms.  

## 2018-11-21 NOTE — ED Notes (Addendum)
Pt ambulated down hall on 2L oxygen. O2 started at 92% and remained 92-94% while ambulating.  Pt back in bed, not wanting to wear oxygen at this time. 91% Room air.

## 2018-11-21 NOTE — ED Notes (Signed)
Patient transported to X-ray 

## 2018-11-21 NOTE — ED Triage Notes (Signed)
Pt from [facility] from home with complaints of SOB, She has had this for the several weeks, she just started Doxycycline on Monday and states that she is not any better. She does still smoke. She wears 2.5 liter at home of O2  PRN. EMS reports that she was 92% at home on her oxygen they gave her 2 breathing treatments on the way in. Pt has audible wheezing

## 2018-11-21 NOTE — ED Provider Notes (Signed)
[facility] Emergency Department Provider Note  ____________________________________________   I have reviewed the triage vital signs and the nursing notes.   HISTORY  Chief Complaint Shortness of Breath   History limited by: Not Limited   HPI Krystal Lee is a 63 y.o. female who presents to the emergency department today because of concerns for shortness of breath and difficulty with breathing.  Patient states that she has felt short of breath for about a year.  She does have a history of COPD and states that she has frequent exacerbations.  She has had 2 recently and is just finishing up a course of doxycycline prescribed by her primary care doctor.  Patient states before that prescription she had been given a prescription for steroids and Levaquin. Patient without fever.   Per medical record review patient has a history of COPD, with somewhat frequent hospitalizations and ER visits for breathing difficulty.   Past Medical History:  Diagnosis Date  . Anxiety   . COPD (chronic obstructive pulmonary disease) (HCC)   . Depression   . GERD (gastroesophageal reflux disease)   . Hepatitis C virus   . Hypertension   . Post-menopausal     Patient Active Problem List   Diagnosis Date Noted  . COPD exacerbation (HCC) 07/28/2018  . Acute on chronic respiratory failure with hypoxia and hypercapnia (HCC) 09/16/2017  . COPD with acute exacerbation (HCC) 09/16/2017    Past Surgical History:  Procedure Laterality Date  . CATARACT EXTRACTION  2010   right     Prior to Admission medications   Medication Sig Start Date End Date Taking? Authorizing Provider  albuterol (PROVENTIL HFA;VENTOLIN HFA) 108 (90 Base) MCG/ACT inhaler Inhale 1-2 puffs into the lungs every 6 (six) hours as needed for wheezing or shortness of breath.    [provider]  alendronate (FOSAMAX) 70 MG tablet Take 70 mg by mouth every Saturday.     [provider]   ALPRAZolam Prudy Feeler(XANAX) 0.5 MG tablet Take 0.5 mg by mouth 4 (four) times daily.     [provider]  azithromycin (ZITHROMAX) 250 MG tablet Take 1 tablet (250 mg total) by mouth daily. 10/09/18   Enedina FinnerPatel, Sona, MD  budesonide-formoterol [facility](SYMBICORT) 160-4.5 MCG/ACT inhaler Inhale 2 puffs into the lungs 2 (two) times daily.    [provider]  cyclobenzaprine (FLEXERIL) 10 MG tablet Take 10 mg by mouth daily as needed for muscle spasms.     [provider]  estrogens, conjugated, (PREMARIN) 0.3 MG tablet Take 0.3 mg by mouth daily. Take daily for 21 days then do not take for 7 days.    [provider]  ibuprofen (ADVIL,MOTRIN) 800 MG tablet Take 800 mg by mouth every 8 (eight) hours as needed for mild pain or moderate pain.     [provider]  medroxyPROGESTERone (PROVERA) 2.5 MG tablet Take 2.5 mg by mouth See admin instructions. Daily the last 5 days of each month    [provider]  metoprolol succinate (TOPROL-XL) 50 MG 24 hr tablet Take 50 mg by mouth daily.  08/22/17   [provider]  pantoprazole (PROTONIX) 40 MG tablet Take 40 mg by mouth daily. 08/17/17   [provider]  predniSONE (DELTASONE) 10 MG tablet Take 50 mg daily. Taper by 10 mg daily then stop. 10/10/18   Enedina FinnerPatel, Sona, MD  QUEtiapine (SEROQUEL) 100 MG tablet Take 100 mg by mouth at bedtime.  08/17/17   [provider]  rosuvastatin (  CRESTOR) 40 MG tablet Take 40 mg by mouth at bedtime.    [provider]    Allergies Patient has no known allergies.  Family History  Problem Relation Age of Onset  . Breast cancer Paternal Grandmother   . Bone cancer Paternal Grandmother   . Pancreatic cancer Mother   . Heart disease Father   . Heart disease Paternal Uncle   . Stroke Paternal Grandfather     Social History Social History   Tobacco Use  . Smoking status: Current Every Day Smoker    Packs/day: 1.00    Years: 43.00    Pack years: 43.00     Types: Cigarettes  . Smokeless tobacco: Never Used  Substance Use Topics  . Alcohol use: Yes    Alcohol/week: 1.0 standard drinks    Types: 1 Cans of beer per week  . Drug use: No    Review of Systems Constitutional: No fever/chills Eyes: No visual changes. ENT: No sore throat. Cardiovascular: Denies chest pain. Respiratory: Denies shortness of breath. Gastrointestinal: No abdominal pain.  No nausea, no vomiting.  No diarrhea.   Genitourinary: Negative for dysuria. Musculoskeletal: Negative for back pain. Skin: Negative for rash. Neurological: Negative for headaches, focal weakness or numbness.  ____________________________________________   PHYSICAL EXAM:  VITAL SIGNS: ED Triage Vitals [11/21/18 1642]  Enc Vitals Group     BP      Pulse Rate (!) 109     Resp (!) 24     Temp 98.7 F (37.1 C)     Temp Source Oral     SpO2 92 %     Weight      Height      Head Circumference      Peak Flow      Pain Score      Pain Loc      Pain Edu?      Excl. in GC?      Constitutional: Alert and oriented.  Eyes: Conjunctivae are normal.  ENT      Head: Normocephalic and atraumatic.      Nose: No congestion/rhinnorhea.      Mouth/Throat: Mucous membranes are moist.      Neck: No stridor. Hematological/Lymphatic/Immunilogical: No cervical lymphadenopathy. Cardiovascular: Normal rate, regular rhythm.  No murmurs, rubs, or gallops.  Respiratory: Slightly increased work of breathing. Diffuse expiratory wheezing.  Gastrointestinal: Soft and non tender. No rebound. No guarding.  Genitourinary: Deferred Musculoskeletal: Normal range of motion in all extremities. No lower extremity edema. Neurologic:  Normal speech and language. No gross focal neurologic deficits are appreciated.  Skin:  Skin is warm, dry and intact. No rash noted. Psychiatric: Mood and affect are normal. Speech and behavior are normal. Patient exhibits appropriate insight and  judgment.  ____________________________________________    LABS (pertinent positives/negatives)  Trop <0.03 CBC wbc 8.5, hgb 13.6, plt 180 BMP na 135, k 3.6, glu 127, cr 0.55  ____________________________________________   EKG  I, Phineas Semen, attending physician, personally viewed and interpreted this EKG  EKG Time: 1643 Rate: 110 Rhythm: sinus tachycardia Axis: normal Intervals: qtc 439 QRS: narrow, low voltage ST changes: no st elevation Impression: abnormal ekg  ____________________________________________    RADIOLOGY  CXR No acute abnormality  ____________________________________________   PROCEDURES  Procedures  ____________________________________________   INITIAL IMPRESSION / ASSESSMENT AND PLAN / ED COURSE  Pertinent labs & imaging results that were available during my care of the patient were reviewed by me and considered in my medical decision making (  see chart for details).   Patient presented to the emergency department today because of concerns for shortness of breath.  Differential includes COPD, CHF, pneumonia, pneumothorax, PE amongst other etiologies.  Patient does have a history of COPD.  She was given breathing treatments and steroids here.  She did feel better afterwards.  Patient did ambulate and stated she felt comfortable going home.  Will prescribe patient prescription for steroids.  ____________________________________________   FINAL CLINICAL IMPRESSION(S) / ED DIAGNOSES  Final diagnoses:  COPD exacerbation (HCC)  SOB (shortness of breath)     Note: This dictation was prepared with Dragon dictation. Any transcriptional errors that result from this process are unintentional     Phineas Semen, MD 11/21/18 Ernestina [place]

## 2018-12-15 IMAGING — DX DG CHEST 1V PORT
1 series · 1 of 1 positions shown · non-contrast
Comparison: Chest radiograph August 07, 2017

CLINICAL DATA: Shortness of breath for week, worsening tonight.
Hypoxia and wheezing. History of COPD.

EXAM:
PORTABLE CHEST 1 VIEW

[chest ap]
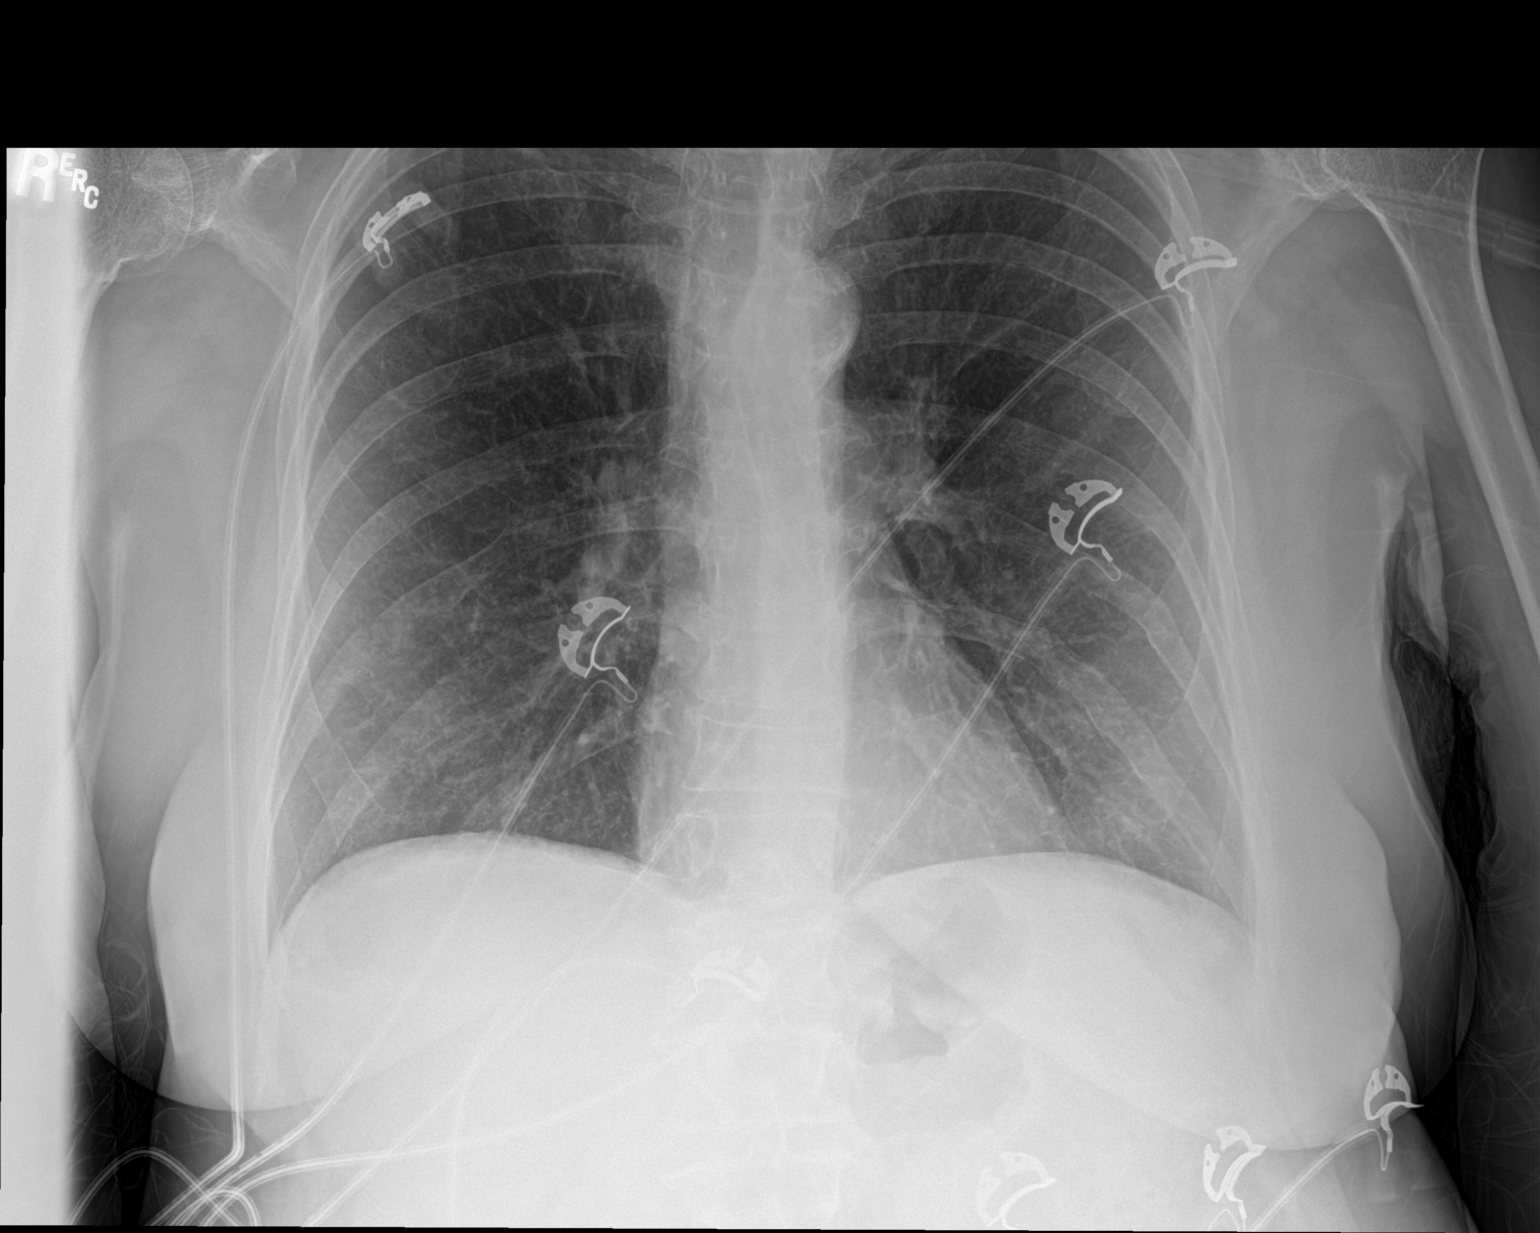

[1 of 1 positions shown; findings below may reference images not displayed]

FINDINGS: Cardiomediastinal silhouette is normal. Calcified aortic knob. No
pleural effusions or focal consolidations. Mild chronic interstitial
changes and increased lung volumes. Biapical pleural thickening.
Trachea projects midline and there is no pneumothorax. Soft tissue
planes and included osseous structures are non-suspicious. Old
bilateral rib fractures.
IMPRESSION: COPD, no acute cardiopulmonary process.

Aortic Atherosclerosis ([id number]).

## 2019-01-18 ENCOUNTER — Emergency Department: Payer: Medicare HMO

## 2019-01-18 ENCOUNTER — Emergency Department
Admission: EM | Admit: 2019-01-18 | Discharge: 2019-01-18 | Disposition: A | Discharge status: Home / Self Care | Payer: Medicare HMO | Attending: Emergency Medicine | Admitting: Emergency Medicine

## 2019-01-18 ENCOUNTER — Other Ambulatory Visit: Payer: Self-pay

## 2019-01-18 DIAGNOSIS — R05 Cough: Secondary | ICD-10-CM | POA: Insufficient documentation

## 2019-01-18 DIAGNOSIS — I10 Essential (primary) hypertension: Secondary | ICD-10-CM | POA: Diagnosis not present

## 2019-01-18 DIAGNOSIS — Z79899 Other long term (current) drug therapy: Secondary | ICD-10-CM | POA: Insufficient documentation

## 2019-01-18 DIAGNOSIS — J441 Chronic obstructive pulmonary disease with (acute) exacerbation: Secondary | ICD-10-CM

## 2019-01-18 DIAGNOSIS — R0602 Shortness of breath: Secondary | ICD-10-CM | POA: Diagnosis present

## 2019-01-18 DIAGNOSIS — F17210 Nicotine dependence, cigarettes, uncomplicated: Secondary | ICD-10-CM | POA: Diagnosis not present

## 2019-01-18 DIAGNOSIS — E876 Hypokalemia: Secondary | ICD-10-CM

## 2019-01-18 HISTORY — DX: Schizoaffective disorder, unspecified: F25.9

## 2019-01-18 HISTORY — DX: Bipolar disorder, unspecified: F31.9

## 2019-01-18 LAB — CBC
HCT: 42.6 % (ref 36.0–46.0)
Hemoglobin: 14.6 g/dL (ref 12.0–15.0)
MCH: 30.3 pg (ref 26.0–34.0)
MCHC: 34.3 g/dL (ref 30.0–36.0)
MCV: 88.4 fL (ref 80.0–100.0)
PLATELETS: 268 10*3/uL (ref 150–400)
RBC: 4.82 MIL/uL (ref 3.87–5.11)
RDW: 14.6 % (ref 11.5–15.5)
WBC: 12.2 10*3/uL — ABNORMAL HIGH (ref 4.0–10.5)
nRBC: 0 % (ref 0.0–0.2)

## 2019-01-18 LAB — BASIC METABOLIC PANEL
Anion gap: 16 — ABNORMAL HIGH (ref 5–15)
BUN: 7 mg/dL — ABNORMAL LOW (ref 8–23)
CO2: 19 mmol/L — ABNORMAL LOW (ref 22–32)
Calcium: 9.1 mg/dL (ref 8.9–10.3)
Chloride: 101 mmol/L (ref 98–111)
Creatinine, Ser: 0.62 mg/dL (ref 0.44–1.00)
GFR calc Af Amer: >60 mL/min (ref >60)
GFR calc non Af Amer: >60 mL/min (ref >60)
Glucose, Bld: 117 mg/dL — ABNORMAL HIGH (ref 70–99)
Potassium: 2.4 mmol/L — CL (ref 3.5–5.1)
Sodium: 136 mmol/L (ref 135–145)

## 2019-01-18 LAB — TROPONIN I: Troponin I: <0.03 ng/mL (ref <0.03)

## 2019-01-18 MED ORDER — PREDNISONE 20 MG PO TABS: 40.0000 mg | ORAL_TABLET | Freq: Every day | ORAL | 0 refills | Status: DC

## 2019-01-18 MED ORDER — SODIUM CHLORIDE 0.9 % IV BOLUS
1000.0000 mL | Freq: Once | INTRAVENOUS | Status: AC
  Administered 2019-01-18: 1000 mL via INTRAVENOUS

## 2019-01-18 MED ORDER — POTASSIUM CHLORIDE CRYS ER 10 MEQ PO TBCR: 10.0000 meq | EXTENDED_RELEASE_TABLET | Freq: Two times a day (BID) | ORAL | 0 refills | Status: DC

## 2019-01-18 MED ORDER — IPRATROPIUM-ALBUTEROL 0.5-2.5 (3) MG/3ML IN SOLN
3.0000 mL | Freq: Once | RESPIRATORY_TRACT | Status: AC
  Administered 2019-01-18: 3 mL via RESPIRATORY_TRACT

## 2019-01-18 MED ORDER — IPRATROPIUM-ALBUTEROL 0.5-2.5 (3) MG/3ML IN SOLN
3.0000 mL | Freq: Once | RESPIRATORY_TRACT | Status: AC
  Administered 2019-01-18: 3 mL via RESPIRATORY_TRACT
  Filled 2019-01-18: qty 6

## 2019-01-18 MED ORDER — METHYLPREDNISOLONE SODIUM SUCC 125 MG IJ SOLR
125.0000 mg | Freq: Once | INTRAMUSCULAR | Status: AC
  Administered 2019-01-18: 125 mg via INTRAVENOUS
  Filled 2019-01-18: qty 2

## 2019-01-18 MED ORDER — POTASSIUM CHLORIDE CRYS ER 20 MEQ PO TBCR
40.0000 meq | EXTENDED_RELEASE_TABLET | Freq: Once | ORAL | Status: AC
  Administered 2019-01-18: 40 meq via ORAL
  Filled 2019-01-18: qty 2

## 2019-01-18 MED ORDER — POTASSIUM CHLORIDE 10 MEQ/100ML IV SOLN
10.0000 meq | Freq: Once | INTRAVENOUS | Status: AC
  Administered 2019-01-18: 10 meq via INTRAVENOUS
  Filled 2019-01-18: qty 100

## 2019-01-18 NOTE — ED Provider Notes (Signed)
[facility] Emergency Department Provider Note  Time seen: 4:14 PM  I have reviewed the triage vital signs and the nursing notes.   HISTORY  Chief Complaint Shortness of Breath    HPI Krystal Lee is a 63 y.o. female with a past medical history anxiety, bipolar, COPD, gastric reflux, hypertension, presents to the emergency department for shortness of breath.  According to the patient over the past 3 to 4 days she has had progressively worsening shortness of breath, cough but no sputum production.  Denies any fever.  Denies any chest pain.  No abdominal pain.  Largely negative review of systems.   Past Medical History:  Diagnosis Date  . Anxiety   . Bipolar 1 disorder (HCC)   . COPD (chronic obstructive pulmonary disease) (HCC)   . Depression   . GERD (gastroesophageal reflux disease)   . Hepatitis C virus   . Hypertension   . Post-menopausal   . Schizoaffective disorder [facility])     Patient Active Problem List   Diagnosis Date Noted  . COPD exacerbation (HCC) 07/28/2018  . Acute on chronic respiratory failure with hypoxia and hypercapnia (HCC) 09/16/2017  . COPD with acute exacerbation (HCC) 09/16/2017    Past Surgical History:  Procedure Laterality Date  . CATARACT EXTRACTION  2010   right     Prior to Admission medications   Medication Sig Start Date End Date Taking? Authorizing Provider  albuterol (PROVENTIL HFA;VENTOLIN HFA) 108 (90 Base) MCG/ACT inhaler Inhale 1-2 puffs into the lungs every 6 (six) hours as needed for wheezing or shortness of breath.    [provider]  alendronate (FOSAMAX) 70 MG tablet Take 70 mg by mouth every Saturday.     [provider]  ALPRAZolam Prudy Feeler) 0.5 MG tablet Take 0.5 mg by mouth 4 (four) times daily.     [provider]  azithromycin (ZITHROMAX) 250 MG tablet Take 1 tablet (250 mg total) by mouth daily. 10/09/18   Enedina Finner, MD  budesonide-formoterol [facility]) 160-4.5  MCG/ACT inhaler Inhale 2 puffs into the lungs 2 (two) times daily.    [provider]  cyclobenzaprine (FLEXERIL) 10 MG tablet Take 10 mg by mouth daily as needed for muscle spasms.     [provider]  estrogens, conjugated, (PREMARIN) 0.3 MG tablet Take 0.3 mg by mouth daily. Take daily for 21 days then do not take for 7 days.    [provider]  gabapentin (NEURONTIN) 300 MG capsule Take 300 mg by mouth daily.    [provider]  ibuprofen (ADVIL,MOTRIN) 800 MG tablet Take 800 mg by mouth every 8 (eight) hours as needed for mild pain or moderate pain.     [provider]  medroxyPROGESTERone (PROVERA) 2.5 MG tablet Take 2.5 mg by mouth See admin instructions. Daily the last 5 days of each month    [provider]  metoprolol succinate (TOPROL-XL) 50 MG 24 hr tablet Take 50 mg by mouth daily.  08/22/17   [provider]  pantoprazole (PROTONIX) 40 MG tablet Take 40 mg by mouth daily. 08/17/17   [provider]  predniSONE (STERAPRED UNI-PAK 21 TAB) 10 MG (21) TBPK tablet Per packaging instructions 11/21/18   Phineas Semen, MD  QUEtiapine (SEROQUEL) 100 MG tablet Take 100 mg by mouth at bedtime.  08/17/17   [provider]  rosuvastatin (CRESTOR) 40 MG tablet Take 40 mg by mouth at bedtime.    [provider]    No  Known Allergies  Family History  Problem Relation Age of Onset  . Breast cancer Paternal Grandmother   . Bone cancer Paternal Grandmother   . Pancreatic cancer Mother   . Heart disease Father   . Heart disease Paternal Uncle   . Stroke Paternal Grandfather     Social History Social History   Tobacco Use  . Smoking status: Current Every Day Smoker    Packs/day: 1.00    Years: 43.00    Pack years: 43.00    Types: Cigarettes  . Smokeless tobacco: Never Used  Substance Use Topics  . Alcohol use: Not Currently    Alcohol/week: 1.0 standard drinks    Types: 1 Cans of beer per week  .  Drug use: No    Review of Systems Constitutional: Negative for fever. Cardiovascular: Negative for chest pain. Respiratory: Positive for shortness of breath.  Positive for cough but negative for sputum production Gastrointestinal: Negative for abdominal pain, vomiting Musculoskeletal: Negative for leg pain or swelling Skin: Negative for skin complaints  Neurological: Negative for headache All other ROS negative  ____________________________________________   PHYSICAL EXAM:  VITAL SIGNS: ED Triage Vitals [01/18/19 1455]  Enc Vitals Group     BP 123/65     Pulse Rate (!) 124     Resp 17     Temp 97.7 F (36.5 C)     Temp Source Oral     SpO2 100 %     Weight 143 lb (64.9 kg)     Height 4\' 11"  (1.499 m)     Head Circumference      Peak Flow      Pain Score 0     Pain Loc      Pain Edu?      Excl. in GC?    Constitutional: Alert and oriented. Well appearing and in no distress. Eyes: Normal exam ENT   Head: Normocephalic and atraumatic.   Mouth/Throat: Mucous membranes are moist. Cardiovascular: Normal rate, regular rhythm around 100 to 110 bpm. Respiratory: Normal respiratory effort without tachypnea nor retractions. Breath sounds are clear  Gastrointestinal: Soft and nontender. No distention.  Musculoskeletal: Nontender with normal range of motion in all extremities. No lower extremity tenderness or edema. Neurologic:  Normal speech and language. No gross focal neurologic deficits  Skin:  Skin is warm, dry and intact.  Psychiatric: Mood and affect are normal.   ____________________________________________    EKG  EKG viewed and interpreted by myself shows sinus tachycardia 123 bpm with a narrow QRS, normal axis, largely normal intervals with nonspecific ST changes.  ____________________________________________    RADIOLOGY  Chest x-ray negative for acute abnormality  ____________________________________________   INITIAL IMPRESSION / ASSESSMENT  AND PLAN / ED COURSE  Pertinent labs & imaging results that were available during my care of the patient were reviewed by me and considered in my medical decision making (see chart for details).  Patient presents to the emergency department for shortness of breath progressively worsening over the past 3 to 4 days.  Overall the patient appears well, no acute distress.  Denies any chest pain.  EKG shows tachycardia but nonspecific ST findings.  Patient's heart rate currently around 100 to 110 bpm.  We will dose Solu-Medrol, duo nebs, IV fluids and continue to closely monitor.  We will obtain a chest x-ray and labs.  Differential at this time would include COPD exacerbation, pneumonia, CHF, URI, pneumothorax, ACS.  Patient's labs are overall reassuring besides a mild elevation anion gap  consistent with dehydration.  Potassium was also low at 2.4.  We will dose IV and p.o. potassium.  Patient states she feels well, pulse rate currently in the 90s.  Chest x-ray negative.  Troponin negative.  After potassium repletion we will discharge on potassium supplements as well as a short prednisone taper for likely COPD exacerbation.  Patient states she will follow-up with her doctor on Tuesday for recheck/reevaluation and recheck of potassium level.    ____________________________________________   FINAL CLINICAL IMPRESSION(S) / ED DIAGNOSES  Dyspnea COPD exacerbation Hypokalemia   Minna Antis, MD 01/18/19 Rickey Primus

## 2019-01-18 NOTE — ED Notes (Signed)
Dr Lenard Lance notified of potassium 2.4

## 2019-01-18 NOTE — Discharge Instructions (Addendum)
Please take your medication as prescribed.  Please follow-up with your doctor on Tuesday for recheck/reevaluation as well as recheck of your potassium level.  Return to the emergency department for any weakness, chest pain or worsening shortness of breath.

## 2019-01-18 NOTE — ED Triage Notes (Signed)
Pt comes into the ED via EMS from home with c/o SOB for the past 4 days, pt uses home O2 PRN. States she has used her inhaler twice today . 100% on RA. Pt is a/ox4 on arrival pt states she lives with her mother and sister.

## 2019-03-04 ENCOUNTER — Other Ambulatory Visit: Payer: Self-pay | Admitting: Family Medicine

## 2019-03-04 DIAGNOSIS — Z1231 Encounter for screening mammogram for malignant neoplasm of breast: Secondary | ICD-10-CM

## 2019-05-27 IMAGING — CR DG CHEST 2V
1 series · 2 of 2 positions shown · non-contrast
Comparison: 01/13/2018

CLINICAL DATA: Wheezing for 6 months, broke ribs 3 months ago,
fever, chills, back pain, history COPD, smoker

EXAM:
CHEST - 2 VIEW

[Series 1: dg chest 2 view · 0.14mm/px · 2 of 2 slices shown]
[im 1/2]
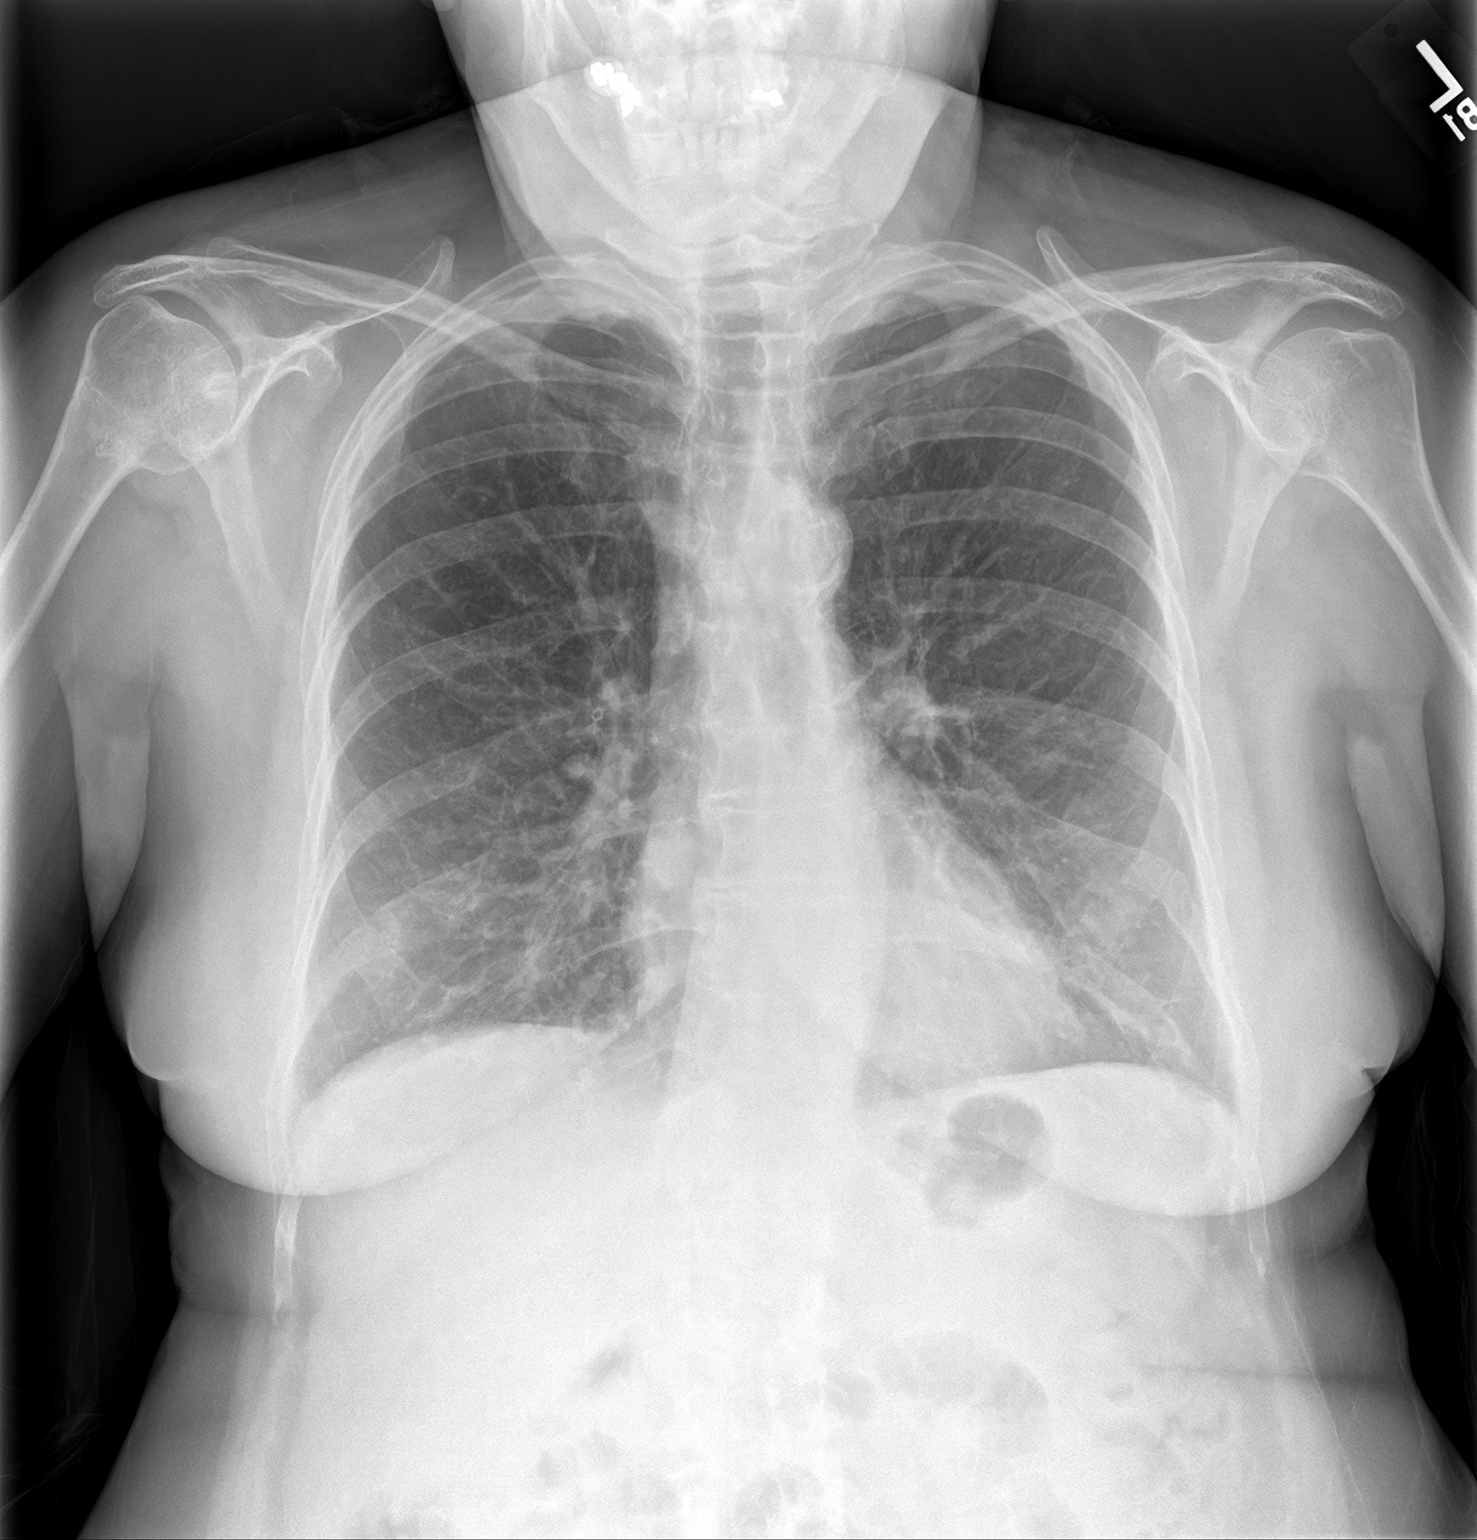
[im 2/2]
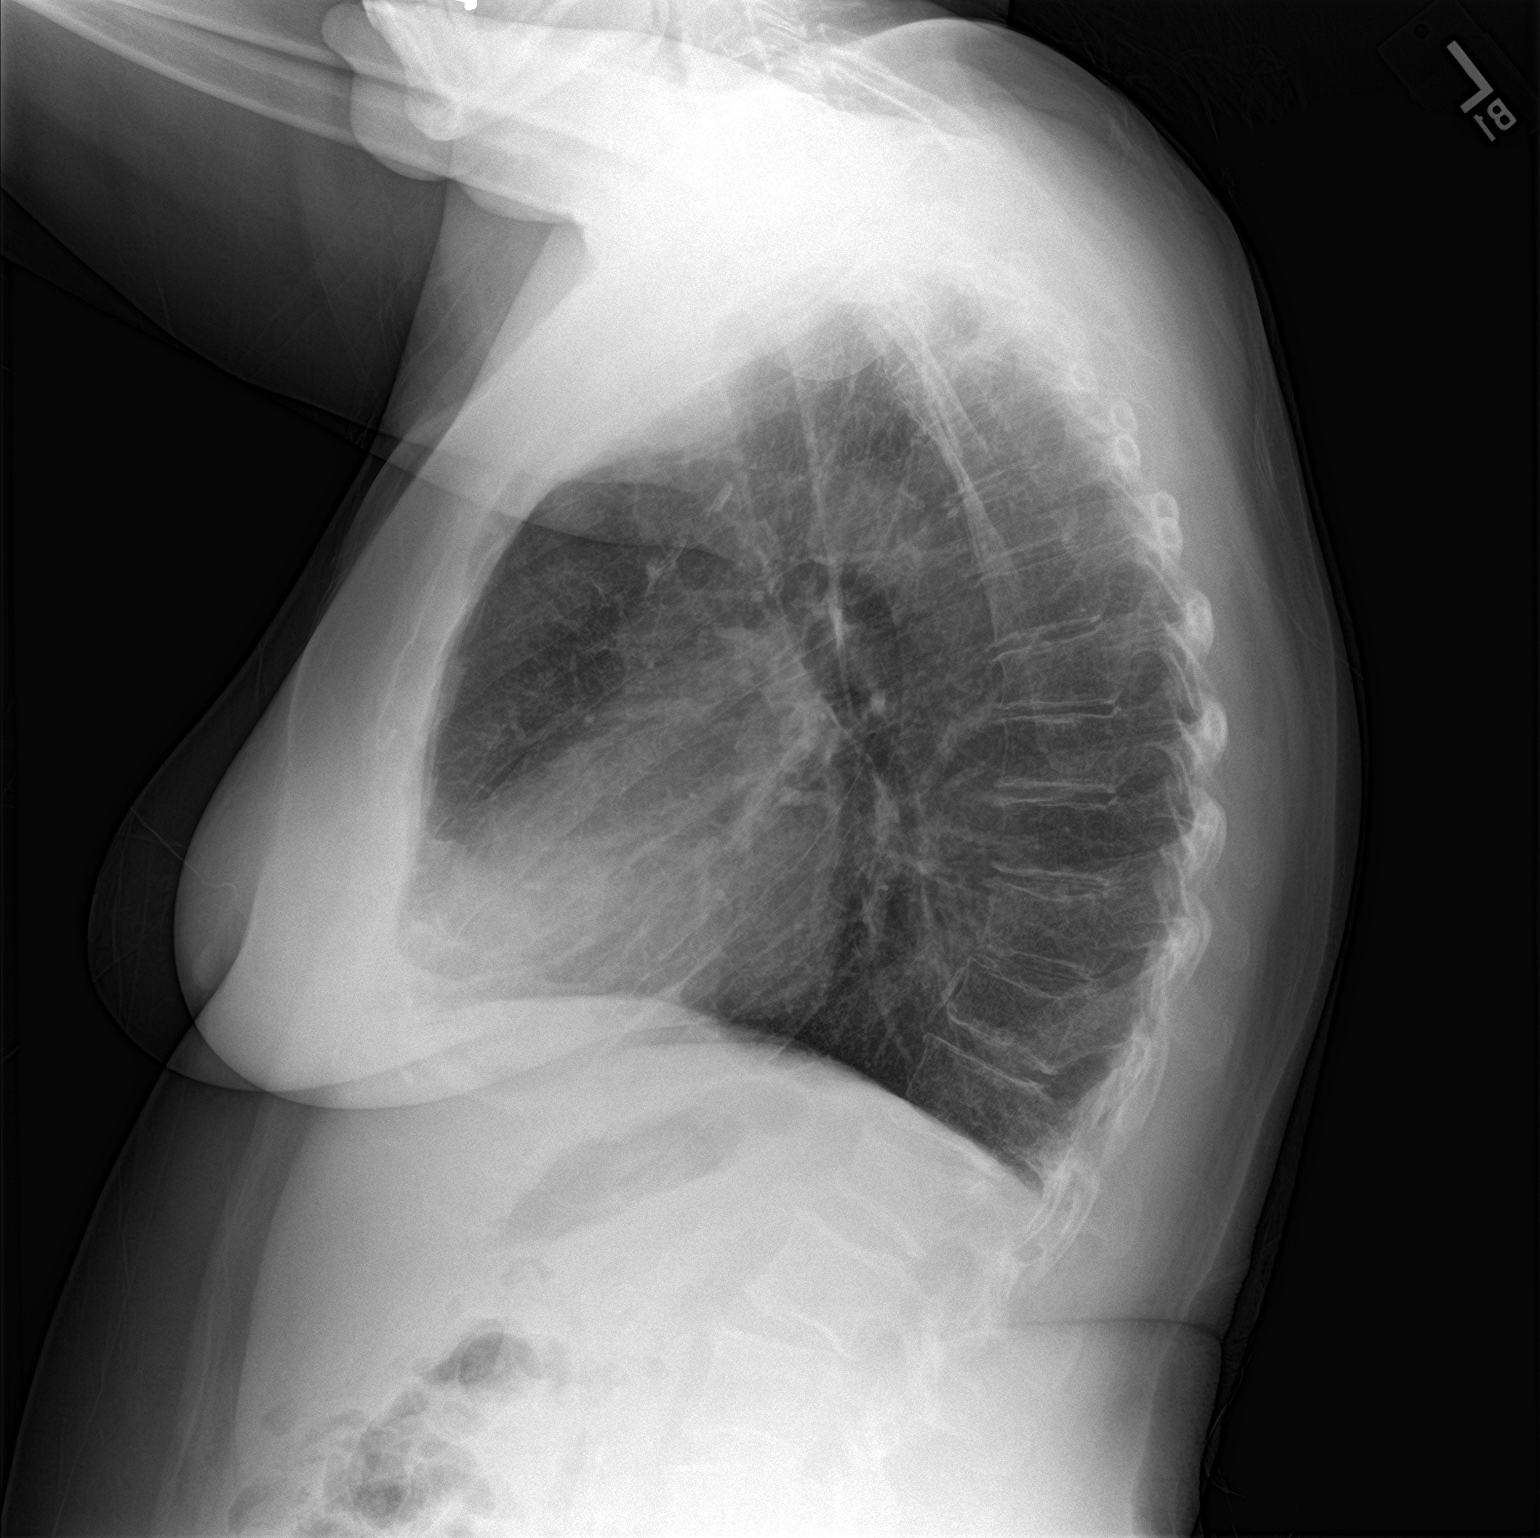

[2 of 2 positions shown; findings below may reference images not displayed]

FINDINGS: Normal heart size, mediastinal contours, and pulmonary vascularity.

Atherosclerotic calcification aorta.

Peribronchial thickening and minimal chronic interstitial
prominence, stable.

No acute infiltrate, pleural effusion or pneumothorax.

Bones demineralized.

Chronic compression deformity of vertebra at the thoracolumbar
junction question T11 and L1.
IMPRESSION: Chronic bronchitic changes without acute abnormality.

## 2019-06-01 ENCOUNTER — Encounter: Payer: Self-pay | Admitting: *Deleted

## 2019-06-01 ENCOUNTER — Other Ambulatory Visit: Payer: Self-pay

## 2019-06-05 ENCOUNTER — Other Ambulatory Visit
Admission: RE | Admit: 2019-06-05 | Discharge: 2019-06-05 | Disposition: A | Discharge status: Home / Self Care | Payer: Medicare HMO | Source: Ambulatory Visit | Attending: Ophthalmology | Admitting: Ophthalmology

## 2019-06-05 ENCOUNTER — Other Ambulatory Visit: Payer: Self-pay

## 2019-06-05 DIAGNOSIS — Z1159 Encounter for screening for other viral diseases: Secondary | ICD-10-CM | POA: Diagnosis present

## 2019-06-05 LAB — SARS CORONAVIRUS 2 (TAT 6-24 HRS): SARS Coronavirus 2: NEGATIVE

## 2019-06-05 NOTE — Discharge Instructions (Signed)

## 2019-06-09 ENCOUNTER — Ambulatory Visit
Admission: RE | Admit: 2019-06-09 | Discharge: 2019-06-09 | Disposition: A | Discharge status: Home / Self Care | Payer: Medicare HMO | Attending: Ophthalmology | Admitting: Ophthalmology

## 2019-06-09 ENCOUNTER — Encounter
Admission: RE | Disposition: A | Discharge status: Home / Self Care | Payer: Self-pay | Source: Home / Self Care | Attending: Ophthalmology

## 2019-06-09 ENCOUNTER — Ambulatory Visit: Payer: Medicare HMO | Admitting: Anesthesiology

## 2019-06-09 DIAGNOSIS — Z79899 Other long term (current) drug therapy: Secondary | ICD-10-CM | POA: Diagnosis not present

## 2019-06-09 DIAGNOSIS — I10 Essential (primary) hypertension: Secondary | ICD-10-CM | POA: Diagnosis not present

## 2019-06-09 DIAGNOSIS — F319 Bipolar disorder, unspecified: Secondary | ICD-10-CM | POA: Diagnosis not present

## 2019-06-09 DIAGNOSIS — H2512 Age-related nuclear cataract, left eye: Secondary | ICD-10-CM | POA: Diagnosis present

## 2019-06-09 DIAGNOSIS — F17200 Nicotine dependence, unspecified, uncomplicated: Secondary | ICD-10-CM | POA: Diagnosis not present

## 2019-06-09 DIAGNOSIS — F419 Anxiety disorder, unspecified: Secondary | ICD-10-CM | POA: Insufficient documentation

## 2019-06-09 DIAGNOSIS — Z791 Long term (current) use of non-steroidal anti-inflammatories (NSAID): Secondary | ICD-10-CM | POA: Diagnosis not present

## 2019-06-09 DIAGNOSIS — Z9981 Dependence on supplemental oxygen: Secondary | ICD-10-CM | POA: Diagnosis not present

## 2019-06-09 DIAGNOSIS — Z79890 Hormone replacement therapy (postmenopausal): Secondary | ICD-10-CM | POA: Insufficient documentation

## 2019-06-09 DIAGNOSIS — F209 Schizophrenia, unspecified: Secondary | ICD-10-CM | POA: Insufficient documentation

## 2019-06-09 DIAGNOSIS — Z86718 Personal history of other venous thrombosis and embolism: Secondary | ICD-10-CM | POA: Diagnosis not present

## 2019-06-09 DIAGNOSIS — J449 Chronic obstructive pulmonary disease, unspecified: Secondary | ICD-10-CM | POA: Insufficient documentation

## 2019-06-09 DIAGNOSIS — K219 Gastro-esophageal reflux disease without esophagitis: Secondary | ICD-10-CM | POA: Diagnosis not present

## 2019-06-09 HISTORY — DX: Headache, unspecified: R51.9

## 2019-06-09 HISTORY — PX: CATARACT EXTRACTION W/PHACO: SHX586

## 2019-06-09 HISTORY — DX: Unspecified osteoarthritis, unspecified site: M19.90

## 2019-06-09 SURGERY — PHACOEMULSIFICATION, CATARACT, WITH IOL INSERTION
Anesthesia: Monitor Anesthesia Care | Site: Eye | Laterality: Left

## 2019-06-09 MED ORDER — LIDOCAINE HCL (PF) 2 % IJ SOLN
INTRAOCULAR | Status: DC | PRN
  Administered 2019-06-09: 12:00:00 2 mL

## 2019-06-09 MED ORDER — ACETAMINOPHEN 160 MG/5ML PO SOLN: 325.0000 mg | ORAL | Status: DC | PRN

## 2019-06-09 MED ORDER — BRIMONIDINE TARTRATE-TIMOLOL 0.2-0.5 % OP SOLN
OPHTHALMIC | Status: DC | PRN
  Administered 2019-06-09: 1 [drp] via OPHTHALMIC

## 2019-06-09 MED ORDER — MIDAZOLAM HCL 2 MG/2ML IJ SOLN
INTRAMUSCULAR | Status: DC | PRN
  Administered 2019-06-09: 2 mg via INTRAVENOUS

## 2019-06-09 MED ORDER — ARMC OPHTHALMIC DILATING DROPS
1.0000 "application " | OPHTHALMIC | Status: DC | PRN
  Administered 2019-06-09 (×3): 1 via OPHTHALMIC

## 2019-06-09 MED ORDER — TRYPAN BLUE 0.06 % OP SOLN
OPHTHALMIC | Status: DC | PRN
  Administered 2019-06-09: 0.5 mL via INTRAOCULAR

## 2019-06-09 MED ORDER — FENTANYL CITRATE (PF) 100 MCG/2ML IJ SOLN
INTRAMUSCULAR | Status: DC | PRN
  Administered 2019-06-09 (×2): 50 ug via INTRAVENOUS

## 2019-06-09 MED ORDER — TETRACAINE HCL 0.5 % OP SOLN
1.0000 [drp] | OPHTHALMIC | Status: DC | PRN
  Administered 2019-06-09 (×3): 1 [drp] via OPHTHALMIC

## 2019-06-09 MED ORDER — PHENYLEPHRINE-KETOROLAC 1-0.3 % IO SOLN
INTRAOCULAR | Status: DC | PRN
  Administered 2019-06-09: 147 mL via OPHTHALMIC

## 2019-06-09 MED ORDER — ACETAMINOPHEN 325 MG PO TABS: 325.0000 mg | ORAL_TABLET | ORAL | Status: DC | PRN

## 2019-06-09 MED ORDER — NA CHONDROIT SULF-NA HYALURON 40-17 MG/ML IO SOLN
INTRAOCULAR | Status: DC | PRN
  Administered 2019-06-09: 1 mL via INTRAOCULAR

## 2019-06-09 MED ORDER — MOXIFLOXACIN HCL 0.5 % OP SOLN
OPHTHALMIC | Status: DC | PRN
  Administered 2019-06-09: 0.2 mL via OPHTHALMIC

## 2019-06-09 SURGICAL SUPPLY — 19 items
CANNULA ANT/CHMB 27G (MISCELLANEOUS) ×1 IMPLANT
CANNULA ANT/CHMB 27GA (MISCELLANEOUS) ×4 IMPLANT
GLOVE SURG LX 8.0 MICRO (GLOVE) ×1
GLOVE SURG LX STRL 8.0 MICRO (GLOVE) ×1 IMPLANT
GLOVE SURG TRIUMPH 8.0 PF LTX (GLOVE) ×2 IMPLANT
GOWN STRL REUS W/ TWL LRG LVL3 (GOWN DISPOSABLE) ×2 IMPLANT
GOWN STRL REUS W/TWL LRG LVL3 (GOWN DISPOSABLE) ×2
LENS IOL ACRYSOF IQ 20.5 (Intraocular Lens) ×1 IMPLANT
MARKER SKIN DUAL TIP RULER LAB (MISCELLANEOUS) ×2 IMPLANT
NDL FILTER BLUNT 18X1 1/2 (NEEDLE) ×1 IMPLANT
NDL RETROBULBAR .5 NSTRL (NEEDLE) ×2 IMPLANT
NEEDLE FILTER BLUNT 18X 1/2SAF (NEEDLE) ×1
NEEDLE FILTER BLUNT 18X1 1/2 (NEEDLE) ×1 IMPLANT
PACK EYE AFTER SURG (MISCELLANEOUS) ×2 IMPLANT
PACK OPTHALMIC (MISCELLANEOUS) ×2 IMPLANT
PACK PORFILIO (MISCELLANEOUS) ×2 IMPLANT
SYR 3ML LL SCALE MARK (SYRINGE) ×2 IMPLANT
SYR TB 1ML LUER SLIP (SYRINGE) ×2 IMPLANT
WIPE NON LINTING 3.25X3.25 (MISCELLANEOUS) ×2 IMPLANT

## 2019-06-09 NOTE — Op Note (Signed)
PREOPERATIVE DIAGNOSIS:  Nuclear sclerotic cataract of the left eye.   POSTOPERATIVE DIAGNOSIS:  Nuclear sclerotic cataract of the left eye.   OPERATIVE PROCEDURE: Procedure(s): CATARACT EXTRACTION PHACO AND INTRAOCULAR LENS PLACEMENT ([place]) COMPLICATED  LEFT   SURGEON:  Birder Robson, MD.   ANESTHESIA:  Anesthesiologist: Alisa Graff, MD CRNA: Jeannene Patella, CRNA  1.      Managed anesthesia care. 2.     0.18ml of Shugarcaine was instilled following the paracentesis   COMPLICATIONS:  None.   TECHNIQUE:   Stop and chop   DESCRIPTION OF PROCEDURE:  The patient was examined and consented in the preoperative holding area where the aforementioned topical anesthesia was applied to the left eye and then brought back to the Operating Room where the left eye was prepped and draped in the usual sterile ophthalmic fashion and a lid speculum was placed. A paracentesis was created with the side port blade and the anterior chamber was filled with viscoelastic. A near clear corneal incision was performed with the steel keratome. A continuous curvilinear capsulorrhexis was performed with a cystotome followed by the capsulorrhexis forceps. Hydrodissection and hydrodelineation were carried out with BSS on a blunt cannula. The lens was removed in a stop and chop  technique and the remaining cortical material was removed with the irrigation-aspiration handpiece. The capsular bag was inflated with viscoelastic and the Technis [id number] lens was placed in the capsular bag without complication. The remaining viscoelastic was removed from the eye with the irrigation-aspiration handpiece. The wounds were hydrated. The anterior chamber was flushed with BSS and the eye was inflated to physiologic pressure. 0.58ml Vigamox was placed in the anterior chamber. The wounds were found to be water tight. The eye was dressed with [place]. The patient was given protective glasses to wear throughout the day and a shield with  which to sleep tonight. The patient was also given drops with which to begin a drop regimen today and will follow-up with me in one day. Implant Name Type Inv. Item Serial No. Manufacturer Lot No. LRB No. Used Action  LENS IOL ACRYSOF IQ 20.5 - [id number] Intraocular Lens LENS IOL ACRYSOF IQ 20.5 [id number] ALCON  Left 1 Implanted    Procedure(s) with comments: CATARACT EXTRACTION PHACO AND INTRAOCULAR LENS PLACEMENT ([facility]) COMPLICATED  LEFT (Left) - [place]  Electronically signed: Birder Robson 06/09/2019 12:03 PM

## 2019-06-09 NOTE — Transfer of Care (Signed)
Immediate Anesthesia Transfer of Care Note  Patient: Krystal Lee  Procedure(s) Performed: CATARACT EXTRACTION PHACO AND INTRAOCULAR LENS PLACEMENT ([facility]) COMPLICATED  LEFT (Left Eye)  Patient Location: PACU  Anesthesia Type: MAC  Level of Consciousness: awake, alert  and patient cooperative  Airway and Oxygen Therapy: Patient Spontanous Breathing and Patient connected to supplemental oxygen  Post-op Assessment: Post-op Vital signs reviewed, Patient's Cardiovascular Status Stable, Respiratory Function Stable, Patent Airway and No signs of Nausea or vomiting  Post-op Vital Signs: Reviewed and stable  Complications: No apparent anesthesia complications

## 2019-06-09 NOTE — Anesthesia Preprocedure Evaluation (Signed)
Anesthesia Evaluation  Patient identified by MRN, date of birth, ID band Patient awake    Reviewed: Allergy & Precautions, H&P , NPO status , Patient's Chart, lab work & pertinent test results, reviewed documented beta blocker date and time   Airway Mallampati: II  TM Distance: >3 FB Neck ROM: full    Dental no notable dental hx.    Pulmonary COPD, Current Smoker,    Pulmonary exam normal breath sounds clear to auscultation       Cardiovascular Exercise Tolerance: Good hypertension,  Rhythm:regular Rate:Normal     Neuro/Psych  Headaches, PSYCHIATRIC DISORDERS Bipolar Disorder Schizophrenia    GI/Hepatic GERD  ,(+) Hepatitis -, C  Endo/Other  negative endocrine ROS  Renal/GU negative Renal ROS  negative genitourinary   Musculoskeletal   Abdominal   Peds  Hematology negative hematology ROS (+)   Anesthesia Other Findings   Reproductive/Obstetrics negative OB ROS                             Anesthesia Physical Anesthesia Plan  ASA: III  Anesthesia Plan: MAC   Post-op Pain Management:    Induction:   PONV Risk Score and Plan:   Airway Management Planned:   Additional Equipment:   Intra-op Plan:   Post-operative Plan:   Informed Consent: I have reviewed the patients History and Physical, chart, labs and discussed the procedure including the risks, benefits and alternatives for the proposed anesthesia with the patient or authorized representative who has indicated his/her understanding and acceptance.     Dental Advisory Given  Plan Discussed with: CRNA  Anesthesia Plan Comments:         Anesthesia Quick Evaluation

## 2019-06-09 NOTE — Anesthesia Postprocedure Evaluation (Signed)
Anesthesia Post Note  Patient: Krystal Lee  Procedure(s) Performed: CATARACT EXTRACTION PHACO AND INTRAOCULAR LENS PLACEMENT ([facility]) COMPLICATED  LEFT (Left Eye)  Patient location during evaluation: PACU Anesthesia Type: MAC Level of consciousness: awake and alert Pain management: pain level controlled Vital Signs Assessment: post-procedure vital signs reviewed and stable Respiratory status: spontaneous breathing, nonlabored ventilation, respiratory function stable and patient connected to nasal cannula oxygen Cardiovascular status: stable and blood pressure returned to baseline Postop Assessment: no apparent nausea or vomiting Anesthetic complications: no    Alisa Graff

## 2019-06-09 NOTE — H&P (Signed)
All labs reviewed. Abnormal studies sent to patients PCP when indicated.  Previous H&P reviewed, patient examined, there are NO CHANGES.  Nabil Bubolz Porfilio7/21/202011:21 AM

## 2019-06-10 ENCOUNTER — Encounter: Payer: Self-pay | Admitting: Ophthalmology

## 2019-07-15 ENCOUNTER — Other Ambulatory Visit: Payer: Self-pay | Admitting: Family Medicine

## 2019-07-15 ENCOUNTER — Ambulatory Visit
Admission: RE | Admit: 2019-07-15 | Discharge: 2019-07-15 | Disposition: A | Discharge status: Home / Self Care | Payer: Medicare HMO | Source: Ambulatory Visit | Attending: Family Medicine | Admitting: Family Medicine

## 2019-07-15 DIAGNOSIS — Z1231 Encounter for screening mammogram for malignant neoplasm of breast: Secondary | ICD-10-CM | POA: Diagnosis not present

## 2019-07-15 DIAGNOSIS — M545 Low back pain, unspecified: Secondary | ICD-10-CM

## 2019-07-15 DIAGNOSIS — M5136 Other intervertebral disc degeneration, lumbar region: Secondary | ICD-10-CM | POA: Diagnosis not present

## 2019-07-15 DIAGNOSIS — I700 Atherosclerosis of aorta: Secondary | ICD-10-CM | POA: Insufficient documentation

## 2019-11-05 ENCOUNTER — Other Ambulatory Visit: Payer: Self-pay | Admitting: Oncology

## 2019-11-05 DIAGNOSIS — D696 Thrombocytopenia, unspecified: Secondary | ICD-10-CM

## 2019-11-11 ENCOUNTER — Other Ambulatory Visit: Payer: Self-pay

## 2019-11-11 ENCOUNTER — Inpatient Hospital Stay (HOSPITAL_BASED_OUTPATIENT_CLINIC_OR_DEPARTMENT_OTHER): Payer: Medicare HMO | Admitting: Oncology

## 2019-11-11 ENCOUNTER — Encounter: Payer: Self-pay | Admitting: Oncology

## 2019-11-11 ENCOUNTER — Inpatient Hospital Stay: Payer: Medicare HMO | Attending: Oncology

## 2019-11-11 VITALS — BP 114/70 | HR 109 | Temp 98.1°F | Resp 20 | Wt 155.0 lb

## 2019-11-11 DIAGNOSIS — J449 Chronic obstructive pulmonary disease, unspecified: Secondary | ICD-10-CM | POA: Diagnosis not present

## 2019-11-11 DIAGNOSIS — K219 Gastro-esophageal reflux disease without esophagitis: Secondary | ICD-10-CM | POA: Insufficient documentation

## 2019-11-11 DIAGNOSIS — M1990 Unspecified osteoarthritis, unspecified site: Secondary | ICD-10-CM | POA: Insufficient documentation

## 2019-11-11 DIAGNOSIS — Z79899 Other long term (current) drug therapy: Secondary | ICD-10-CM | POA: Diagnosis not present

## 2019-11-11 DIAGNOSIS — R5383 Other fatigue: Secondary | ICD-10-CM | POA: Insufficient documentation

## 2019-11-11 DIAGNOSIS — D696 Thrombocytopenia, unspecified: Secondary | ICD-10-CM

## 2019-11-11 DIAGNOSIS — D582 Other hemoglobinopathies: Secondary | ICD-10-CM | POA: Insufficient documentation

## 2019-11-11 DIAGNOSIS — Z803 Family history of malignant neoplasm of breast: Secondary | ICD-10-CM | POA: Diagnosis not present

## 2019-11-11 DIAGNOSIS — F259 Schizoaffective disorder, unspecified: Secondary | ICD-10-CM | POA: Insufficient documentation

## 2019-11-11 DIAGNOSIS — F17210 Nicotine dependence, cigarettes, uncomplicated: Secondary | ICD-10-CM | POA: Diagnosis not present

## 2019-11-11 DIAGNOSIS — B1920 Unspecified viral hepatitis C without hepatic coma: Secondary | ICD-10-CM | POA: Diagnosis not present

## 2019-11-11 DIAGNOSIS — Z791 Long term (current) use of non-steroidal anti-inflammatories (NSAID): Secondary | ICD-10-CM | POA: Diagnosis not present

## 2019-11-11 DIAGNOSIS — B182 Chronic viral hepatitis C: Secondary | ICD-10-CM | POA: Diagnosis not present

## 2019-11-11 DIAGNOSIS — I10 Essential (primary) hypertension: Secondary | ICD-10-CM | POA: Insufficient documentation

## 2019-11-11 LAB — COMPREHENSIVE METABOLIC PANEL
ALT: 17 U/L (ref 0–44)
AST: 15 U/L (ref 15–41)
Albumin: 3.8 g/dL (ref 3.5–5.0)
Alkaline Phosphatase: 78 U/L (ref 38–126)
Anion gap: 11 (ref 5–15)
BUN: 7 mg/dL — ABNORMAL LOW (ref 8–23)
CO2: 18 mmol/L — ABNORMAL LOW (ref 22–32)
Calcium: 8.6 mg/dL — ABNORMAL LOW (ref 8.9–10.3)
Chloride: 108 mmol/L (ref 98–111)
Creatinine, Ser: 0.51 mg/dL (ref 0.44–1.00)
GFR calc Af Amer: >60 mL/min (ref >60)
GFR calc non Af Amer: >60 mL/min (ref >60)
Glucose, Bld: 94 mg/dL (ref 70–99)
Potassium: 4.2 mmol/L (ref 3.5–5.1)
Sodium: 137 mmol/L (ref 135–145)
Total Bilirubin: 0.3 mg/dL (ref 0.3–1.2)
Total Protein: 6.8 g/dL (ref 6.5–8.1)

## 2019-11-11 LAB — IMMATURE PLATELET FRACTION: Immature Platelet Fraction: 6.4 % (ref 1.2–8.6)

## 2019-11-11 NOTE — Progress Notes (Signed)
Hematology/Oncology Consult note [facility] [phone] [phone]   Patient Care Team: Armando GangLindley, Cheryl P, FNP as PCP - General (Family Medicine)  REFERRING PROVIDER: Myrle ShengLindley Chery CHIEF COMPLAINTS/PURPOSE OF CONSULTATION:  Elevated blood count  HISTORY OF PRESENTING ILLNESS:  Krystal Lee is a 63 y.o.  female with PMH listed below who was referred to me for evaluation of thrombocytopenia.  Patient reports feeling well, without any complaints today. She had labs done with Primary care provider [name] in May 2018 and also August 2018. Labs in May 2018 showed platelet was clumped. Labs in August 2018 showed borderline  Patient had blood work done in 2018 for thrombocytopenia.  From cytopenia resolved.  #INTERVAL HISTORY Krystal Shutterrudy Faye Carsey is a 63 y.o. female who has above history reviewed by me today presents for follow up visit for management of elevated hemoglobin Problems and complaints are listed below: 10/08/2019 CBC showed hemoglobin 15, hematocrit 47, MCV 99. 25 hydroxy vitamin D 15.3 Patient was referred back to cancer center for evaluation of elevated hemoglobin. Patient has no new complaints. She has not establish care with gastroenterology for chronic hepatitis C Chronic acid reflux, symptom improved after recently being started on PPI.  Review of Systems  Constitutional: Positive for fatigue. Negative for appetite change, chills and fever.  HENT:   Negative for hearing loss and voice change.   Eyes: Negative for eye problems.  Respiratory: Negative for chest tightness and cough.   Cardiovascular: Negative for chest pain.  Gastrointestinal: Negative for abdominal distention, abdominal pain and blood in stool.  Endocrine: Negative for hot flashes.  Genitourinary: Negative for difficulty urinating and frequency.   Musculoskeletal: Negative for arthralgias.  Skin: Negative for itching and rash.  Neurological:  Negative for extremity weakness.  Hematological: Negative for adenopathy.  Psychiatric/Behavioral: Negative for confusion.     MEDICAL HISTORY:  Past Medical History:  Diagnosis Date  . Anxiety   . Arthritis    knees  . Bipolar 1 disorder (HCC)   . COPD (chronic obstructive pulmonary disease) (HCC)   . Depression   . GERD (gastroesophageal reflux disease)   . Headache    during allergy season  . Hepatitis C virus   . Hypertension   . Post-menopausal   . Schizoaffective disorder (HCC)     SURGICAL HISTORY: Past Surgical History:  Procedure Laterality Date  . CATARACT EXTRACTION  2010   right   . CATARACT EXTRACTION W/PHACO Left 06/09/2019   Procedure: CATARACT EXTRACTION PHACO AND INTRAOCULAR LENS PLACEMENT (IOC) COMPLICATED  LEFT;  Surgeon: Galen ManilaPorfilio, William, MD;  [facility];  Service: Ophthalmology;  Laterality: Left;  VISION BLUE  [place]  . LOWER EXTREMITY ANGIOGRAPHY Bilateral 08/31/2013   [facility], Dr. Wyn Quakerew    SOCIAL HISTORY: Social History   Socioeconomic History  . Marital status: Widowed    Spouse name: Not on file  . Number of children: Not on file  . Years of education: Not on file  . Highest education level: Not on file  Occupational History  . Not on file  Tobacco Use  . Smoking status: Current Every Day Smoker    Packs/day: 1.00    Years: 45.00    Pack years: 45.00    Types: Cigarettes  . Smokeless tobacco: Never Used  . Tobacco comment: since age 63  Substance and Sexual Activity  . Alcohol use: Not Currently    Alcohol/week: 1.0 standard drinks    Types: 1 Cans of beer per week  .  Drug use: No  . Sexual activity: Not on file  Other Topics Concern  . Not on file  Social History Narrative  . Not on file   Social Determinants of Health   Financial Resource Strain:   . Difficulty of Paying Living Expenses: Not on file  Food Insecurity:   . Worried About Programme researcher, broadcasting/film/video in the Last Year: Not on file  . Ran Out of Food  in the Last Year: Not on file  Transportation Needs:   . Lack of Transportation (Medical): Not on file  . Lack of Transportation (Non-Medical): Not on file  Physical Activity:   . Days of Exercise per Week: Not on file  . Minutes of Exercise per Session: Not on file  Stress:   . Feeling of Stress : Not on file  Social Connections:   . Frequency of Communication with Friends and Family: Not on file  . Frequency of Social Gatherings with Friends and Family: Not on file  . Attends Religious Services: Not on file  . Active Member of Clubs or Organizations: Not on file  . Attends Banker Meetings: Not on file  . Marital Status: Not on file  Intimate Partner Violence:   . Fear of Current or Ex-Partner: Not on file  . Emotionally Abused: Not on file  . Physically Abused: Not on file  . Sexually Abused: Not on file    FAMILY HISTORY: Family History  Problem Relation Age of Onset  . Breast cancer Paternal Grandmother   . Bone cancer Paternal Grandmother   . Pancreatic cancer Mother   . Heart disease Father   . Heart disease Paternal Uncle   . Stroke Paternal Grandfather     ALLERGIES:  has No Known Allergies.  MEDICATIONS:  Current Outpatient Medications  Medication Sig Dispense Refill  . albuterol (PROVENTIL HFA;VENTOLIN HFA) 108 (90 Base) MCG/ACT inhaler Inhale 1-2 puffs into the lungs every 6 (six) hours as needed for wheezing or shortness of breath.    Marland Kitchen alendronate (FOSAMAX) 70 MG tablet Take 70 mg by mouth every Saturday.     . ALPRAZolam (XANAX) 0.5 MG tablet Take 0.5 mg by mouth 2 (two) times daily as needed.    . clotrimazole (MYCELEX) 10 MG troche Take 10 mg by mouth 5 (five) times daily.    . cyclobenzaprine (FLEXERIL) 10 MG tablet Take 10 mg by mouth daily as needed for muscle spasms.     Marland Kitchen estrogens, conjugated, (PREMARIN) 0.3 MG tablet Take 0.3 mg by mouth daily. Take daily for 21 days then do not take for 7 days.    Marland Kitchen ibuprofen (ADVIL,MOTRIN) 800 MG  tablet Take 800 mg by mouth every 8 (eight) hours as needed for mild pain or moderate pain.     . medroxyPROGESTERone (PROVERA) 2.5 MG tablet Take 2.5 mg by mouth See admin instructions. Daily the last 5 days of each month    . metoprolol succinate (TOPROL-XL) 50 MG 24 hr tablet Take 50 mg by mouth daily.     . pantoprazole (PROTONIX) 40 MG tablet Take 40 mg by mouth daily.    . QUEtiapine (SEROQUEL) 200 MG tablet Take 200 mg by mouth at bedtime.     . roflumilast (DALIRESP) 500 MCG TABS tablet Take 500 mcg by mouth daily.    . rosuvastatin (CRESTOR) 40 MG tablet Take 40 mg by mouth at bedtime.    . TRELEGY ELLIPTA 100-62.5-25 MCG/INH AEPB Inhale 1 puff into the lungs daily.  No current facility-administered medications for this visit.      Marland Kitchen  PHYSICAL EXAMINATION: ECOG PERFORMANCE STATUS: 0 - Asymptomatic Vitals:   11/11/19 1331  BP: 114/70  Pulse: (!) 109  Resp: 20  Temp: 98.1 F (36.7 C)   Filed Weights   11/11/19 1331  Weight: 155 lb (70.3 kg)     LABORATORY DATA:  I have reviewed the data as listed Lab Results  Component Value Date   WBC 10.2 11/11/2019   HGB 13.2 11/11/2019   HCT 40.0 11/11/2019   MCV 92.8 11/11/2019   PLT 266 11/11/2019   Recent Labs    11/21/18 1644 01/18/19 1601 11/11/19 1314  NA 135 136 137  K 3.6 2.4* 4.2  CL 107 101 108  CO2 20* 19* 18*  GLUCOSE 127* 117* 94  BUN 15 7* 7*  CREATININE 0.55 0.62 0.51  CALCIUM 8.7* 9.1 8.6*  GFRNONAA >60 >60 >60  GFRAA >60 >60 >60  PROT  --   --  6.8  ALBUMIN  --   --  3.8  AST  --   --  15  ALT  --   --  17  ALKPHOS  --   --  78  BILITOT  --   --  0.3    RADIOGRAPHIC STUDIES: I have personally reviewed the radiological images as listed and agreed with the findings in the report. 09/23/2017 US abdomen Questionable nodular thickening of the gallbladder versus artifact from underdistention, less likely adjacent lymph node; recommend RIGHT upper quadrant limited ultrasound following patient  being completely NPO for 8 hours to reassess gallbladder wall thickness and exclude focal mass.    ASSESSMENT & PLAN:  1. Elevated hemoglobin (HCC)   2. Hepatitis C virus infection without hepatic coma, unspecified chronicity    #Repeat CBC showed a normal hemoglobin of 13.2. Normal white count and platelet count.   I will hold additional work-up at this point.   chronic hepatitis C, patient has not establish care with gastroenterology yet.  Per patient, she was referred to gastroenterology for acid reflux and after taking Protonix, she feels symptoms has improved so she canceled appointment.  I discussed about importance of establish care with gastroenterologist for chronic hepatitis C treatments.  She voices understanding and will call back to make her appointment. All questions were answered. The patient knows to call the clinic with any problems questions or concerns. Thank you for this kind referral and the opportunity to participate in the care of this patient. A copy of today's note is routed to referring provider    Earlie Server, MD, PhD Hematology Oncology [facility] at [facility] Pager- [phone] 11/11/2019

## 2019-11-11 NOTE — Progress Notes (Signed)
Patient would like to discuss Vitamin D level which is followed by PCP but they advised her discuss with Dr. Tasia Catchings.

## 2019-11-12 LAB — CBC WITH DIFFERENTIAL/PLATELET
Eosinophils Absolute: 0.2 10*3/uL (ref 0.0–0.5)
Eosinophils Relative: 2 %
HCT: 40 % (ref 36.0–46.0)
Hemoglobin: 13.2 g/dL (ref 12.0–15.0)
Lymphocytes Relative: 32 %
Lymphs Abs: 3.3 10*3/uL (ref 0.7–4.0)
MCH: 30.6 pg (ref 26.0–34.0)
MCHC: 33 g/dL (ref 30.0–36.0)
MCV: 92.8 fL (ref 80.0–100.0)
Monocytes Absolute: 0.7 10*3/uL (ref 0.1–1.0)
Monocytes Relative: 7 %
Neutro Abs: 6 10*3/uL (ref 1.7–7.7)
Neutrophils Relative %: 59 %
Platelets: 266 10*3/uL (ref 150–400)
RBC: 4.31 MIL/uL (ref 3.87–5.11)
RDW: 14.6 % (ref 11.5–15.5)
WBC: 10.2 10*3/uL (ref 4.0–10.5)
nRBC: 0 % (ref 0.0–0.2)

## 2019-11-12 LAB — TECHNOLOGIST SMEAR REVIEW
Plt Morphology: ADEQUATE
RBC Morphology: NORMAL

## 2020-01-06 IMAGING — DX DG CHEST 1V PORT
1 series · 1 of 1 positions shown · non-contrast
Comparison: 07/28/2018

CLINICAL DATA: Cough, shortness of breath

EXAM:
PORTABLE CHEST 1 VIEW

[chest ap]
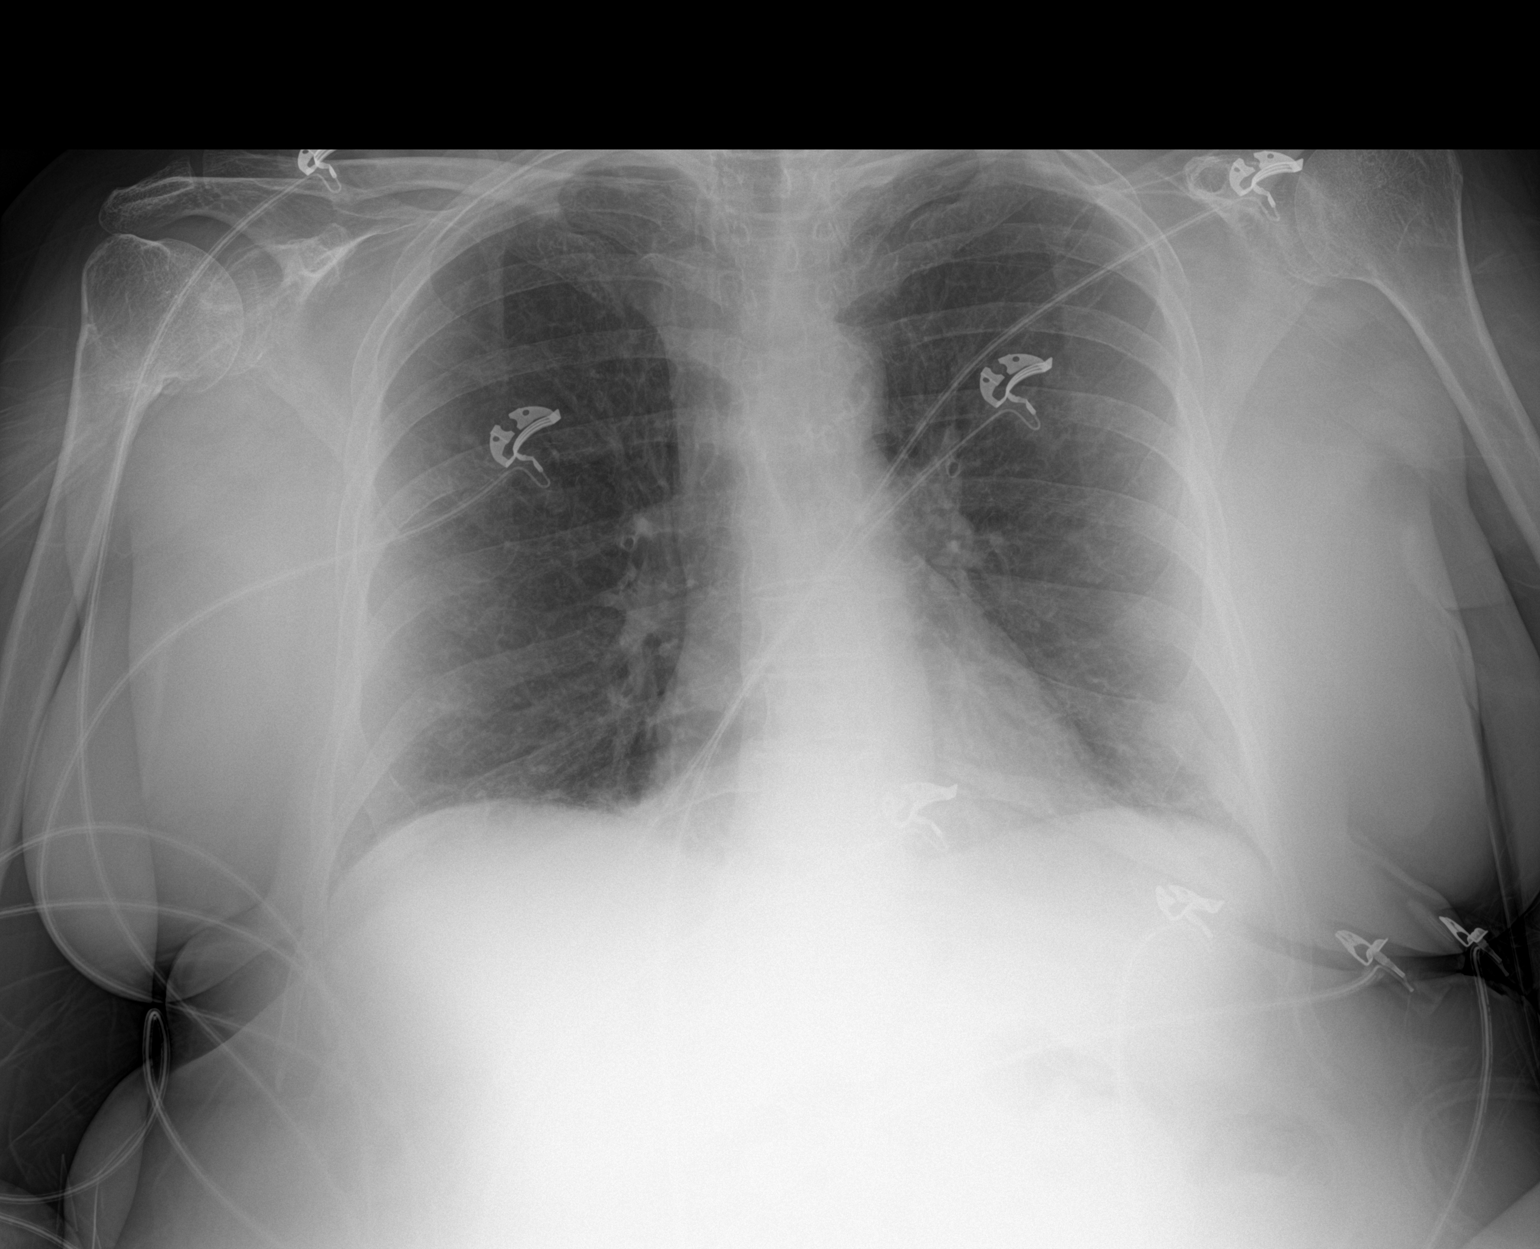

[1 of 1 positions shown; findings below may reference images not displayed]

FINDINGS: There is no focal parenchymal opacity. There is no pleural effusion
or pneumothorax. The heart and mediastinal contours are
unremarkable.

The osseous structures are unremarkable.
IMPRESSION: No active disease.

## 2020-02-25 ENCOUNTER — Other Ambulatory Visit: Payer: Self-pay

## 2020-02-25 ENCOUNTER — Encounter: Payer: Self-pay | Admitting: Emergency Medicine

## 2020-02-25 ENCOUNTER — Emergency Department: Payer: Medicare HMO

## 2020-02-25 ENCOUNTER — Emergency Department
Admission: EM | Admit: 2020-02-25 | Discharge: 2020-02-25 | Disposition: A | Discharge status: Home / Self Care | Payer: Medicare HMO | Attending: Emergency Medicine | Admitting: Emergency Medicine

## 2020-02-25 DIAGNOSIS — I10 Essential (primary) hypertension: Secondary | ICD-10-CM | POA: Insufficient documentation

## 2020-02-25 DIAGNOSIS — R0602 Shortness of breath: Secondary | ICD-10-CM | POA: Diagnosis present

## 2020-02-25 DIAGNOSIS — F17210 Nicotine dependence, cigarettes, uncomplicated: Secondary | ICD-10-CM | POA: Insufficient documentation

## 2020-02-25 DIAGNOSIS — J441 Chronic obstructive pulmonary disease with (acute) exacerbation: Secondary | ICD-10-CM | POA: Insufficient documentation

## 2020-02-25 DIAGNOSIS — Z20822 Contact with and (suspected) exposure to covid-19: Secondary | ICD-10-CM | POA: Diagnosis not present

## 2020-02-25 LAB — TROPONIN I (HIGH SENSITIVITY)
Troponin I (High Sensitivity): 4 ng/L (ref <18)
Troponin I (High Sensitivity): 4 ng/L (ref <18)

## 2020-02-25 LAB — BASIC METABOLIC PANEL
Anion gap: 7 (ref 5–15)
BUN: 19 mg/dL (ref 8–23)
CO2: 18 mmol/L — ABNORMAL LOW (ref 22–32)
Calcium: 9.2 mg/dL (ref 8.9–10.3)
Chloride: 110 mmol/L (ref 98–111)
Creatinine, Ser: 0.9 mg/dL (ref 0.44–1.00)
GFR calc Af Amer: >60 mL/min (ref >60)
GFR calc non Af Amer: >60 mL/min (ref >60)
Glucose, Bld: 141 mg/dL — ABNORMAL HIGH (ref 70–99)
Potassium: 4 mmol/L (ref 3.5–5.1)
Sodium: 135 mmol/L (ref 135–145)

## 2020-02-25 LAB — CBC
HCT: 42.1 % (ref 36.0–46.0)
Hemoglobin: 14.5 g/dL (ref 12.0–15.0)
MCH: 31.8 pg (ref 26.0–34.0)
MCHC: 34.4 g/dL (ref 30.0–36.0)
MCV: 92.3 fL (ref 80.0–100.0)
Platelets: 359 10*3/uL (ref 150–400)
RBC: 4.56 MIL/uL (ref 3.87–5.11)
RDW: 13.1 % (ref 11.5–15.5)
WBC: 12.5 10*3/uL — ABNORMAL HIGH (ref 4.0–10.5)
nRBC: 0 % (ref 0.0–0.2)

## 2020-02-25 MED ORDER — HYDROCOD POLST-CPM POLST ER 10-8 MG/5ML PO SUER: 5.0000 mL | Freq: Two times a day (BID) | ORAL | 0 refills | Status: DC

## 2020-02-25 MED ORDER — HYDROCOD POLST-CPM POLST ER 10-8 MG/5ML PO SUER
5.0000 mL | Freq: Once | ORAL | Status: AC
  Administered 2020-02-25: 5 mL via ORAL
  Filled 2020-02-25: qty 5

## 2020-02-25 NOTE — ED Provider Notes (Signed)
[facility] Emergency Department Provider Note       Time seen: ----------------------------------------- 9:53 PM on 02/25/2020 -----------------------------------------   I have reviewed the triage vital signs and the nursing notes.  HISTORY   Chief Complaint Shortness of Breath   HPI Krystal Lee is a 64 y.o. female with a history of anxiety, arthritis, bipolar disorder, COPD, GERD, hepatitis C, hypertension, schizoaffective disorder who presents to the ED for shortness of breath.  Patient's been having chest tightness as well, think she may have pneumonia which has felt similarly in the past.  Her doctor placed her on prednisone and antibiotics and she started to get better but is now worse.  She has 2 days left of antibiotics.  Past Medical History:  Diagnosis Date  . Anxiety   . Arthritis    knees  . Bipolar 1 disorder ([place])   . COPD (chronic obstructive pulmonary disease) ([place])   . Depression   . GERD (gastroesophageal reflux disease)   . Headache    during allergy season  . Hepatitis C virus   . Hypertension   . Post-menopausal   . Schizoaffective disorder [facility])     Patient Active Problem List   Diagnosis Date Noted  . COPD exacerbation ([place]) 07/28/2018  . Acute on chronic respiratory failure with hypoxia and hypercapnia ([place]) 09/16/2017  . COPD with acute exacerbation ([place]) 09/16/2017    Past Surgical History:  Procedure Laterality Date  . CATARACT EXTRACTION  2010   right   . CATARACT EXTRACTION W/PHACO Left 06/09/2019   Procedure: CATARACT EXTRACTION PHACO AND INTRAOCULAR LENS PLACEMENT ([place]) COMPLICATED  LEFT;  Surgeon: Birder Robson, MD;  Location: [place];  Service: Ophthalmology;  Laterality: Left;  VISION BLUE  [place]  . LOWER EXTREMITY ANGIOGRAPHY Bilateral 08/31/2013   [facility], Dr. Lucky Cowboy    Allergies Patient has no known allergies.  Social History Social History   Tobacco Use  . Smoking status:  Current Every Day Smoker    Packs/day: 1.00    Years: 45.00    Pack years: 45.00    Types: Cigarettes  . Smokeless tobacco: Never Used  . Tobacco comment: since age 80  Substance Use Topics  . Alcohol use: Not Currently    Alcohol/week: 1.0 standard drinks    Types: 1 Cans of beer per week  . Drug use: No    Review of Systems Constitutional: Negative for fever. Cardiovascular: Positive for chest tightness Respiratory: Positive for shortness of breath Gastrointestinal: Negative for abdominal pain, vomiting and diarrhea. Musculoskeletal: Negative for back pain. Skin: Negative for rash. Neurological: Negative for headaches, focal weakness or numbness.  All systems negative/normal/unremarkable except as stated in the HPI  ____________________________________________   PHYSICAL EXAM:  VITAL SIGNS: ED Triage Vitals  Enc Vitals Group     BP 02/25/20 1436 125/88     Pulse Rate 02/25/20 1436 96     Resp 02/25/20 1436 20     Temp 02/25/20 1436 97.8 F (36.6 C)     Temp Source 02/25/20 1436 Oral     SpO2 02/25/20 1436 97 %     Weight 02/25/20 1435 165 lb (74.8 kg)     Height 02/25/20 1435 4\' 11"  (1.499 m)     Head Circumference --      Peak Flow --      Pain Score 02/25/20 1435 10     Pain Loc --      Pain Edu? --      Excl.  in GC? --     Constitutional: Alert and oriented. Well appearing and in no distress. Eyes: Conjunctivae are normal. Normal extraocular movements. Cardiovascular: Normal rate, regular rhythm. No murmurs, rubs, or gallops. Respiratory: Normal respiratory effort without tachypnea nor retractions. Breath sounds are clear and equal bilaterally. No wheezes/rales/rhonchi. Gastrointestinal: Soft and nontender. Normal bowel sounds Musculoskeletal: Nontender with normal range of motion in extremities. No lower extremity tenderness nor edema. Neurologic:  Normal speech and language. No gross focal neurologic deficits are appreciated.  Skin:  Skin is warm, dry  and intact. No rash noted. Psychiatric: Mood and affect are normal. Speech and behavior are normal.  ____________________________________________  EKG: Interpreted by me.  Sinus rhythm with a rate of 95 bpm, normal PR interval, normal QRS, normal QT  ____________________________________________  ED COURSE:  As part of my medical decision making, I reviewed the following data within the electronic MEDICAL RECORD NUMBER History obtained from family if available, nursing notes, old chart and ekg, as well as notes from prior ED visits. Patient presented for chest tightness and shortness of breath, we will assess with labs and imaging as indicated at this time.   Procedures  Krystal Lee was evaluated in Emergency Department on 02/25/2020 for the symptoms described in the history of present illness. She was evaluated in the context of the global COVID-19 pandemic, which necessitated consideration that the patient might be at risk for infection with the SARS-CoV-2 virus that causes COVID-19. Institutional protocols and algorithms that pertain to the evaluation of patients at risk for COVID-19 are in a state of rapid change based on information released by regulatory bodies including the CDC and federal and state organizations. These policies and algorithms were followed during the patient's care in the ED.  ____________________________________________   LABS (pertinent positives/negatives)  Labs Reviewed  BASIC METABOLIC PANEL - Abnormal; Notable for the following components:      Result Value   CO2 18 (*)    Glucose, Bld 141 (*)    All other components within normal limits  CBC - Abnormal; Notable for the following components:   WBC 12.5 (*)    All other components within normal limits  TROPONIN I (HIGH SENSITIVITY)  TROPONIN I (HIGH SENSITIVITY)    RADIOLOGY Images were viewed by me Chest x-ray IMPRESSION: No acute cardiopulmonary disease.  Chronic changes.  Slightly progressive L1  compression deformity. Multiple old bilateral rib fractures.   ____________________________________________   DIFFERENTIAL DIAGNOSIS   CHF, COPD, pneumonia, MI, unstable angina, PE, musculoskeletal pain, anxiety  FINAL ASSESSMENT AND PLAN  Chest pain, shortness of breath   Plan: The patient had presented for chest tightness and shortness of breath. Patient's labs were overall reassuring including repeat troponin testing. Patient's imaging did not reveal any acute infectious process.  She does have progressive L1 compression deformity in her back but otherwise is cleared for outpatient follow-up.   Ulice Dash, MD    Note: This note was generated in part or whole with voice recognition software. Voice recognition is usually quite accurate but there are transcription errors that can and very often do occur. I apologize for any typographical errors that were not detected and corrected.     Emily Filbert, MD 02/25/20 2155

## 2020-02-25 NOTE — ED Notes (Signed)
MD at bedside. Pt denies questions at this time and agreeable with plan to discharge home with medications for comfort and cough.

## 2020-02-25 NOTE — ED Triage Notes (Signed)
Pt in via EMS from home with c/o difficulty breathing. Hx of COPD and HTN. EMS reports per pt she has been on abx since Feb and started to feel better but then got worse. Pt on last 2 days of amoxicillin but still having stabbing abd pain. Last fever 2 days ago. temp 98.5, 98%RA, 140/100. EMS reports pt also with constant diarrhea

## 2020-02-25 NOTE — ED Triage Notes (Signed)
Pt here for [facility]. Has been having chest tightness as well.  Thinks may have PNA, feels similar.  Doctor put on prednisone and abx and started getting better but now worse; has 2 days left of abx. Unlabored currently. VSS at this time.  No fever.

## 2020-02-26 ENCOUNTER — Telehealth: Payer: Self-pay | Admitting: General Practice

## 2020-02-26 LAB — SARS CORONAVIRUS 2 (TAT 6-24 HRS): SARS Coronavirus 2: NEGATIVE

## 2020-02-26 NOTE — Telephone Encounter (Signed)
Pt is aware covid 19 result is neg on 02-26-2020

## 2020-03-16 ENCOUNTER — Other Ambulatory Visit: Payer: Self-pay | Admitting: Orthopedic Surgery

## 2020-03-16 DIAGNOSIS — G8929 Other chronic pain: Secondary | ICD-10-CM

## 2020-03-16 DIAGNOSIS — S32050A Wedge compression fracture of fifth lumbar vertebra, initial encounter for closed fracture: Secondary | ICD-10-CM

## 2020-03-16 DIAGNOSIS — S32010A Wedge compression fracture of first lumbar vertebra, initial encounter for closed fracture: Secondary | ICD-10-CM

## 2020-03-16 DIAGNOSIS — S22080A Wedge compression fracture of T11-T12 vertebra, initial encounter for closed fracture: Secondary | ICD-10-CM

## 2020-03-16 DIAGNOSIS — M545 Low back pain, unspecified: Secondary | ICD-10-CM

## 2020-03-30 ENCOUNTER — Ambulatory Visit
Admission: RE | Admit: 2020-03-30 | Discharge: 2020-03-30 | Disposition: A | Discharge status: Home / Self Care | Payer: Medicare HMO | Source: Ambulatory Visit | Attending: Orthopedic Surgery | Admitting: Orthopedic Surgery

## 2020-03-30 ENCOUNTER — Other Ambulatory Visit: Payer: Self-pay

## 2020-03-30 DIAGNOSIS — S32050A Wedge compression fracture of fifth lumbar vertebra, initial encounter for closed fracture: Secondary | ICD-10-CM | POA: Insufficient documentation

## 2020-03-30 DIAGNOSIS — M545 Low back pain, unspecified: Secondary | ICD-10-CM

## 2020-03-30 DIAGNOSIS — S32010A Wedge compression fracture of first lumbar vertebra, initial encounter for closed fracture: Secondary | ICD-10-CM

## 2020-03-30 DIAGNOSIS — S22080A Wedge compression fracture of T11-T12 vertebra, initial encounter for closed fracture: Secondary | ICD-10-CM | POA: Diagnosis present

## 2020-03-30 DIAGNOSIS — G8929 Other chronic pain: Secondary | ICD-10-CM | POA: Insufficient documentation

## 2020-08-03 ENCOUNTER — Other Ambulatory Visit: Payer: Self-pay | Admitting: Family Medicine

## 2020-08-03 DIAGNOSIS — Z1231 Encounter for screening mammogram for malignant neoplasm of breast: Secondary | ICD-10-CM

## 2020-09-01 ENCOUNTER — Other Ambulatory Visit: Payer: Self-pay

## 2020-09-01 ENCOUNTER — Ambulatory Visit
Admission: RE | Admit: 2020-09-01 | Discharge: 2020-09-01 | Disposition: A | Discharge status: Home / Self Care | Payer: Medicare HMO | Source: Ambulatory Visit | Attending: Family Medicine | Admitting: Family Medicine

## 2020-09-01 DIAGNOSIS — Z1231 Encounter for screening mammogram for malignant neoplasm of breast: Secondary | ICD-10-CM | POA: Insufficient documentation

## 2020-11-25 ENCOUNTER — Emergency Department
Admission: EM | Admit: 2020-11-25 | Discharge: 2020-11-25 | Disposition: A | Discharge status: Home / Self Care | Payer: Medicare HMO | Attending: Emergency Medicine | Admitting: Emergency Medicine

## 2020-11-25 ENCOUNTER — Other Ambulatory Visit: Payer: Self-pay

## 2020-11-25 ENCOUNTER — Encounter: Payer: Self-pay | Admitting: Emergency Medicine

## 2020-11-25 DIAGNOSIS — J441 Chronic obstructive pulmonary disease with (acute) exacerbation: Secondary | ICD-10-CM | POA: Insufficient documentation

## 2020-11-25 DIAGNOSIS — R0602 Shortness of breath: Secondary | ICD-10-CM | POA: Diagnosis present

## 2020-11-25 DIAGNOSIS — F17210 Nicotine dependence, cigarettes, uncomplicated: Secondary | ICD-10-CM | POA: Diagnosis not present

## 2020-11-25 DIAGNOSIS — Z79899 Other long term (current) drug therapy: Secondary | ICD-10-CM | POA: Diagnosis not present

## 2020-11-25 DIAGNOSIS — I10 Essential (primary) hypertension: Secondary | ICD-10-CM | POA: Diagnosis not present

## 2020-11-25 LAB — BASIC METABOLIC PANEL
Anion gap: 12 (ref 5–15)
BUN: 20 mg/dL (ref 8–23)
CO2: 22 mmol/L (ref 22–32)
Calcium: 8.7 mg/dL — ABNORMAL LOW (ref 8.9–10.3)
Chloride: 106 mmol/L (ref 98–111)
Creatinine, Ser: 0.72 mg/dL (ref 0.44–1.00)
GFR, Estimated: >60 mL/min (ref >60)
Glucose, Bld: 95 mg/dL (ref 70–99)
Potassium: 3.2 mmol/L — ABNORMAL LOW (ref 3.5–5.1)
Sodium: 140 mmol/L (ref 135–145)

## 2020-11-25 LAB — CBC
HCT: 37.8 % (ref 36.0–46.0)
Hemoglobin: 12.4 g/dL (ref 12.0–15.0)
MCH: 32 pg (ref 26.0–34.0)
MCHC: 32.8 g/dL (ref 30.0–36.0)
MCV: 97.7 fL (ref 80.0–100.0)
Platelets: 273 10*3/uL (ref 150–400)
RBC: 3.87 MIL/uL (ref 3.87–5.11)
RDW: 16.1 % — ABNORMAL HIGH (ref 11.5–15.5)
WBC: 13 10*3/uL — ABNORMAL HIGH (ref 4.0–10.5)
nRBC: 0 % (ref 0.0–0.2)

## 2020-11-25 LAB — TROPONIN I (HIGH SENSITIVITY)
Troponin I (High Sensitivity): 10 ng/L (ref <18)
Troponin I (High Sensitivity): 10 ng/L (ref <18)

## 2020-11-25 MED ORDER — METHYLPREDNISOLONE SODIUM SUCC 125 MG IJ SOLR
125.0000 mg | Freq: Once | INTRAMUSCULAR | Status: AC
  Administered 2020-11-25: 125 mg via INTRAVENOUS
  Filled 2020-11-25: qty 2

## 2020-11-25 MED ORDER — PREDNISONE 10 MG (21) PO TBPK: ORAL_TABLET | ORAL | 0 refills | Status: DC

## 2020-11-25 MED ORDER — FUROSEMIDE 20 MG PO TABS: 20.0000 mg | ORAL_TABLET | Freq: Every day | ORAL | 0 refills | Status: DC

## 2020-11-25 NOTE — ED Notes (Signed)
CRITICAL LAB: SODIUM is 115, Lab, Dr. Derrill Kay notified, orders received

## 2020-11-25 NOTE — ED Triage Notes (Signed)
Pt to ED via POV c/o shortness of breath x 1 month and Htn. Pt states that she was given med by her PCP for shortness of breath and it got better for a few days but then she got worse again. Pt states that she takes metoprolol for her blood pressure and it has been controlled until recently. Pt is in NAD at this time.

## 2020-11-25 NOTE — ED Notes (Signed)
Discharge intructions reviewed with Patient. Pt calm , collective, denied pain or sob

## 2020-11-25 NOTE — ED Provider Notes (Signed)
[facility] Emergency Department Provider Note  ____________________________________________   I have reviewed the triage vital signs and the nursing notes.   HISTORY  Chief Complaint Shortness of Breath, Hypertension, and Chest Pain   History limited by: Not Limited   HPI Krystal Lee is a 65 y.o. female who presents to the emergency department today because of concern for continued shortness of breath and high blood pressure. Patient states her symptoms have been present for the past month or so. They have gotten worse in the past week. Went to her PCP roughly 2 weeks ago and was treated with course of antibiotics and steroids. Went to [facility] ED yesterday for the same complaint. States she was has yet to pick up the steroids prescribed bu [facility] because her pharmacy had run out of them.   Records reviewed. Per medical record review patient has a history of COPD, GERD, HTN. [facility] ED visit yesterday.   Past Medical History:  Diagnosis Date  . Anxiety   . Arthritis    knees  . Bipolar 1 disorder ([place])   . COPD (chronic obstructive pulmonary disease) ([place])   . Depression   . GERD (gastroesophageal reflux disease)   . Headache    during allergy season  . Hepatitis C virus   . Hypertension   . Post-menopausal   . Schizoaffective disorder [facility])     Patient Active Problem List   Diagnosis Date Noted  . COPD exacerbation ([place]) 07/28/2018  . Acute on chronic respiratory failure with hypoxia and hypercapnia ([place]) 09/16/2017  . COPD with acute exacerbation ([place]) 09/16/2017    Past Surgical History:  Procedure Laterality Date  . CATARACT EXTRACTION  2010   right   . CATARACT EXTRACTION W/PHACO Left 06/09/2019   Procedure: CATARACT EXTRACTION PHACO AND INTRAOCULAR LENS PLACEMENT ([place]) COMPLICATED  LEFT;  Surgeon: Birder Robson, MD;  Location: [facility];  Service: Ophthalmology;  Laterality: Left;  VISION BLUE  [place]  . LOWER EXTREMITY  ANGIOGRAPHY Bilateral 08/31/2013   [facility], Dr. Lucky Cowboy    Prior to Admission medications   Medication Sig Start Date End Date Taking? Authorizing Provider  albuterol (PROVENTIL HFA;VENTOLIN HFA) 108 (90 Base) MCG/ACT inhaler Inhale 1-2 puffs into the lungs every 6 (six) hours as needed for wheezing or shortness of breath.    [provider]  alendronate (FOSAMAX) 70 MG tablet Take 70 mg by mouth every Saturday.     [provider]  ALPRAZolam [name]) 0.5 MG tablet Take 0.5 mg by mouth 2 (two) times daily as needed. 10/29/19   [provider]  chlorpheniramine-HYDROcodone (TUSSIONEX PENNKINETIC ER) 10-8 MG/5ML SUER Take 5 mLs by mouth 2 (two) times daily. 02/25/20   Earleen Newport, MD  clotrimazole (MYCELEX) 10 MG troche Take 10 mg by mouth 5 (five) times daily.    [provider]  cyclobenzaprine (FLEXERIL) 10 MG tablet Take 10 mg by mouth daily as needed for muscle spasms.     [provider]  estrogens, conjugated, (PREMARIN) 0.3 MG tablet Take 0.3 mg by mouth daily. Take daily for 21 days then do not take for 7 days.    [provider]  ibuprofen (ADVIL,MOTRIN) 800 MG tablet Take 800 mg by mouth every 8 (eight) hours as needed for mild pain or moderate pain.     [provider]  medroxyPROGESTERone (PROVERA) 2.5 MG tablet Take 2.5 mg by mouth See admin instructions. Daily the last 5 days of each month  [provider]  metoprolol succinate (TOPROL-XL) 50 MG 24 hr tablet Take 50 mg by mouth daily.  08/22/17   [provider]  pantoprazole (PROTONIX) 40 MG tablet Take 40 mg by mouth daily. 08/17/17   [provider]  QUEtiapine (SEROQUEL) 200 MG tablet Take 200 mg by mouth at bedtime.  08/17/17   [provider]  roflumilast (DALIRESP) 500 MCG TABS tablet Take 500 mcg by mouth daily.    [provider]  rosuvastatin (CRESTOR) 40 MG tablet Take 40 mg by mouth at bedtime.    [provider]  TRELEGY ELLIPTA 100-62.5-25 MCG/INH AEPB Inhale 1 puff into the lungs daily. 12/27/18   [provider]    Allergies Patient has no known allergies.  Family History  Problem Relation Age of Onset  . Breast cancer Paternal Grandmother   . Bone cancer Paternal Grandmother   . Pancreatic cancer Mother   . Heart disease Father   . Heart disease Paternal Uncle   . Stroke Paternal Grandfather     Social History Social History   Tobacco Use  . Smoking status: Current Every Day Smoker    Packs/day: 1.00    Years: 45.00    Pack years: 45.00    Types: Cigarettes  . Smokeless tobacco: Never Used  . Tobacco comment: since age 82  Vaping Use  . Vaping Use: Never used  Substance Use Topics  . Alcohol use: Not Currently    Alcohol/week: 1.0 standard drink    Types: 1 Cans of beer per week  . Drug use: No    Review of Systems Constitutional: No fever/chills Eyes: No visual changes. ENT: No sore throat. Cardiovascular: Denies chest pain. Respiratory: Positive for shortness of breath. Gastrointestinal: No abdominal pain.  No nausea, no vomiting.  No diarrhea.   Genitourinary: Negative for dysuria. Musculoskeletal: Negative for back pain. Skin: Negative for rash. Neurological: Negative for headaches, focal weakness or numbness.  ____________________________________________   PHYSICAL EXAM:  VITAL SIGNS: ED Triage Vitals  Enc Vitals Group     BP 11/25/20 1847 (!) 191/92     Pulse Rate 11/25/20 1844 77     Resp 11/25/20 1844 16     Temp 11/25/20 1844 97.8 F (36.6 C)     Temp src --      SpO2 11/25/20 1844 94 %     Weight 11/25/20 1846 153 lb (69.4 kg)     Height 11/25/20 1846 4\' 11"  (1.499 m)     Head Circumference --      Peak Flow --      Pain Score 11/25/20 1845 10   Constitutional: Alert and oriented.  Eyes: Conjunctivae are normal.  ENT      Head: Normocephalic and atraumatic.      Nose: No congestion/rhinnorhea.      Mouth/Throat:  Mucous membranes are moist.      Neck: No stridor. Hematological/Lymphatic/Immunilogical: No cervical lymphadenopathy. Cardiovascular: Normal rate, regular rhythm.  No murmurs, rubs, or gallops.  Respiratory: Normal respiratory effort without tachypnea nor retractions. Diffuse expiratory wheezing. Gastrointestinal: Soft and non tender. No rebound. No guarding.  Genitourinary: Deferred Musculoskeletal: Normal range of motion in all extremities. No lower extremity edema. Neurologic:  Normal speech and language. No gross focal neurologic deficits are appreciated.  Skin:  Skin is warm, dry and intact. No rash noted. Psychiatric: Mood and affect are normal. Speech and behavior are normal. Patient exhibits appropriate insight and judgment.  ____________________________________________    LABS (pertinent  positives/negatives)  Trop hs 10 CBC wbc 13.0, hgb 12.4, plt 273 BMP na 115, k 3.0, glu 70, cr 0.75  ____________________________________________   EKG  I, Phineas Semen, attending physician, personally viewed and interpreted this EKG  EKG Time: 1842 Rate: 72 Rhythm: normal sinus rhythm Axis: normal Intervals: qtc 398 QRS: narrow ST changes: no st elevation Impression: normal ekg  ____________________________________________   PROCEDURES  Procedures  ____________________________________________   INITIAL IMPRESSION / ASSESSMENT AND PLAN / ED COURSE  Pertinent labs & imaging results that were available during my care of the patient were reviewed by me and considered in my medical decision making (see chart for details).   Patient presented to the emergency department today with concerns for continued shortness of breath.  Did have an evaluation for this 2 days ago at [facility] and that chart was reviewed.  Patient also had concerns about elevated blood pressure.  I had a discussion with patient about blood pressure management.  At this time do not feel like any acute organ  damages suffered.  Terms of patient's breathing will give patient paper prescription for steroids case her pharmacy continues to not have the steroids ordered by [facility].  Will also give patient a brief course of Lasix given elevated BNP from [facility]. ____________________________________________   FINAL CLINICAL IMPRESSION(S) / ED DIAGNOSES  Final diagnoses:  Shortness of breath  Hypertension, unspecified type     Note: This dictation was prepared with Dragon dictation. Any transcriptional errors that result from this process are unintentional     Phineas Semen, MD 11/25/20 2249

## 2020-11-25 NOTE — Discharge Instructions (Signed)
Please seek medical attention for any high fevers, chest pain, shortness of breath, change in behavior, persistent vomiting, bloody stool or any other new or concerning symptoms.  

## 2020-11-25 NOTE — ED Notes (Signed)
System down unable to sign

## 2020-11-25 NOTE — ED Notes (Signed)
Perlie Gold, RN called for pt in room, Nicki [place] EDT placing pt on monitor

## 2020-11-26 LAB — BASIC METABOLIC PANEL

## 2021-05-17 ENCOUNTER — Emergency Department: Payer: Medicare HMO

## 2021-05-17 ENCOUNTER — Other Ambulatory Visit: Payer: Self-pay

## 2021-05-17 DIAGNOSIS — Z20822 Contact with and (suspected) exposure to covid-19: Secondary | ICD-10-CM | POA: Diagnosis present

## 2021-05-17 DIAGNOSIS — J9611 Chronic respiratory failure with hypoxia: Secondary | ICD-10-CM | POA: Diagnosis present

## 2021-05-17 DIAGNOSIS — J441 Chronic obstructive pulmonary disease with (acute) exacerbation: Secondary | ICD-10-CM | POA: Diagnosis present

## 2021-05-17 DIAGNOSIS — A419 Sepsis, unspecified organism: Secondary | ICD-10-CM | POA: Diagnosis not present

## 2021-05-17 DIAGNOSIS — E785 Hyperlipidemia, unspecified: Secondary | ICD-10-CM | POA: Diagnosis present

## 2021-05-17 DIAGNOSIS — E669 Obesity, unspecified: Secondary | ICD-10-CM | POA: Diagnosis present

## 2021-05-17 DIAGNOSIS — F319 Bipolar disorder, unspecified: Secondary | ICD-10-CM | POA: Diagnosis present

## 2021-05-17 DIAGNOSIS — K219 Gastro-esophageal reflux disease without esophagitis: Secondary | ICD-10-CM | POA: Diagnosis present

## 2021-05-17 DIAGNOSIS — J209 Acute bronchitis, unspecified: Secondary | ICD-10-CM | POA: Diagnosis present

## 2021-05-17 DIAGNOSIS — J440 Chronic obstructive pulmonary disease with acute lower respiratory infection: Secondary | ICD-10-CM | POA: Diagnosis present

## 2021-05-17 DIAGNOSIS — F17210 Nicotine dependence, cigarettes, uncomplicated: Secondary | ICD-10-CM | POA: Diagnosis present

## 2021-05-17 DIAGNOSIS — Z9841 Cataract extraction status, right eye: Secondary | ICD-10-CM

## 2021-05-17 DIAGNOSIS — Z6830 Body mass index (BMI) 30.0-30.9, adult: Secondary | ICD-10-CM

## 2021-05-17 DIAGNOSIS — F419 Anxiety disorder, unspecified: Secondary | ICD-10-CM | POA: Diagnosis present

## 2021-05-17 DIAGNOSIS — I110 Hypertensive heart disease with heart failure: Secondary | ICD-10-CM | POA: Diagnosis present

## 2021-05-17 DIAGNOSIS — Z79899 Other long term (current) drug therapy: Secondary | ICD-10-CM

## 2021-05-17 DIAGNOSIS — Z961 Presence of intraocular lens: Secondary | ICD-10-CM | POA: Diagnosis present

## 2021-05-17 DIAGNOSIS — I5032 Chronic diastolic (congestive) heart failure: Secondary | ICD-10-CM | POA: Diagnosis present

## 2021-05-17 DIAGNOSIS — Z8249 Family history of ischemic heart disease and other diseases of the circulatory system: Secondary | ICD-10-CM

## 2021-05-17 DIAGNOSIS — F259 Schizoaffective disorder, unspecified: Secondary | ICD-10-CM | POA: Clinically undetermined

## 2021-05-17 DIAGNOSIS — Z823 Family history of stroke: Secondary | ICD-10-CM

## 2021-05-17 DIAGNOSIS — Z9842 Cataract extraction status, left eye: Secondary | ICD-10-CM

## 2021-05-17 DIAGNOSIS — Z803 Family history of malignant neoplasm of breast: Secondary | ICD-10-CM

## 2021-05-17 LAB — CBC WITH DIFFERENTIAL/PLATELET
Abs Immature Granulocytes: 0.03 10*3/uL (ref 0.00–0.07)
Basophils Absolute: 0.1 10*3/uL (ref 0.0–0.1)
Basophils Relative: 1 %
Eosinophils Absolute: 0.2 10*3/uL (ref 0.0–0.5)
Eosinophils Relative: 2 %
HCT: 37.2 % (ref 36.0–46.0)
Hemoglobin: 12.9 g/dL (ref 12.0–15.0)
Immature Granulocytes: 0 %
Lymphocytes Relative: 27 %
Lymphs Abs: 2.9 10*3/uL (ref 0.7–4.0)
MCH: 32.3 pg (ref 26.0–34.0)
MCHC: 34.7 g/dL (ref 30.0–36.0)
MCV: 93.2 fL (ref 80.0–100.0)
Monocytes Absolute: 0.7 10*3/uL (ref 0.1–1.0)
Monocytes Relative: 6 %
Neutro Abs: 7.1 10*3/uL (ref 1.7–7.7)
Neutrophils Relative %: 64 %
Platelets: 270 10*3/uL (ref 150–400)
RBC: 3.99 MIL/uL (ref 3.87–5.11)
RDW: 15.2 % (ref 11.5–15.5)
WBC: 11 10*3/uL — ABNORMAL HIGH (ref 4.0–10.5)
nRBC: 0 % (ref 0.0–0.2)

## 2021-05-17 MED ORDER — ALBUTEROL SULFATE HFA 108 (90 BASE) MCG/ACT IN AERS
2.0000 | INHALATION_SPRAY | RESPIRATORY_TRACT | Status: DC | PRN
  Filled 2021-05-17: qty 6.7

## 2021-05-17 NOTE — ED Triage Notes (Signed)
Pt states that she started taking 4 new medications today: lasix, verquvo, valsartan, carvedilol. Pt states she begabn to have sob and dizziness, pt denies chest pain.

## 2021-05-18 ENCOUNTER — Inpatient Hospital Stay
Admission: EM | Admit: 2021-05-18 | Discharge: 2021-05-22 | DRG: 872 | Disposition: A | Discharge status: Home / Self Care | Payer: Medicare HMO | Attending: Internal Medicine | Admitting: Internal Medicine

## 2021-05-18 DIAGNOSIS — J9611 Chronic respiratory failure with hypoxia: Secondary | ICD-10-CM | POA: Diagnosis present

## 2021-05-18 DIAGNOSIS — A419 Sepsis, unspecified organism: Secondary | ICD-10-CM | POA: Diagnosis present

## 2021-05-18 DIAGNOSIS — Z9842 Cataract extraction status, left eye: Secondary | ICD-10-CM | POA: Diagnosis not present

## 2021-05-18 DIAGNOSIS — F319 Bipolar disorder, unspecified: Secondary | ICD-10-CM | POA: Diagnosis present

## 2021-05-18 DIAGNOSIS — I10 Essential (primary) hypertension: Secondary | ICD-10-CM | POA: Diagnosis not present

## 2021-05-18 DIAGNOSIS — Z6830 Body mass index (BMI) 30.0-30.9, adult: Secondary | ICD-10-CM | POA: Diagnosis not present

## 2021-05-18 DIAGNOSIS — F419 Anxiety disorder, unspecified: Secondary | ICD-10-CM

## 2021-05-18 DIAGNOSIS — E785 Hyperlipidemia, unspecified: Secondary | ICD-10-CM | POA: Diagnosis present

## 2021-05-18 DIAGNOSIS — I110 Hypertensive heart disease with heart failure: Secondary | ICD-10-CM | POA: Diagnosis present

## 2021-05-18 DIAGNOSIS — Z803 Family history of malignant neoplasm of breast: Secondary | ICD-10-CM | POA: Diagnosis not present

## 2021-05-18 DIAGNOSIS — J440 Chronic obstructive pulmonary disease with acute lower respiratory infection: Secondary | ICD-10-CM | POA: Diagnosis present

## 2021-05-18 DIAGNOSIS — Z823 Family history of stroke: Secondary | ICD-10-CM | POA: Diagnosis not present

## 2021-05-18 DIAGNOSIS — K219 Gastro-esophageal reflux disease without esophagitis: Secondary | ICD-10-CM | POA: Diagnosis present

## 2021-05-18 DIAGNOSIS — Z720 Tobacco use: Secondary | ICD-10-CM | POA: Diagnosis not present

## 2021-05-18 DIAGNOSIS — J441 Chronic obstructive pulmonary disease with (acute) exacerbation: Secondary | ICD-10-CM | POA: Diagnosis present

## 2021-05-18 DIAGNOSIS — Z79899 Other long term (current) drug therapy: Secondary | ICD-10-CM | POA: Diagnosis not present

## 2021-05-18 DIAGNOSIS — I5032 Chronic diastolic (congestive) heart failure: Secondary | ICD-10-CM | POA: Diagnosis present

## 2021-05-18 DIAGNOSIS — Z9841 Cataract extraction status, right eye: Secondary | ICD-10-CM | POA: Diagnosis not present

## 2021-05-18 DIAGNOSIS — R0609 Other forms of dyspnea: Secondary | ICD-10-CM | POA: Diagnosis not present

## 2021-05-18 DIAGNOSIS — Z8249 Family history of ischemic heart disease and other diseases of the circulatory system: Secondary | ICD-10-CM | POA: Diagnosis not present

## 2021-05-18 DIAGNOSIS — F32A Depression, unspecified: Secondary | ICD-10-CM | POA: Diagnosis present

## 2021-05-18 DIAGNOSIS — Z961 Presence of intraocular lens: Secondary | ICD-10-CM | POA: Diagnosis present

## 2021-05-18 DIAGNOSIS — E669 Obesity, unspecified: Secondary | ICD-10-CM | POA: Diagnosis present

## 2021-05-18 DIAGNOSIS — I952 Hypotension due to drugs: Secondary | ICD-10-CM

## 2021-05-18 DIAGNOSIS — F259 Schizoaffective disorder, unspecified: Secondary | ICD-10-CM | POA: Diagnosis not present

## 2021-05-18 DIAGNOSIS — F17210 Nicotine dependence, cigarettes, uncomplicated: Secondary | ICD-10-CM | POA: Diagnosis present

## 2021-05-18 DIAGNOSIS — Z20822 Contact with and (suspected) exposure to covid-19: Secondary | ICD-10-CM | POA: Diagnosis present

## 2021-05-18 DIAGNOSIS — J209 Acute bronchitis, unspecified: Secondary | ICD-10-CM | POA: Diagnosis present

## 2021-05-18 LAB — RESP PANEL BY RT-PCR (FLU A&B, COVID) ARPGX2
Influenza A by PCR: NEGATIVE
Influenza B by PCR: NEGATIVE
SARS Coronavirus 2 by RT PCR: NEGATIVE

## 2021-05-18 LAB — BASIC METABOLIC PANEL
Anion gap: 10 (ref 5–15)
BUN: 13 mg/dL (ref 8–23)
CO2: 22 mmol/L (ref 22–32)
Calcium: 8.9 mg/dL (ref 8.9–10.3)
Chloride: 103 mmol/L (ref 98–111)
Creatinine, Ser: 0.99 mg/dL (ref 0.44–1.00)
GFR, Estimated: >60 mL/min (ref >60)
Glucose, Bld: 109 mg/dL — ABNORMAL HIGH (ref 70–99)
Potassium: 3.6 mmol/L (ref 3.5–5.1)
Sodium: 135 mmol/L (ref 135–145)

## 2021-05-18 LAB — LACTIC ACID, PLASMA: Lactic Acid, Venous: 1.9 mmol/L (ref 0.5–1.9)

## 2021-05-18 LAB — PROCALCITONIN: Procalcitonin: <0.1 ng/mL

## 2021-05-18 LAB — BRAIN NATRIURETIC PEPTIDE: B Natriuretic Peptide: 20.8 pg/mL (ref 0.0–100.0)

## 2021-05-18 LAB — TROPONIN I (HIGH SENSITIVITY): Troponin I (High Sensitivity): 6 ng/L (ref <18)

## 2021-05-18 MED ORDER — ALBUTEROL SULFATE (2.5 MG/3ML) 0.083% IN NEBU
5.0000 mg | INHALATION_SOLUTION | Freq: Once | RESPIRATORY_TRACT | Status: AC
  Administered 2021-05-18: 5 mg via RESPIRATORY_TRACT
  Filled 2021-05-18: qty 6

## 2021-05-18 MED ORDER — QUETIAPINE FUMARATE 300 MG PO TABS
300.0000 mg | ORAL_TABLET | Freq: Every day | ORAL | Status: DC
  Administered 2021-05-18 – 2021-05-21 (×4): 300 mg via ORAL
  Filled 2021-05-18 (×5): qty 1

## 2021-05-18 MED ORDER — AZITHROMYCIN 500 MG PO TABS
500.0000 mg | ORAL_TABLET | Freq: Every day | ORAL | Status: AC
  Administered 2021-05-18: 500 mg via ORAL
  Filled 2021-05-18: qty 1

## 2021-05-18 MED ORDER — MONTELUKAST SODIUM 10 MG PO TABS
10.0000 mg | ORAL_TABLET | Freq: Every day | ORAL | Status: DC
  Administered 2021-05-18 – 2021-05-22 (×5): 10 mg via ORAL
  Filled 2021-05-18 (×6): qty 1

## 2021-05-18 MED ORDER — FLUTICASONE FUROATE-VILANTEROL 100-25 MCG/INH IN AEPB
1.0000 | INHALATION_SPRAY | Freq: Every day | RESPIRATORY_TRACT | Status: DC
  Administered 2021-05-19 – 2021-05-22 (×4): 1 via RESPIRATORY_TRACT
  Filled 2021-05-18 (×2): qty 28

## 2021-05-18 MED ORDER — METHYLPREDNISOLONE SODIUM SUCC 125 MG IJ SOLR
125.0000 mg | Freq: Once | INTRAMUSCULAR | Status: AC
  Administered 2021-05-18: 125 mg via INTRAVENOUS
  Filled 2021-05-18: qty 2

## 2021-05-18 MED ORDER — IPRATROPIUM-ALBUTEROL 0.5-2.5 (3) MG/3ML IN SOLN
3.0000 mL | RESPIRATORY_TRACT | Status: AC
  Administered 2021-05-18 (×3): 3 mL via RESPIRATORY_TRACT
  Filled 2021-05-18: qty 6
  Filled 2021-05-18: qty 3

## 2021-05-18 MED ORDER — ROFLUMILAST 500 MCG PO TABS
500.0000 ug | ORAL_TABLET | Freq: Every day | ORAL | Status: DC
  Administered 2021-05-18 – 2021-05-22 (×5): 500 ug via ORAL
  Filled 2021-05-18 (×6): qty 1

## 2021-05-18 MED ORDER — SODIUM CHLORIDE 0.9 % IV SOLN: INTRAVENOUS | Status: AC

## 2021-05-18 MED ORDER — PROGESTERONE MICRONIZED 100 MG PO CAPS
100.0000 mg | ORAL_CAPSULE | Freq: Every day | ORAL | Status: DC
  Administered 2021-05-19 – 2021-05-21 (×3): 100 mg via ORAL
  Filled 2021-05-18 (×4): qty 1

## 2021-05-18 MED ORDER — DM-GUAIFENESIN ER 30-600 MG PO TB12: 1.0000 | ORAL_TABLET | Freq: Two times a day (BID) | ORAL | Status: DC | PRN

## 2021-05-18 MED ORDER — UMECLIDINIUM BROMIDE 62.5 MCG/INH IN AEPB
1.0000 | INHALATION_SPRAY | Freq: Every day | RESPIRATORY_TRACT | Status: DC
  Administered 2021-05-19: 1 via RESPIRATORY_TRACT
  Filled 2021-05-18 (×2): qty 7

## 2021-05-18 MED ORDER — HYDRALAZINE HCL 20 MG/ML IJ SOLN
5.0000 mg | INTRAMUSCULAR | Status: DC | PRN
  Administered 2021-05-20 (×2): 5 mg via INTRAVENOUS
  Filled 2021-05-18 (×2): qty 1

## 2021-05-18 MED ORDER — ALBUTEROL SULFATE (2.5 MG/3ML) 0.083% IN NEBU: 2.5000 mg | INHALATION_SOLUTION | RESPIRATORY_TRACT | Status: DC | PRN

## 2021-05-18 MED ORDER — ONDANSETRON HCL 4 MG/2ML IJ SOLN: 4.0000 mg | Freq: Three times a day (TID) | INTRAMUSCULAR | Status: DC | PRN

## 2021-05-18 MED ORDER — ENOXAPARIN SODIUM 40 MG/0.4ML IJ SOSY
40.0000 mg | PREFILLED_SYRINGE | INTRAMUSCULAR | Status: DC
  Administered 2021-05-18 – 2021-05-21 (×4): 40 mg via SUBCUTANEOUS
  Filled 2021-05-18 (×4): qty 0.4

## 2021-05-18 MED ORDER — NICOTINE 21 MG/24HR TD PT24
21.0000 mg | MEDICATED_PATCH | Freq: Every day | TRANSDERMAL | Status: DC
  Administered 2021-05-18 – 2021-05-22 (×5): 21 mg via TRANSDERMAL
  Filled 2021-05-18 (×5): qty 1

## 2021-05-18 MED ORDER — AZITHROMYCIN 500 MG PO TABS
250.0000 mg | ORAL_TABLET | Freq: Every day | ORAL | Status: AC
  Administered 2021-05-19 – 2021-05-22 (×4): 250 mg via ORAL
  Filled 2021-05-18 (×4): qty 1

## 2021-05-18 MED ORDER — CARVEDILOL 6.25 MG PO TABS
6.2500 mg | ORAL_TABLET | Freq: Every day | ORAL | Status: DC
  Administered 2021-05-18 – 2021-05-21 (×4): 6.25 mg via ORAL
  Filled 2021-05-18 (×4): qty 1

## 2021-05-18 MED ORDER — ALPRAZOLAM 0.5 MG PO TABS
0.5000 mg | ORAL_TABLET | Freq: Three times a day (TID) | ORAL | Status: DC | PRN
  Administered 2021-05-18 – 2021-05-22 (×11): 0.5 mg via ORAL
  Filled 2021-05-18 (×11): qty 1

## 2021-05-18 MED ORDER — IPRATROPIUM-ALBUTEROL 0.5-2.5 (3) MG/3ML IN SOLN
3.0000 mL | RESPIRATORY_TRACT | Status: DC
  Administered 2021-05-18 – 2021-05-19 (×9): 3 mL via RESPIRATORY_TRACT
  Filled 2021-05-18 (×8): qty 3

## 2021-05-18 MED ORDER — METHYLPREDNISOLONE SODIUM SUCC 40 MG IJ SOLR
40.0000 mg | Freq: Two times a day (BID) | INTRAMUSCULAR | Status: DC
  Administered 2021-05-18 – 2021-05-21 (×6): 40 mg via INTRAVENOUS
  Filled 2021-05-18 (×6): qty 1

## 2021-05-18 MED ORDER — ROSUVASTATIN CALCIUM 20 MG PO TABS
40.0000 mg | ORAL_TABLET | Freq: Every day | ORAL | Status: DC
  Administered 2021-05-18 – 2021-05-21 (×4): 40 mg via ORAL
  Filled 2021-05-18 (×6): qty 2

## 2021-05-18 MED ORDER — SODIUM CHLORIDE 0.9 % IV BOLUS (SEPSIS)
1000.0000 mL | Freq: Once | INTRAVENOUS | Status: AC
  Administered 2021-05-18: 1000 mL via INTRAVENOUS

## 2021-05-18 MED ORDER — ACETAMINOPHEN 325 MG PO TABS: 650.0000 mg | ORAL_TABLET | Freq: Four times a day (QID) | ORAL | Status: DC | PRN

## 2021-05-18 MED ORDER — PANTOPRAZOLE SODIUM 40 MG PO TBEC
40.0000 mg | DELAYED_RELEASE_TABLET | Freq: Two times a day (BID) | ORAL | Status: DC
  Administered 2021-05-18 – 2021-05-22 (×9): 40 mg via ORAL
  Filled 2021-05-18 (×9): qty 1

## 2021-05-18 MED ORDER — FLUTICASONE-UMECLIDIN-VILANT 100-62.5-25 MCG/INH IN AEPB: 1.0000 | INHALATION_SPRAY | Freq: Every day | RESPIRATORY_TRACT | Status: DC

## 2021-05-18 NOTE — H&P (Signed)
History and Physical    Krystal Lee ZOX:[id number] DOB: 01-19-1956 DOA: 05/18/2021  Referring MD/NP/PA:   PCP: Armando Gang, FNP   Patient coming from:  The patient is coming from home.  At baseline, pt is independent for most of ADL.        Chief Complaint: SOB  HPI: Krystal Lee is a 65 y.o. female with medical history significant of hypertension, hyperlipidemia, COPD on 2.5 L oxygen, GERD, anxiety, bipolar, schizoaffective disorder, HCV, who presents with shortness of breath.  She states that she has been having shortness breath for more one month, which has progressively worsened in the past several days.  She has cough with white mucus production.  She has chest tightness, but no chest pain.  No fever or chills.  Denies nausea vomiting, diarrhea or abdominal pain.  No symptoms of UTI.  Patient states that her blood pressure was recently elevated.  Her blood pressure medication was adjusted by her doctor. She is taking multiple blood pressure medications, including Lasix, Verquvo, valsartan and Coreg. Her valsartan dose was increased from 40 mg twice daily to 80 mg twice daily.She developed hypotension with blood pressure down to 80s, causing lightheadedness.  No unilateral numbness or tingling in extremities.  No facial droop or slurred speech  ED Course: pt was found to have WBC 11.0, troponin level 6, BNP 20.8, negative COVID PCR, electrolytes renal function okay, temperature normal, blood pressure 94/58, 138/85, heart rate 107, RR 21, oxygen saturation 95% on home level 2.5 L oxygen.  Chest x-ray negative.  Patient is admitted to MedSurg bed as inpatient.  Review of Systems:   General: no fevers, chills, no body weight gain, has fatigue HEENT: no blurry vision, hearing changes or sore throat Respiratory: has dyspnea, coughing, wheezing CV: no chest pain, no palpitations GI: no nausea, vomiting, abdominal pain, diarrhea, constipation GU: no dysuria, burning on  urination, increased urinary frequency, hematuria  Ext: no leg edema Neuro: no unilateral weakness, numbness, or tingling, no vision change or hearing loss. has lightheadedness Skin: no rash, no skin tear. MSK: No muscle spasm, no deformity, no limitation of range of movement in spin Heme: No easy bruising.  Travel history: No recent long distant travel.  Allergy: No Known Allergies  Past Medical History:  Diagnosis Date   Anxiety    Arthritis    knees   Bipolar 1 disorder (HCC)    COPD (chronic obstructive pulmonary disease) (HCC)    Depression    GERD (gastroesophageal reflux disease)    Headache    during allergy season   Hepatitis C virus    Hypertension    Post-menopausal    Schizoaffective disorder [facility])     Past Surgical History:  Procedure Laterality Date   CATARACT EXTRACTION  2010   right    CATARACT EXTRACTION W/PHACO Left 06/09/2019   Procedure: CATARACT EXTRACTION PHACO AND INTRAOCULAR LENS PLACEMENT ([facility]) COMPLICATED  LEFT;  Surgeon: [name], MD;  [facility]: [facility];  Service: Ophthalmology;  Laterality: Left;  VISION BLUE  [place]   LOWER EXTREMITY ANGIOGRAPHY Bilateral 08/31/2013   [facility], Dr. [name]    Social History:  reports that she has been smoking cigarettes. She has a 45.00 pack-year smoking history. She has never used smokeless tobacco. She reports previous alcohol use of about 1.0 standard drink of alcohol per week. She reports that she does not use drugs.  Family History:  Family History  Problem Relation Age of Onset   Breast  cancer Paternal Grandmother    Bone cancer Paternal Grandmother    Pancreatic cancer Mother    Heart disease Father    Heart disease Paternal Uncle    Stroke Paternal Grandfather      Prior to Admission medications   Medication Sig Start Date End Date Taking? Authorizing Provider  albuterol (PROVENTIL HFA;VENTOLIN HFA) 108 (90 Base) MCG/ACT inhaler Inhale 1-2 puffs into the lungs every 6 (six) hours  as needed for wheezing or shortness of breath.    [provider]  alendronate (FOSAMAX) 70 MG tablet Take 70 mg by mouth every Saturday.     [provider]  ALPRAZolam Prudy Feeler(XANAX) 0.5 MG tablet Take 0.5 mg by mouth 2 (two) times daily as needed. 10/29/19   [provider]  chlorpheniramine-HYDROcodone (TUSSIONEX PENNKINETIC ER) 10-8 MG/5ML SUER Take 5 mLs by mouth 2 (two) times daily. 02/25/20   Emily FilbertWilliams, Jonathan E, MD  clotrimazole (MYCELEX) 10 MG troche Take 10 mg by mouth 5 (five) times daily.    [provider]  cyclobenzaprine (FLEXERIL) 10 MG tablet Take 10 mg by mouth daily as needed for muscle spasms.     [provider]  estrogens, conjugated, (PREMARIN) 0.3 MG tablet Take 0.3 mg by mouth daily. Take daily for 21 days then do not take for 7 days.    [provider]  furosemide (LASIX) 20 MG tablet Take 1 tablet (20 mg total) by mouth daily for 4 days. 11/25/20 11/29/20  Phineas SemenGoodman, Graydon, MD  ibuprofen (ADVIL,MOTRIN) 800 MG tablet Take 800 mg by mouth every 8 (eight) hours as needed for mild pain or moderate pain.     [provider]  medroxyPROGESTERone (PROVERA) 2.5 MG tablet Take 2.5 mg by mouth See admin instructions. Daily the last 5 days of each month    [provider]  metoprolol succinate (TOPROL-XL) 50 MG 24 hr tablet Take 50 mg by mouth daily.  08/22/17   [provider]  pantoprazole (PROTONIX) 40 MG tablet Take 40 mg by mouth daily. 08/17/17   [provider]  predniSONE (STERAPRED UNI-PAK 21 TAB) 10 MG (21) TBPK tablet Per packaging instructions 11/25/20   Phineas SemenGoodman, Graydon, MD  QUEtiapine (SEROQUEL) 200 MG tablet Take 200 mg by mouth at bedtime.  08/17/17   [provider]  roflumilast (DALIRESP) 500 MCG TABS tablet Take 500 mcg by mouth daily.    [provider]  rosuvastatin (CRESTOR) 40 MG tablet Take 40 mg by mouth at bedtime.    [provider]  TRELEGY ELLIPTA  100-62.5-25 MCG/INH AEPB Inhale 1 puff into the lungs daily. 12/27/18   [provider]    Physical Exam: Vitals:   05/18/21 0554 05/18/21 0614 05/18/21 0700 05/18/21 0800  BP: (!) 94/58 114/62 136/76 138/85  Pulse: 86 83 88 92  Resp: 18 20 (!) 21 20  Temp:      TempSrc:      SpO2: 100% 98% 98% 95%   General: Not in acute distress HEENT:       Eyes: PERRL, EOMI, no scleral icterus.       ENT: No discharge from the ears and nose, no pharynx injection, no tonsillar enlargement.        Neck: No JVD, no bruit, no mass felt. Heme: No neck lymph node enlargement. Cardiac: S1/S2, RRR, No murmurs, No gallops or rubs. Respiratory: Has wheezing and rhonchi bilaterally GI: Soft, nondistended, nontender, no rebound pain, no organomegaly, BS present. GU: No hematuria Ext: No pitting  leg edema bilaterally. 1+DP/PT pulse bilaterally. Musculoskeletal: No joint deformities, No joint redness or warmth, no limitation of ROM in spin. Skin: No rashes.  Neuro: Alert, oriented X3, cranial nerves II-XII grossly intact, moves all extremities normally. Psych: Patient is not psychotic, no suicidal or hemocidal ideation.  Labs on Admission: I have personally reviewed following labs and imaging studies  CBC: Recent Labs  Lab 05/17/21 2312  WBC 11.0*  NEUTROABS 7.1  HGB 12.9  HCT 37.2  MCV 93.2  PLT 270   Basic Metabolic Panel: Recent Labs  Lab 05/17/21 2312  NA 135  K 3.6  CL 103  CO2 22  GLUCOSE 109*  BUN 13  CREATININE 0.99  CALCIUM 8.9   GFR: CrCl cannot be calculated (Unknown ideal weight.). Liver Function Tests: No results for input(s): AST, ALT, ALKPHOS, BILITOT, PROT, ALBUMIN in the last 168 hours. No results for input(s): LIPASE, AMYLASE in the last 168 hours. No results for input(s): AMMONIA in the last 168 hours. Coagulation Profile: No results for input(s): INR, PROTIME in the last 168 hours. Cardiac Enzymes: No results for input(s): CKTOTAL, CKMB, CKMBINDEX,  TROPONINI in the last 168 hours. BNP (last 3 results) No results for input(s): PROBNP in the last 8760 hours. HbA1C: No results for input(s): HGBA1C in the last 72 hours. CBG: No results for input(s): GLUCAP in the last 168 hours. Lipid Profile: No results for input(s): CHOL, HDL, LDLCALC, TRIG, CHOLHDL, LDLDIRECT in the last 72 hours. Thyroid Function Tests: No results for input(s): TSH, T4TOTAL, FREET4, T3FREE, THYROIDAB in the last 72 hours. Anemia Panel: No results for input(s): VITAMINB12, FOLATE, FERRITIN, TIBC, IRON, RETICCTPCT in the last 72 hours. Urine analysis:    Component Value Date/Time   COLORURINE STRAW (A) 03/17/2018 0055   APPEARANCEUR CLEAR (A) 03/17/2018 0055   APPEARANCEUR Clear 10/29/2013 2004   LABSPEC 1.004 (L) 03/17/2018 0055   LABSPEC 1.004 10/29/2013 2004   PHURINE 6.0 03/17/2018 0055   GLUCOSEU NEGATIVE 03/17/2018 0055   GLUCOSEU Negative 10/29/2013 2004   HGBUR NEGATIVE 03/17/2018 0055   BILIRUBINUR NEGATIVE 03/17/2018 0055   BILIRUBINUR Negative 10/29/2013 2004   KETONESUR NEGATIVE 03/17/2018 0055   PROTEINUR NEGATIVE 03/17/2018 0055   UROBILINOGEN 1.0 11/07/2013 1053   NITRITE NEGATIVE 03/17/2018 0055   LEUKOCYTESUR NEGATIVE 03/17/2018 0055   LEUKOCYTESUR Negative 10/29/2013 2004   Sepsis Labs: @LABRCNTIP (procalcitonin:4,lacticidven:4) ) Recent Results (from the past 240 hour(s))  Resp Panel by RT-PCR (Flu A&B, Covid) Nasopharyngeal Swab     Status: None   Collection Time: 05/18/21  6:16 AM   Specimen: Nasopharyngeal Swab; Nasopharyngeal(NP) swabs in vial transport medium  Result Value Ref Range Status   SARS Coronavirus 2 by RT PCR NEGATIVE NEGATIVE Final    Comment: (NOTE) SARS-CoV-2 target nucleic acids are NOT DETECTED.  The SARS-CoV-2 RNA is generally detectable in upper respiratory specimens during the acute phase of infection. The lowest concentration of SARS-CoV-2 viral copies this assay can detect is 138 copies/mL. A negative  result does not preclude SARS-Cov-2 infection and should not be used as the sole basis for treatment or other patient management decisions. A negative result may occur with  improper specimen collection/handling, submission of specimen other than nasopharyngeal swab, presence of viral mutation(s) within the areas targeted by this assay, and inadequate number of viral copies(<138 copies/mL). A negative result must be combined with clinical observations, patient history, and epidemiological information. The expected result is Negative.  Fact Sheet for Patients:  05/20/21  Fact Sheet for Healthcare Providers:  SeriousBroker.it  This test is no t yet approved or cleared by the [place] and  has been authorized for detection and/or diagnosis of SARS-CoV-2 by FDA under an Emergency Use Authorization (EUA). This EUA will remain  in effect (meaning this test can be used) for the duration of the COVID-19 declaration under Section 564(b)(1) of the Act, 21 U.S.C.section 360bbb-3(b)(1), unless the authorization is terminated  or revoked sooner.       Influenza A by PCR NEGATIVE NEGATIVE Final   Influenza B by PCR NEGATIVE NEGATIVE Final    Comment: (NOTE) The Xpert Xpress SARS-CoV-2/FLU/RSV plus assay is intended as an aid in the diagnosis of influenza from Nasopharyngeal swab specimens and should not be used as a sole basis for treatment. Nasal washings and aspirates are unacceptable for Xpert Xpress SARS-CoV-2/FLU/RSV testing.  Fact Sheet for Patients: BloggerCourse.com  Fact Sheet for Healthcare Providers: SeriousBroker.it  This test is not yet approved or cleared by the [place] FDA and has been authorized for detection and/or diagnosis of SARS-CoV-2 by FDA under an Emergency Use Authorization (EUA). This EUA will remain in effect (meaning this test can be used)  for the duration of the COVID-19 declaration under Section 564(b)(1) of the Act, 21 U.S.C. section 360bbb-3(b)(1), unless the authorization is terminated or revoked.  Performed at [facility], [address] [place], [place] [address]      Radiological Exams on Admission: DG Chest 2 View  Result Date: 05/17/2021 CLINICAL DATA:  Shortness of breath EXAM: CHEST - 2 VIEW COMPARISON:  02/25/2020 FINDINGS: Heart and mediastinal contours are within normal limits. No focal opacities or effusions. No acute bony abnormality. IMPRESSION: No active cardiopulmonary disease. Electronically Signed   By: Charlett Nose M.D.   On: 05/17/2021 23:28     EKG: I have personally reviewed.  Sinus rhythm, QTC 422, low voltage, nonspecific T wave change  Assessment/Plan Principal Problem:   COPD exacerbation (HCC) Active Problems:   Bipolar 1 disorder (HCC)   Depression   Anxiety   Hypertension   Sepsis (HCC)   HLD (hyperlipidemia)   GERD (gastroesophageal reflux disease)   Tobacco abuse   COPD exacerbation (HCC): Currently patient has oxygen saturation 95% on home 2.5 L of oxygen.  Chest x-ray negative for infiltration. - will admit to med-surg bed as inpatient -Bronchodilators -singular -Solu-Medrol 40 mg IV bid -Z pak  -Mucinex for cough  -Incentive spirometry -Follow up sputum culture -Nasal cannula oxygen as needed to maintain O2 saturation 93% or greater  Sepsis due to COPD exacerbation: Patient meets criteria for sepsis with tachycardia with heart rate of 107, tachypnea with RR 21.  Pending lactic acid level.  No fever.  Has mild leukocytosis with WBC 11.0. --will get Procalcitonin and trend lactic acid levels per sepsis protocol. -IVF: 1L of NS bolus in ED, followed by 75 cc/h   Bipolar 1 disorder, depression and anxiety -Continue home medications  Hypertension: Patient had hypotension at home -Hold Lasix, Verquvo and valsartan -Continue home Coreg -IV  hydralazine.  HLD (hyperlipidemia) -crestor  GERD (gastroesophageal reflux disease) -Protonix  Tobacco abuse -Did counseling about importance of quitting smoking -Nicotine patch       DVT ppx: SQ Lovenox Code Status: Full code (patient had DNR in the past, but today she wants to be full code after discussion) Family Communication: not done, no family member is at bed side.      Disposition Plan:  Anticipate discharge back to previous environment Consults called:  none Admission status and Level of care: Med-Surg:     as inpt     Status is: Inpatient  Remains inpatient appropriate because:Inpatient level of care appropriate due to severity of illness  Dispo: The patient is from: Home              Anticipated d/c is to: Home              Patient currently is not medically stable to d/c.   Difficult to place patient No          Date of Service 05/18/2021    Lorretta Harp [facility]   If 7PM-7AM, please contact night-coverage [email or web address] 05/18/2021, 9:38 AM

## 2021-05-18 NOTE — ED Provider Notes (Signed)
[facility] Emergency Department Provider Note  ____________________________________________   Event Date/Time   First MD Initiated Contact with Patient 05/18/21 [phone]     (approximate)  I have reviewed the triage vital signs and the nursing notes.   HISTORY  Chief Complaint Shortness of Breath    HPI Krystal Lee is a 65 y.o. female with history of COPD on 2-1/2 L, hypertension, bipolar disorder, schizoaffective disorder, hepatitis C who presents to the emergency department with concerns for lightheadedness and feeling like she was going to pass out today.  Symptoms worse with standing.  States she just saw her primary care doctor and was just started on lasix, verquvo, valsartan, carvedilol about a week ago.  States yesterday her dose of valsartan was increased from 40 mg twice daily to 60 mg twice daily.  She reports she has noted episodes of hypotension with systolic blood pressure as low as the 80s.  She states however when she was in her doctor's office she was tachycardic in the 130s and hypertensive in the 180s and this is why her medications were increased.  She states that she is supposed to take valsartan twice a day but only took it once this morning.  She denies any fevers, chest pain, vomiting, diarrhea, bloody stool or melena.  She does report increasing shortness of breath from her baseline.  She is still smoking.        Past Medical History:  Diagnosis Date   Anxiety    Arthritis    knees   Bipolar 1 disorder (HCC)    COPD (chronic obstructive pulmonary disease) (HCC)    Depression    GERD (gastroesophageal reflux disease)    Headache    during allergy season   Hepatitis C virus    Hypertension    Post-menopausal    Schizoaffective disorder [facility])     Patient Active Problem List   Diagnosis Date Noted   COPD exacerbation (HCC) 07/28/2018   Acute on chronic respiratory failure with hypoxia and hypercapnia (HCC) 09/16/2017    COPD with acute exacerbation (HCC) 09/16/2017    Past Surgical History:  Procedure Laterality Date   CATARACT EXTRACTION  2010   right    CATARACT EXTRACTION W/PHACO Left 06/09/2019   Procedure: CATARACT EXTRACTION PHACO AND INTRAOCULAR LENS PLACEMENT (IOC) COMPLICATED  LEFT;  Surgeon: [name], MD;  Location: [facility];  Service: Ophthalmology;  Laterality: Left;  VISION BLUE  [place]   LOWER EXTREMITY ANGIOGRAPHY Bilateral 08/31/2013   [facility], Dr. [name]    Prior to Admission medications   Medication Sig Start Date End Date Taking? Authorizing Provider  albuterol (PROVENTIL HFA;VENTOLIN HFA) 108 (90 Base) MCG/ACT inhaler Inhale 1-2 puffs into the lungs every 6 (six) hours as needed for wheezing or shortness of breath.    [provider]  alendronate (FOSAMAX) 70 MG tablet Take 70 mg by mouth every Saturday.     [provider]  ALPRAZolam Prudy Feeler) 0.5 MG tablet Take 0.5 mg by mouth 2 (two) times daily as needed. 10/29/19   [provider]  chlorpheniramine-HYDROcodone (TUSSIONEX PENNKINETIC ER) 10-8 MG/5ML SUER Take 5 mLs by mouth 2 (two) times daily. 02/25/20   Emily Filbert, MD  clotrimazole (MYCELEX) 10 MG troche Take 10 mg by mouth 5 (five) times daily.    [provider]  cyclobenzaprine (FLEXERIL) 10 MG tablet Take 10 mg by mouth daily as needed for muscle spasms.     [provider]  estrogens,  conjugated, (PREMARIN) 0.3 MG tablet Take 0.3 mg by mouth daily. Take daily for 21 days then do not take for 7 days.    [provider]  furosemide (LASIX) 20 MG tablet Take 1 tablet (20 mg total) by mouth daily for 4 days. 11/25/20 11/29/20  Phineas Semen, MD  ibuprofen (ADVIL,MOTRIN) 800 MG tablet Take 800 mg by mouth every 8 (eight) hours as needed for mild pain or moderate pain.     [provider]  medroxyPROGESTERone (PROVERA) 2.5 MG tablet Take 2.5 mg by mouth See admin instructions. Daily the last 5 days  of each month    [provider]  metoprolol succinate (TOPROL-XL) 50 MG 24 hr tablet Take 50 mg by mouth daily.  08/22/17   [provider]  pantoprazole (PROTONIX) 40 MG tablet Take 40 mg by mouth daily. 08/17/17   [provider]  predniSONE (STERAPRED UNI-PAK 21 TAB) 10 MG (21) TBPK tablet Per packaging instructions 11/25/20   Phineas Semen, MD  QUEtiapine (SEROQUEL) 200 MG tablet Take 200 mg by mouth at bedtime.  08/17/17   [provider]  roflumilast (DALIRESP) 500 MCG TABS tablet Take 500 mcg by mouth daily.    [provider]  rosuvastatin (CRESTOR) 40 MG tablet Take 40 mg by mouth at bedtime.    [provider]  TRELEGY ELLIPTA 100-62.5-25 MCG/INH AEPB Inhale 1 puff into the lungs daily. 12/27/18   [provider]    Allergies Patient has no known allergies.  Family History  Problem Relation Age of Onset   Breast cancer Paternal Grandmother    Bone cancer Paternal Grandmother    Pancreatic cancer Mother    Heart disease Father    Heart disease Paternal Uncle    Stroke Paternal Grandfather     Social History Social History   Tobacco Use   Smoking status: Every Day    Packs/day: 1.00    Years: 45.00    Pack years: 45.00    Types: Cigarettes   Smokeless tobacco: Never   Tobacco comments:    since age 63  Vaping Use   Vaping Use: Never used  Substance Use Topics   Alcohol use: Not Currently    Alcohol/week: 1.0 standard drink    Types: 1 Cans of beer per week   Drug use: No    Review of Systems Constitutional: No fever. Eyes: No visual changes. ENT: No sore throat. Cardiovascular: Denies chest pain. Respiratory: + shortness of breath. Gastrointestinal: No nausea, vomiting, diarrhea. Genitourinary: Negative for dysuria. Musculoskeletal: Negative for back pain. Skin: Negative for rash. Neurological: Negative for focal weakness or numbness.  ____________________________________________   PHYSICAL  EXAM:  VITAL SIGNS: ED Triage Vitals  Enc Vitals Group     BP 05/17/21 2257 97/75     Pulse Rate 05/17/21 2257 (!) 107     Resp 05/17/21 2257 20     Temp 05/17/21 2257 97.8 F (36.6 C)     Temp Source 05/17/21 2257 Oral     SpO2 05/17/21 2257 98 %     Weight --      Height --      Head Circumference --      Peak Flow --      Pain Score 05/17/21 2256 0     Pain Loc --      Pain Edu? --      Excl. in GC? --    CONSTITUTIONAL: Alert and oriented and responds appropriately to questions.  Chronically  ill-appearing HEAD: Normocephalic EYES: Conjunctivae clear, pupils appear equal, EOM appear intact ENT: normal nose; moist mucous membranes NECK: Supple, normal ROM CARD: Regular and minimally tachycardic; S1 and S2 appreciated; no murmurs, no clicks, no rubs, no gallops RESP: Patient has expiratory wheezes.  Significantly diminished aeration on exam.  No tachypnea or respiratory distress.  Speaking full sentences.  No rhonchi or rales. ABD/GI: Normal bowel sounds; non-distended; soft, non-tender, no rebound, no guarding, no peritoneal signs, no hepatosplenomegaly BACK: The back appears normal EXT: Normal ROM in all joints; no deformity noted, no edema; no cyanosis, no calf tenderness or calf swelling SKIN: Normal color for age and race; warm; no rash on exposed skin NEURO: Moves all extremities equally PSYCH: The patient's mood and manner are appropriate.  ____________________________________________   LABS (all labs ordered are listed, but only abnormal results are displayed)  Labs Reviewed  CBC WITH DIFFERENTIAL/PLATELET - Abnormal; Notable for the following components:      Result Value   WBC 11.0 (*)    All other components within normal limits  BASIC METABOLIC PANEL - Abnormal; Notable for the following components:   Glucose, Bld 109 (*)    All other components within normal limits  RESP PANEL BY RT-PCR (FLU A&B, COVID) ARPGX2  BRAIN NATRIURETIC PEPTIDE  URINALYSIS,  COMPLETE (UACMP) WITH MICROSCOPIC  TROPONIN I (HIGH SENSITIVITY)   ____________________________________________  EKG   EKG Interpretation  Date/Time:  Wednesday May 17 2021 23:03:33 EDT Ventricular Rate:  110 PR Interval:  130 QRS Duration: 58 QT Interval:  312 QTC Calculation: 422 R Axis:   69 Text Interpretation: Sinus tachycardia Otherwise normal ECG Confirmed by Rochele Raring [phone]) on 05/18/2021 4:03:22 AM         ____________________________________________  RADIOLOGY Normajean Baxter Krithi Bray, personally viewed and evaluated these images (plain radiographs) as part of my medical decision making, as well as reviewing the written report by the radiologist.  ED MD interpretation: Chest x-ray clear.  Official radiology report(s): DG Chest 2 View  Result Date: 05/17/2021 CLINICAL DATA:  Shortness of breath EXAM: CHEST - 2 VIEW COMPARISON:  02/25/2020 FINDINGS: Heart and mediastinal contours are within normal limits. No focal opacities or effusions. No acute bony abnormality. IMPRESSION: No active cardiopulmonary disease. Electronically Signed   By: Charlett Nose M.D.   On: 05/17/2021 23:28    ____________________________________________   PROCEDURES  Procedure(s) performed (including Critical Care):  Procedures  CRITICAL CARE Performed by: Baxter Hire Amika Tassin   Total critical care time: 45 minutes  Critical care time was exclusive of separately billable procedures and treating other patients.  Critical care was necessary to treat or prevent imminent or life-threatening deterioration.  Critical care was time spent personally by me on the following activities: development of treatment plan with patient and/or surrogate as well as nursing, discussions with consultants, evaluation of patient's response to treatment, examination of patient, obtaining history from patient or surrogate, ordering and performing treatments and interventions, ordering and review of laboratory studies,  ordering and review of radiographic studies, pulse oximetry and re-evaluation of patient's condition.  ____________________________________________   INITIAL IMPRESSION / ASSESSMENT AND PLAN / ED COURSE  As part of my medical decision making, I reviewed the following data within the electronic MEDICAL RECORD NUMBER History obtained from family, Nursing notes reviewed and incorporated, Labs reviewed , EKG interpreted , Old EKG reviewed, Old chart reviewed, Radiograph reviewed , Discussed with admitting physician , and Notes from prior ED visits  Patient here with lightheadedness, near syncope likely due to patient on multiple blood pressure medications.  Will check orthostatic vital signs.  No signs or symptoms of sepsis at this time.  Will give IV hydration.  She does not appear volume overloaded today.  BNP is normal and chest x-ray is clear.  Troponin x2 negative.  EKG shows no new ischemic change.  Will check urinalysis.  Also complains of feeling short of breath.  Suspect COPD exacerbation.  Will give duo nebs, Solu-Medrol and reassess.  Doubt ACS, PE, dissection.  She denies any chest pain.  ED PROGRESS  Blood pressure improving with IV hydration.  Currently in the 110/70s.  Patient reports feeling better with breathing treatments and is now more open and wheezing more.  She has received 3 duo nebs and Solu-Medrol.  Will admit for continued hydration and monitoring of blood pressure as well as treatment for COPD exacerbation.  Patient comfortable with this plan.  6:18 AM Discussed patient's case with hospitalist, Dr. Arville CareMansy.  I have recommended admission and patient (and family if present) agree with this plan. Admitting physician will place admission orders.   I reviewed all nursing notes, vitals, pertinent previous records and reviewed/interpreted all EKGs, lab and urine results, imaging (as available).  ____________________________________________   FINAL CLINICAL IMPRESSION(S) /  ED DIAGNOSES  Final diagnoses:  COPD exacerbation (HCC)  Hypotension due to medication     ED Discharge Orders     None       *Please note:  Zoila Shutterrudy Faye Nunnery was evaluated in Emergency Department on 05/18/2021 for the symptoms described in the history of present illness. She was evaluated in the context of the global COVID-19 pandemic, which necessitated consideration that the patient might be at risk for infection with the SARS-CoV-2 virus that causes COVID-19. Institutional protocols and algorithms that pertain to the evaluation of patients at risk for COVID-19 are in a state of rapid change based on information released by regulatory bodies including the CDC and federal and state organizations. These policies and algorithms were followed during the patient's care in the ED.  Some ED evaluations and interventions may be delayed as a result of limited staffing during and the pandemic.*   Note:  This document was prepared using Dragon voice recognition software and may include unintentional dictation errors.    Shakeda Pearse, Layla MawKristen N, DO 05/18/21 [phone]

## 2021-05-18 NOTE — ED Notes (Signed)
Pt resting comfortably at this time. Pt states some resp issues but states she feels as if she is improving. Pt is currently on 3L/min. VSS.

## 2021-05-19 ENCOUNTER — Encounter: Payer: Self-pay | Admitting: Internal Medicine

## 2021-05-19 DIAGNOSIS — J441 Chronic obstructive pulmonary disease with (acute) exacerbation: Secondary | ICD-10-CM | POA: Diagnosis not present

## 2021-05-19 LAB — CBC
HCT: 36.8 % (ref 36.0–46.0)
Hemoglobin: 12.8 g/dL (ref 12.0–15.0)
MCH: 32.7 pg (ref 26.0–34.0)
MCHC: 34.8 g/dL (ref 30.0–36.0)
MCV: 94.1 fL (ref 80.0–100.0)
Platelets: 223 10*3/uL (ref 150–400)
RBC: 3.91 MIL/uL (ref 3.87–5.11)
RDW: 14.8 % (ref 11.5–15.5)
WBC: 9.5 10*3/uL (ref 4.0–10.5)
nRBC: 0 % (ref 0.0–0.2)

## 2021-05-19 LAB — HIV ANTIBODY (ROUTINE TESTING W REFLEX): HIV Screen 4th Generation wRfx: NONREACTIVE

## 2021-05-19 MED ORDER — ESTROGENS CONJUGATED 0.3 MG PO TABS
0.3000 mg | ORAL_TABLET | Freq: Every day | ORAL | Status: DC
  Administered 2021-05-20 – 2021-05-22 (×3): 0.3 mg via ORAL
  Filled 2021-05-19 (×4): qty 1

## 2021-05-19 MED ORDER — IPRATROPIUM-ALBUTEROL 0.5-2.5 (3) MG/3ML IN SOLN
3.0000 mL | Freq: Four times a day (QID) | RESPIRATORY_TRACT | Status: DC
  Administered 2021-05-19 – 2021-05-22 (×11): 3 mL via RESPIRATORY_TRACT
  Filled 2021-05-19 (×11): qty 3

## 2021-05-19 MED ORDER — DM-GUAIFENESIN ER 30-600 MG PO TB12
1.0000 | ORAL_TABLET | Freq: Two times a day (BID) | ORAL | Status: DC
  Administered 2021-05-19 – 2021-05-22 (×7): 1 via ORAL
  Filled 2021-05-19 (×7): qty 1

## 2021-05-19 NOTE — Progress Notes (Addendum)
PROGRESS NOTE    Krystal Lee   [id number]  DOB: 08-14-1956  PCP: Krystal Lee, Krystal P, FNP    DOA: 05/18/2021 LOS: 1   Assessment & Plan   Principal Problem:   COPD exacerbation (HCC) Active Problems:   Bipolar 1 disorder (HCC)   Depression   Anxiety   Hypertension   Sepsis (HCC)   HLD (hyperlipidemia)   GERD (gastroesophageal reflux disease)   Tobacco abuse   COPD with acute exacerbation with acute bronchitis -  Chronic respiratory failure with hypoxia - stable on baseline home 2L/min (typically uses at night and PRN) No pneumonia seen on CXR. --continue IV steroids and Zithromax --Bronchodilators, Mucinex --Singular --IS and flutter valve for pulmonary hygiene --Follow up sputum culture --O2 as needed maintain sats 88-94%   Sepsis due to Acute Bronchitis - sepsis POA with tachycardia, tachypnea, leukocytosis.   Normal lactic acid.  No fever.   --given IV fluids, now off --Zithromax as above --Sepsis physiology improved   Bipolar 1 disorder, depression and anxiety -Continue home medications   Hypertension - Reported hypotension at home, had recently been started on and/or increased dose of Diovan and up-titrating Verquvo per PCP.  Unable to see PCP records.   -Continue home Coreg.   -PRN hydralazine -Hold Lasix, Verquvo and valsartan  Hx of chronic diastolic CHF - on prior echo in 2020.  Pt recently started on Verquvo by PCP which is indicated for HFrEF.  Pt reports serial echo's outpatient that are not available in Care Everywhere or [facility].  Pt reports planned referral to see Dr. Mariah MillingGollan with cardiology. She seems well-compensated and euvolemic without edema, CXR negative, BNP normal. --Echo pending  --Holding Lasix, Verquvo and ARB as above    HLD - Crestor   GERD - Protonix   Tobacco abuse - Counseled on importance of cessation. --Nicotine patch  Patient BMI: There is no height or weight on file to calculate BMI.   DVT prophylaxis: enoxaparin  (LOVENOX) injection 40 mg Start: 05/18/21 2200   Diet:  Diet Orders (From admission, onward)     Start     Ordered   05/18/21 0850  Diet Heart Room service appropriate? Yes; Fluid consistency: Thin  Diet effective now       Question Answer Comment  Room service appropriate? Yes   Fluid consistency: Thin   Na restriction, if any: 2 gm Na      05/18/21 0849              Code Status: Full Code   Brief Narrative / Hospital Course to Date:   65 y.o. female with medical history significant of hypertension, hyperlipidemia, COPD on 2.5 L oxygen, GERD, anxiety, bipolar, schizoaffective disorder, HCV, who presents with shortness of breath, progressively worsening over several days, with associated cough and chest tightness.  Admitted for management of acute exacerbation of COPD.   Subjective 05/19/21    Pt seen with sister at bedside, still in ED holding for a bed.  She reports feeling slightly better.  Continues to have cough.  Felt very SOB after up to bathroom, much worse than at her baseline.  Having more phlegm production today.   Disposition Plan & Communication   Status is: Inpatient  Remains inpatient appropriate because:IV treatments appropriate due to intensity of illness or inability to take PO  Dispo: The patient is from: Home              Anticipated d/c is to: Home  Patient currently is not medically stable to d/c.   Difficult to place patient No   Family Communication: sister at bedside on rounds    Consults, Procedures, Significant Events   Consultants:  none  Procedures:  none  Antimicrobials:  Anti-infectives (From admission, onward)    Start     Dose/Rate Route Frequency Ordered Stop   05/19/21 1000  azithromycin (ZITHROMAX) tablet 250 mg       See Hyperspace for full Linked Orders Report.   250 mg Oral Daily 05/18/21 0850 05/23/21 0959   05/18/21 1000  azithromycin (ZITHROMAX) tablet 500 mg       See Hyperspace for full Linked Orders  Report.   500 mg Oral Daily 05/18/21 0850 05/18/21 0922         Micro    Objective   Vitals:   05/18/21 1200 05/18/21 1300 05/18/21 2128 05/19/21 0328  BP: 133/77 114/61 (!) 170/95 123/84  Pulse: 91 77 91 89  Resp: 20 20 14 16   Temp:   98.2 F (36.8 C)   TempSrc:      SpO2:  95% 98% 99%   No intake or output data in the 24 hours ending 05/19/21 0807 There were no vitals filed for this visit.  Physical Exam:  General exam: awake, alert, no acute distress, chronically ill-appearing HEENT: atraumatic, clear conjunctiva, anicteric sclera, moist mucus membranes, hearing grossly normal  Respiratory system: poor aeration with high-pitched expiratory wheezes throughout, No rales or rhonchi, mild conversational dyspnea Cardiovascular system: normal S1/S2, RRR, no JVD, murmurs, rubs, gallops, no pedal edema.   Central nervous system: A&O x4. no gross focal neurologic deficits, normal speech Extremities: moves all, no edema, normal tone Psychiatry: normal mood, congruent affect, judgement and insight appear normal  Labs   Data Reviewed: I have personally reviewed following labs and imaging studies  CBC: Recent Labs  Lab 05/17/21 2312 05/19/21 0720  WBC 11.0* 9.5  NEUTROABS 7.1  --   HGB 12.9 12.8  HCT 37.2 36.8  MCV 93.2 94.1  PLT 270 223   Basic Metabolic Panel: Recent Labs  Lab 05/17/21 2312  NA 135  K 3.6  CL 103  CO2 22  GLUCOSE 109*  BUN 13  CREATININE 0.99  CALCIUM 8.9   GFR: CrCl cannot be calculated (Unknown ideal weight.). Liver Function Tests: No results for input(s): AST, ALT, ALKPHOS, BILITOT, PROT, ALBUMIN in the last 168 hours. No results for input(s): LIPASE, AMYLASE in the last 168 hours. No results for input(s): AMMONIA in the last 168 hours. Coagulation Profile: No results for input(s): INR, PROTIME in the last 168 hours. Cardiac Enzymes: No results for input(s): CKTOTAL, CKMB, CKMBINDEX, TROPONINI in the last 168 hours. BNP (last 3  results) No results for input(s): PROBNP in the last 8760 hours. HbA1C: No results for input(s): HGBA1C in the last 72 hours. CBG: No results for input(s): GLUCAP in the last 168 hours. Lipid Profile: No results for input(s): CHOL, HDL, LDLCALC, TRIG, CHOLHDL, LDLDIRECT in the last 72 hours. Thyroid Function Tests: No results for input(s): TSH, T4TOTAL, FREET4, T3FREE, THYROIDAB in the last 72 hours. Anemia Panel: No results for input(s): VITAMINB12, FOLATE, FERRITIN, TIBC, IRON, RETICCTPCT in the last 72 hours. Sepsis Labs: Recent Labs  Lab 05/17/21 2312 05/18/21 1105  PROCALCITON <0.10  --   LATICACIDVEN  --  1.9    Recent Results (from the past 240 hour(s))  Resp Panel by RT-PCR (Flu A&B, Covid) Nasopharyngeal Swab     Status: None  Collection Time: 05/18/21  6:16 AM   Specimen: Nasopharyngeal Swab; Nasopharyngeal(NP) swabs in vial transport medium  Result Value Ref Range Status   SARS Coronavirus 2 by RT PCR NEGATIVE NEGATIVE Final    Comment: (NOTE) SARS-CoV-2 target nucleic acids are NOT DETECTED.  The SARS-CoV-2 RNA is generally detectable in upper respiratory specimens during the acute phase of infection. The lowest concentration of SARS-CoV-2 viral copies this assay can detect is 138 copies/mL. A negative result does not preclude SARS-Cov-2 infection and should not be used as the sole basis for treatment or other patient management decisions. A negative result may occur with  improper specimen collection/handling, submission of specimen other than nasopharyngeal swab, presence of viral mutation(s) within the areas targeted by this assay, and inadequate number of viral copies(<138 copies/mL). A negative result must be combined with clinical observations, patient history, and epidemiological information. The expected result is Negative.  Fact Sheet for Patients:  BloggerCourse.com  Fact Sheet for Healthcare Providers:   SeriousBroker.it  This test is no t yet approved or cleared by the [place] FDA and  has been authorized for detection and/or diagnosis of SARS-CoV-2 by FDA under an Emergency Use Authorization (EUA). This EUA will remain  in effect (meaning this test can be used) for the duration of the COVID-19 declaration under Section 564(b)(1) of the Act, 21 U.S.C.section 360bbb-3(b)(1), unless the authorization is terminated  or revoked sooner.       Influenza A by PCR NEGATIVE NEGATIVE Final   Influenza B by PCR NEGATIVE NEGATIVE Final    Comment: (NOTE) The Xpert Xpress SARS-CoV-2/FLU/RSV plus assay is intended as an aid in the diagnosis of influenza from Nasopharyngeal swab specimens and should not be used as a sole basis for treatment. Nasal washings and aspirates are unacceptable for Xpert Xpress SARS-CoV-2/FLU/RSV testing.  Fact Sheet for Patients: BloggerCourse.com  Fact Sheet for Healthcare Providers: SeriousBroker.it  This test is not yet approved or cleared by the [place] FDA and has been authorized for detection and/or diagnosis of SARS-CoV-2 by FDA under an Emergency Use Authorization (EUA). This EUA will remain in effect (meaning this test can be used) for the duration of the COVID-19 declaration under Section 564(b)(1) of the Act, 21 U.S.C. section 360bbb-3(b)(1), unless the authorization is terminated or revoked.  Performed at [facility], [address] [place], [place] [address]       Imaging Studies   DG Chest 2 View  Result Date: 05/17/2021 CLINICAL DATA:  Shortness of breath EXAM: CHEST - 2 VIEW COMPARISON:  02/25/2020 FINDINGS: Heart and mediastinal contours are within normal limits. No focal opacities or effusions. No acute bony abnormality. IMPRESSION: No active cardiopulmonary disease. Electronically Signed   By: Charlett Nose M.D.   On: 05/17/2021 23:28      Medications   Scheduled Meds:  azithromycin  250 mg Oral Daily   carvedilol  6.25 mg Oral Daily   enoxaparin (LOVENOX) injection  40 mg Subcutaneous Q24H   fluticasone furoate-vilanterol  1 puff Inhalation Daily   And   umeclidinium bromide  1 puff Inhalation Daily   ipratropium-albuterol  3 mL Nebulization Q4H   methylPREDNISolone (SOLU-MEDROL) injection  40 mg Intravenous Q12H   montelukast  10 mg Oral Daily   nicotine  21 mg Transdermal Daily   pantoprazole  40 mg Oral BID   progesterone  100 mg Oral QHS   QUEtiapine  300 mg Oral QHS   roflumilast  500 mcg Oral Daily   rosuvastatin  40 mg Oral QHS   Continuous Infusions:     LOS: 1 day    Time spent: 30 minutes    Pennie Banter, DO [facility]  05/19/2021, 8:07 AM      If 7PM-7AM, please contact night-coverage. How to contact the [facility] Attending or Consulting provider 7A - 7P or covering provider during after hours 7P -7A, for this patient?    Check the care team in [facility] and look for a) attending/consulting [facility] provider listed and b) the [facility] team listed Log into [email or web address] and use [place]'s universal password to access. If you do not have the password, please contact the hospital operator. Locate the [facility] provider you are looking for under [facility] and page to a number that you can be directly reached. If you still have difficulty reaching the provider, please page the [facility] (Director on Call) for the Hospitalists listed on amion for assistance.

## 2021-05-19 NOTE — Hospital Course (Signed)
65 y.o. female with medical history significant of hypertension, hyperlipidemia, COPD on 2.5 L oxygen, GERD, anxiety, bipolar, schizoaffective disorder, HCV, who presents with shortness of breath, progressively worsening over several days, with associated cough and chest tightness.  Admitted for management of acute exacerbation of COPD.

## 2021-05-19 NOTE — ED Notes (Signed)
Vitals deferred until pt is awake 

## 2021-05-19 NOTE — Progress Notes (Signed)
Pt ambulating well with stand by assist. Unable to expectorate any sputum for sampling. Maintained on 3L O2 via nasal cannula per baseline. Still wheezy and dyspneic on exertion. Q4 nebs given.

## 2021-05-19 NOTE — ED Notes (Signed)
Pt given ginger ale with meal tray

## 2021-05-20 ENCOUNTER — Inpatient Hospital Stay (HOSPITAL_COMMUNITY)
Admit: 2021-05-20 | Discharge: 2021-05-20 | Disposition: A | Discharge status: Home / Self Care | Payer: Medicare HMO | Attending: Internal Medicine | Admitting: Internal Medicine

## 2021-05-20 DIAGNOSIS — I10 Essential (primary) hypertension: Secondary | ICD-10-CM

## 2021-05-20 DIAGNOSIS — R0609 Other forms of dyspnea: Secondary | ICD-10-CM | POA: Diagnosis not present

## 2021-05-20 DIAGNOSIS — J441 Chronic obstructive pulmonary disease with (acute) exacerbation: Secondary | ICD-10-CM | POA: Diagnosis not present

## 2021-05-20 MED ORDER — IRBESARTAN 75 MG PO TABS
37.5000 mg | ORAL_TABLET | Freq: Every day | ORAL | Status: DC
Start: 2021-05-20 — End: 2021-05-21
  Administered 2021-05-20: 37.5 mg via ORAL
  Filled 2021-05-20 (×2): qty 0.5

## 2021-05-20 MED ORDER — FUROSEMIDE 20 MG PO TABS
20.0000 mg | ORAL_TABLET | Freq: Every day | ORAL | Status: DC
  Administered 2021-05-20 – 2021-05-22 (×3): 20 mg via ORAL
  Filled 2021-05-20 (×3): qty 1

## 2021-05-20 NOTE — Progress Notes (Signed)
PROGRESS NOTE    Krystal Lee   MCN:[id number]  DOB: [birth year]  PCP: Armando Gang, FNP    DOA: 05/18/2021 LOS: 2   Assessment & Plan   Principal Problem:   COPD exacerbation (HCC) Active Problems:   Bipolar 1 disorder (HCC)   Depression   Anxiety   Hypertension   Sepsis (HCC)   HLD (hyperlipidemia)   GERD (gastroesophageal reflux disease)   Tobacco abuse   COPD with acute exacerbation with acute bronchitis -  Chronic respiratory failure with hypoxia - stable on baseline home 2L/min (typically uses at night and PRN) No pneumonia seen on CXR. --continue IV steroids and Zithromax --Bronchodilators, Mucinex --Singular --IS and flutter valve for pulmonary hygiene --Follow up sputum culture --O2 as needed maintain sats 88-94%   Sepsis due to Acute Bronchitis - sepsis POA with tachycardia, tachypnea, leukocytosis.   Normal lactic acid.  No fever.   --given IV fluids, now off --Zithromax as above --Sepsis physiology improved  Hypertension - Reported hypotension at home, had recently been started on and/or increased dose of Diovan and up-titrating Verquvo per PCP.   Unable to see PCP records.   -Continue home Coreg.   -Held Lasix, Verquvo and Diovan on admission given significant hypotension at home  -7/2 resume ARB (irbesartan on formulary) and increase dose as needed.  Resume Lasix. -PRN hydralazine -Hold Verquvo for now  Hx of chronic diastolic CHF - on prior echo in 2020.  Pt recently started on Verquvo by PCP which is indicated for HFrEF.  Pt reports serial echo's outpatient that are not available in Care Everywhere or [facility].  Pt reports planned referral to see Dr. Mariah Milling with cardiology. She seems well-compensated and euvolemic without edema, CXR negative, BNP normal. --Echo pending  --Resumed Lasix, and ARB --Continue holding Verquvo   HLD - Crestor   GERD - Protonix  Bipolar 1 disorder, depression and anxiety -Continue home medications   Tobacco  abuse - Counseled on importance of cessation. --Nicotine patch  Patient BMI: Body mass index is 30.9 kg/m.   DVT prophylaxis: enoxaparin (LOVENOX) injection 40 mg Start: 05/18/21 2200   Diet:  Diet Orders (From admission, onward)     Start     Ordered   05/18/21 0850  Diet Heart Room service appropriate? Yes; Fluid consistency: Thin  Diet effective now       Question Answer Comment  Room service appropriate? Yes   Fluid consistency: Thin   Na restriction, if any: 2 gm Na      05/18/21 0849              Code Status: Full Code   Brief Narrative / Hospital Course to Date:   65 y.o. female with medical history significant of hypertension, hyperlipidemia, COPD on 2.5 L oxygen, GERD, anxiety, bipolar, schizoaffective disorder, HCV, who presents with shortness of breath, progressively worsening over several days, with associated cough and chest tightness.  Admitted for management of acute exacerbation of COPD.   Subjective 05/20/21    Pt awake resting in bed when seen today.  She reports still having significant shortness of breath with any exertion.  Worried about her blood pressure now high.  Denies fevers or chills or other acute complaints.   Disposition Plan & Communication   Status is: Inpatient  Remains inpatient appropriate because: Remains on IV steroids for COPD exacerbation; BP uncontrolled  Dispo: The patient is from: Home  Anticipated d/c is to: Home              Patient currently is not medically stable to d/c.   Difficult to place patient No   Family Communication: sister at bedside on rounds    Consults, Procedures, Significant Events   Consultants:  none  Procedures:  none  Antimicrobials:  Anti-infectives (From admission, onward)    Start     Dose/Rate Route Frequency Ordered Stop   05/19/21 1000  azithromycin (ZITHROMAX) tablet 250 mg       See Hyperspace for full Linked Orders Report.   250 mg Oral Daily 05/18/21 0850 05/23/21  0959   05/18/21 1000  azithromycin (ZITHROMAX) tablet 500 mg       See Hyperspace for full Linked Orders Report.   500 mg Oral Daily 05/18/21 0850 05/18/21 0922         Micro    Objective   Vitals:   05/20/21 0252 05/20/21 0640 05/20/21 0744 05/20/21 0924  BP: (!) 153/73 (!) 160/88  (!) 180/83  Pulse: 79 69  68  Resp:  18  16  Temp:  97.9 F (36.6 C)  97.6 F (36.4 C)  TempSrc:      SpO2:  99% 98% 98%  Weight:      Height:        Intake/Output Summary (Last 24 hours) at 05/20/2021 1514 Last data filed at 05/20/2021 0700 Gross per 24 hour  Intake 240 ml  Output 600 ml  Net -360 ml   Filed Weights   05/19/21 1715  Weight: 69.4 kg    Physical Exam:  General exam: awake, alert, no acute distress, chronically ill-appearing Respiratory system: continues with poor aeration, no expiratory wheezes, No rales or rhonchi, normal respiratory effort at rest Cardiovascular system: normal S1/S2, RRR, no JVD, murmurs, rubs, gallops.   Central nervous system: A&O x4. no gross focal neurologic deficits, normal speech Extremities: moves all, no edema, no cyanosis Psychiatry: normal mood, congruent affect, judgement and insight appear normal  Labs   Data Reviewed: I have personally reviewed following labs and imaging studies  CBC: Recent Labs  Lab 05/17/21 2312 05/19/21 0720  WBC 11.0* 9.5  NEUTROABS 7.1  --   HGB 12.9 12.8  HCT 37.2 36.8  MCV 93.2 94.1  PLT 270 223   Basic Metabolic Panel: Recent Labs  Lab 05/17/21 2312  NA 135  K 3.6  CL 103  CO2 22  GLUCOSE 109*  BUN 13  CREATININE 0.99  CALCIUM 8.9   GFR: Estimated Creatinine Clearance: 49.3 mL/min (by C-G formula based on SCr of 0.99 mg/dL). Liver Function Tests: No results for input(s): AST, ALT, ALKPHOS, BILITOT, PROT, ALBUMIN in the last 168 hours. No results for input(s): LIPASE, AMYLASE in the last 168 hours. No results for input(s): AMMONIA in the last 168 hours. Coagulation Profile: No results  for input(s): INR, PROTIME in the last 168 hours. Cardiac Enzymes: No results for input(s): CKTOTAL, CKMB, CKMBINDEX, TROPONINI in the last 168 hours. BNP (last 3 results) No results for input(s): PROBNP in the last 8760 hours. HbA1C: No results for input(s): HGBA1C in the last 72 hours. CBG: No results for input(s): GLUCAP in the last 168 hours. Lipid Profile: No results for input(s): CHOL, HDL, LDLCALC, TRIG, CHOLHDL, LDLDIRECT in the last 72 hours. Thyroid Function Tests: No results for input(s): TSH, T4TOTAL, FREET4, T3FREE, THYROIDAB in the last 72 hours. Anemia Panel: No results for input(s): VITAMINB12, FOLATE, FERRITIN, TIBC, IRON, RETICCTPCT  in the last 72 hours. Sepsis Labs: Recent Labs  Lab 05/17/21 2312 05/18/21 1105  PROCALCITON <0.10  --   LATICACIDVEN  --  1.9    Recent Results (from the past 240 hour(s))  Resp Panel by RT-PCR (Flu A&B, Covid) Nasopharyngeal Swab     Status: None   Collection Time: 05/18/21  6:16 AM   Specimen: Nasopharyngeal Swab; Nasopharyngeal(NP) swabs in vial transport medium  Result Value Ref Range Status   SARS Coronavirus 2 by RT PCR NEGATIVE NEGATIVE Final    Comment: (NOTE) SARS-CoV-2 target nucleic acids are NOT DETECTED.  The SARS-CoV-2 RNA is generally detectable in upper respiratory specimens during the acute phase of infection. The lowest concentration of SARS-CoV-2 viral copies this assay can detect is 138 copies/mL. A negative result does not preclude SARS-Cov-2 infection and should not be used as the sole basis for treatment or other patient management decisions. A negative result may occur with  improper specimen collection/handling, submission of specimen other than nasopharyngeal swab, presence of viral mutation(s) within the areas targeted by this assay, and inadequate number of viral copies(<138 copies/mL). A negative result must be combined with clinical observations, patient history, and epidemiological information.  The expected result is Negative.  Fact Sheet for Patients:  BloggerCourse.com  Fact Sheet for Healthcare Providers:  SeriousBroker.it  This test is no t yet approved or cleared by the [place] FDA and  has been authorized for detection and/or diagnosis of SARS-CoV-2 by FDA under an Emergency Use Authorization (EUA). This EUA will remain  in effect (meaning this test can be used) for the duration of the COVID-19 declaration under Section 564(b)(1) of the Act, 21 U.S.C.section 360bbb-3(b)(1), unless the authorization is terminated  or revoked sooner.       Influenza A by PCR NEGATIVE NEGATIVE Final   Influenza B by PCR NEGATIVE NEGATIVE Final    Comment: (NOTE) The Xpert Xpress SARS-CoV-2/FLU/RSV plus assay is intended as an aid in the diagnosis of influenza from Nasopharyngeal swab specimens and should not be used as a sole basis for treatment. Nasal washings and aspirates are unacceptable for Xpert Xpress SARS-CoV-2/FLU/RSV testing.  Fact Sheet for Patients: BloggerCourse.com  Fact Sheet for Healthcare Providers: SeriousBroker.it  This test is not yet approved or cleared by the [place] FDA and has been authorized for detection and/or diagnosis of SARS-CoV-2 by FDA under an Emergency Use Authorization (EUA). This EUA will remain in effect (meaning this test can be used) for the duration of the COVID-19 declaration under Section 564(b)(1) of the Act, 21 U.S.C. section 360bbb-3(b)(1), unless the authorization is terminated or revoked.  Performed at [facility], [address], [place], [place] [address]       Imaging Studies   No results found.   Medications   Scheduled Meds:  azithromycin  250 mg Oral Daily   carvedilol  6.25 mg Oral Daily   dextromethorphan-guaiFENesin  1 tablet Oral BID   enoxaparin (LOVENOX) injection  40 mg Subcutaneous  Q24H   estrogens (conjugated)  0.3 mg Oral Daily   fluticasone furoate-vilanterol  1 puff Inhalation Daily   furosemide  20 mg Oral Daily   ipratropium-albuterol  3 mL Nebulization Q6H   irbesartan  37.5 mg Oral Daily   methylPREDNISolone (SOLU-MEDROL) injection  40 mg Intravenous Q12H   montelukast  10 mg Oral Daily   nicotine  21 mg Transdermal Daily   pantoprazole  40 mg Oral BID   progesterone  100 mg Oral QHS  QUEtiapine  300 mg Oral QHS   roflumilast  500 mcg Oral Daily   rosuvastatin  40 mg Oral QHS   Continuous Infusions:     LOS: 2 days    Time spent: 25 minutes with > 50% spent on coordination of care and at bedside.    Pennie Banter, DO [facility]  05/20/2021, 3:14 PM      If 7PM-7AM, please contact night-coverage. How to contact the [facility] Attending or Consulting provider 7A - 7P or covering provider during after hours 7P -7A, for this patient?    Check the care team in [facility] and look for a) attending/consulting [facility] provider listed and b) the [facility] team listed Log into [email or web address] and use [place]'s universal password to access. If you do not have the password, please contact the hospital operator. Locate the [facility] provider you are looking for under [facility] and page to a number that you can be directly reached. If you still have difficulty reaching the provider, please page the [facility] (Director on Call) for the Hospitalists listed on amion for assistance.

## 2021-05-20 NOTE — Progress Notes (Signed)
*  PRELIMINARY RESULTS* Echocardiogram 2D Echocardiogram has been performed.  Lenor Coffin 05/20/2021, 2:50 PM

## 2021-05-21 DIAGNOSIS — I10 Essential (primary) hypertension: Secondary | ICD-10-CM | POA: Diagnosis not present

## 2021-05-21 DIAGNOSIS — J441 Chronic obstructive pulmonary disease with (acute) exacerbation: Secondary | ICD-10-CM | POA: Diagnosis not present

## 2021-05-21 LAB — ECHOCARDIOGRAM COMPLETE
AR max vel: 2.16 cm2
AV Peak grad: 4.8 mmHg
Ao pk vel: 1.09 m/s
Area-P 1/2: 3.56 cm2
Height: 59 in
S' Lateral: 3.29 cm
Weight: 2447.99 oz

## 2021-05-21 LAB — BASIC METABOLIC PANEL
Anion gap: 4 — ABNORMAL LOW (ref 5–15)
BUN: 24 mg/dL — ABNORMAL HIGH (ref 8–23)
CO2: 24 mmol/L (ref 22–32)
Calcium: 8.4 mg/dL — ABNORMAL LOW (ref 8.9–10.3)
Chloride: 110 mmol/L (ref 98–111)
Creatinine, Ser: 0.68 mg/dL (ref 0.44–1.00)
GFR, Estimated: >60 mL/min (ref >60)
Glucose, Bld: 97 mg/dL (ref 70–99)
Potassium: 3.2 mmol/L — ABNORMAL LOW (ref 3.5–5.1)
Sodium: 138 mmol/L (ref 135–145)

## 2021-05-21 LAB — MAGNESIUM: Magnesium: 1.9 mg/dL (ref 1.7–2.4)

## 2021-05-21 MED ORDER — POTASSIUM CHLORIDE CRYS ER 20 MEQ PO TBCR
40.0000 meq | EXTENDED_RELEASE_TABLET | Freq: Once | ORAL | Status: AC
  Administered 2021-05-21: 40 meq via ORAL
  Filled 2021-05-21: qty 2

## 2021-05-21 MED ORDER — IRBESARTAN 150 MG PO TABS
75.0000 mg | ORAL_TABLET | Freq: Every day | ORAL | Status: DC
  Administered 2021-05-21: 75 mg via ORAL

## 2021-05-21 MED ORDER — CARVEDILOL 6.25 MG PO TABS
6.2500 mg | ORAL_TABLET | Freq: Every day | ORAL | Status: DC
  Administered 2021-05-22: 10:00:00 6.25 mg via ORAL
  Filled 2021-05-21: qty 1

## 2021-05-21 MED ORDER — PREDNISONE 20 MG PO TABS
40.0000 mg | ORAL_TABLET | Freq: Every day | ORAL | Status: DC
  Administered 2021-05-22: 40 mg via ORAL
  Filled 2021-05-21: qty 2

## 2021-05-21 MED ORDER — IRBESARTAN 150 MG PO TABS
150.0000 mg | ORAL_TABLET | Freq: Every day | ORAL | Status: DC
  Administered 2021-05-22: 150 mg via ORAL
  Filled 2021-05-21: qty 1

## 2021-05-21 MED ORDER — CARVEDILOL 6.25 MG PO TABS: 12.5000 mg | ORAL_TABLET | Freq: Every day | ORAL | Status: DC

## 2021-05-21 MED ORDER — IRBESARTAN 150 MG PO TABS
75.0000 mg | ORAL_TABLET | Freq: Once | ORAL | Status: AC
  Administered 2021-05-21: 75 mg via ORAL
  Filled 2021-05-21: qty 1

## 2021-05-21 NOTE — Plan of Care (Signed)
End of Shift Summary:  Alert and oriented x4. Elevated BP, received hydralazine x1. Other VSS on 3L via [place]. Up to bathroom with standby assist, urine output adequate. Remained free from falls or injury. Call bell within reach and able to use.   Problem: Clinical Measurements: Goal: Ability to maintain clinical measurements within normal limits will improve Outcome: Progressing Goal: Will remain free from infection Outcome: Progressing Goal: Diagnostic test results will improve Outcome: Progressing Goal: Respiratory complications will improve Outcome: Progressing Goal: Cardiovascular complication will be avoided Outcome: Progressing   Problem: Activity: Goal: Risk for activity intolerance will decrease Outcome: Progressing   Problem: Coping: Goal: Level of anxiety will decrease Outcome: Progressing   Problem: Safety: Goal: Ability to remain free from injury will improve Outcome: Progressing   Problem: Health Behavior/Discharge Planning: Goal: Ability to manage health-related needs will improve Outcome: Progressing

## 2021-05-21 NOTE — Progress Notes (Signed)
PROGRESS NOTE    Krystal Lee   JXB:[id number]  DOB: 06/17/1956  PCP: Armando Gang, FNP    DOA: 05/18/2021 LOS: 3   Assessment & Plan   Principal Problem:   COPD exacerbation (HCC) Active Problems:   Bipolar 1 disorder (HCC)   Depression   Anxiety   Hypertension   Sepsis (HCC)   HLD (hyperlipidemia)   GERD (gastroesophageal reflux disease)   Tobacco abuse   COPD with acute exacerbation with acute bronchitis -  Chronic respiratory failure with hypoxia - stable on baseline home 2L/min (typically uses at night and PRN) No pneumonia seen on CXR. Treated with IV Solu-medrol 40 mg BID --Transition to Prednisone 40 mg daily and taper down --Continue Zithromax to complete 5 days --Bronchodilators, Mucinex --Singular --IS and flutter valve for pulmonary hygiene --Follow up sputum culture --O2 as needed maintain sats 88-94%   Sepsis due to Acute Bronchitis - sepsis POA with tachycardia, tachypnea, leukocytosis.   Normal lactic acid.  No fever.   --given IV fluids, now off --Zithromax as above --Sepsis physiology improved  Hypertension - Reported hypotension at home, had recently been started on and/or increased dose of Diovan and up-titrating Verquvo per PCP.   Unable to see PCP records.   -Continue home Coreg.   -Held Lasix, Verquvo and Diovan on admission given significant hypotension at home  -7/2 resumed ARB (irbesartan on formulary) and increase dose as needed.  Resumed Lasix. 7/3 BP remains uncontrolled - increased irbesartan dose -PRN hydralazine -D/C Verquvo - this was new med, dose being up-titrated and pt had profound hypotension  Hx of chronic diastolic CHF - on prior echo in 2020.  Pt recently started on Verquvo by PCP which is indicated for HFrEF.  Pt reports serial echo's outpatient that are not available in Care Everywhere or [facility].  Pt reports planned referral to see Dr. Mariah Milling with cardiology. She seems well-compensated and euvolemic without edema,  CXR negative, BNP normal. --Echo with EF 55-60%, grade 1 diastolic dysfunction  --Continue Lasix, Coreg and ARB --D/C Verquvo   HLD - Crestor   GERD - Protonix  Bipolar 1 disorder, depression and anxiety -Continue home medications   Tobacco abuse - Counseled on importance of cessation. --Nicotine patch  Patient BMI: Body mass index is 30.9 kg/m.   DVT prophylaxis: enoxaparin (LOVENOX) injection 40 mg Start: 05/18/21 2200   Diet:  Diet Orders (From admission, onward)     Start     Ordered   05/18/21 0850  Diet Heart Room service appropriate? Yes; Fluid consistency: Thin  Diet effective now       Question Answer Comment  Room service appropriate? Yes   Fluid consistency: Thin   Na restriction, if any: 2 gm Na      05/18/21 0849              Code Status: Full Code   Brief Narrative / Hospital Course to Date:   65 y.o. female with medical history significant of hypertension, hyperlipidemia, COPD on 2.5 L oxygen, GERD, anxiety, bipolar, schizoaffective disorder, HCV, who presents with shortness of breath, progressively worsening over several days, with associated cough and chest tightness.  Admitted for management of acute exacerbation of COPD.   Subjective 05/21/21    Pt reports breathing slowly improving.  SOB when ambulating to bathroom is improved but not yet like her baseline.  Cough little better.  No fevers, chills, CP, SOB at rest.  Worries about her lungs and BP.  Disposition Plan & Communication   Status is: Inpatient  Remains inpatient appropriate because: titration of BP medications, BP uncontrolled requiring IV PRN's. Pt had profound hypotension at home on prior regimen.  Dispo: The patient is from: Home              Anticipated d/c is to: Home              Patient currently is not medically stable to d/c.   Difficult to place patient No   Family Communication: sister at bedside on rounds    Consults, Procedures, Significant Events    Consultants:  none  Procedures:  none  Antimicrobials:  Anti-infectives (From admission, onward)    Start     Dose/Rate Route Frequency Ordered Stop   05/19/21 1000  azithromycin (ZITHROMAX) tablet 250 mg       See Hyperspace for full Linked Orders Report.   250 mg Oral Daily 05/18/21 0850 05/23/21 0959   05/18/21 1000  azithromycin (ZITHROMAX) tablet 500 mg       See Hyperspace for full Linked Orders Report.   500 mg Oral Daily 05/18/21 0850 05/18/21 0922         Micro    Objective   Vitals:   05/21/21 0721 05/21/21 0818 05/21/21 1200 05/21/21 1539  BP:  (!) 154/77 (!) 157/85 (!) 142/72  Pulse:  62 64 67  Resp:  18 17 18   Temp:  (!) 97.5 F (36.4 C) 97.8 F (36.6 C) 97.9 F (36.6 C)  TempSrc:      SpO2: 99% 98% 98% 100%  Weight:      Height:        Intake/Output Summary (Last 24 hours) at 05/21/2021 1616 Last data filed at 05/21/2021 1022 Gross per 24 hour  Intake 240 ml  Output 600 ml  Net -360 ml   Filed Weights   05/19/21 1715  Weight: 69.4 kg    Physical Exam:  General exam: awake, alert, no acute distress Respiratory system: improving aeration today, not currently wheezing, on 3 L/min O2 [place] Cardiovascular system: normal S1/S2, RRR  Central nervous system: A&O x4. no gross focal neurologic deficits, normal speech GI: soft and non-tender Psychiatry: normal mood, congruent affect, judgement and insight appear normal  Labs   Data Reviewed: I have personally reviewed following labs and imaging studies  CBC: Recent Labs  Lab 05/17/21 2312 05/19/21 0720  WBC 11.0* 9.5  NEUTROABS 7.1  --   HGB 12.9 12.8  HCT 37.2 36.8  MCV 93.2 94.1  PLT 270 223   Basic Metabolic Panel: Recent Labs  Lab 05/17/21 2312 05/21/21 0552  NA 135 138  K 3.6 3.2*  CL 103 110  CO2 22 24  GLUCOSE 109* 97  BUN 13 24*  CREATININE 0.99 0.68  CALCIUM 8.9 8.4*  MG  --  1.9   GFR: Estimated Creatinine Clearance: 61 mL/min (by C-G formula based on SCr of 0.68  mg/dL). Liver Function Tests: No results for input(s): AST, ALT, ALKPHOS, BILITOT, PROT, ALBUMIN in the last 168 hours. No results for input(s): LIPASE, AMYLASE in the last 168 hours. No results for input(s): AMMONIA in the last 168 hours. Coagulation Profile: No results for input(s): INR, PROTIME in the last 168 hours. Cardiac Enzymes: No results for input(s): CKTOTAL, CKMB, CKMBINDEX, TROPONINI in the last 168 hours. BNP (last 3 results) No results for input(s): PROBNP in the last 8760 hours. HbA1C: No results for input(s): HGBA1C in the last 72 hours. CBG:  No results for input(s): GLUCAP in the last 168 hours. Lipid Profile: No results for input(s): CHOL, HDL, LDLCALC, TRIG, CHOLHDL, LDLDIRECT in the last 72 hours. Thyroid Function Tests: No results for input(s): TSH, T4TOTAL, FREET4, T3FREE, THYROIDAB in the last 72 hours. Anemia Panel: No results for input(s): VITAMINB12, FOLATE, FERRITIN, TIBC, IRON, RETICCTPCT in the last 72 hours. Sepsis Labs: Recent Labs  Lab 05/17/21 2312 05/18/21 1105  PROCALCITON <0.10  --   LATICACIDVEN  --  1.9    Recent Results (from the past 240 hour(s))  Resp Panel by RT-PCR (Flu A&B, Covid) Nasopharyngeal Swab     Status: None   Collection Time: 05/18/21  6:16 AM   Specimen: Nasopharyngeal Swab; Nasopharyngeal(NP) swabs in vial transport medium  Result Value Ref Range Status   SARS Coronavirus 2 by RT PCR NEGATIVE NEGATIVE Final    Comment: (NOTE) SARS-CoV-2 target nucleic acids are NOT DETECTED.  The SARS-CoV-2 RNA is generally detectable in upper respiratory specimens during the acute phase of infection. The lowest concentration of SARS-CoV-2 viral copies this assay can detect is 138 copies/mL. A negative result does not preclude SARS-Cov-2 infection and should not be used as the sole basis for treatment or other patient management decisions. A negative result may occur with  improper specimen collection/handling, submission of  specimen other than nasopharyngeal swab, presence of viral mutation(s) within the areas targeted by this assay, and inadequate number of viral copies(<138 copies/mL). A negative result must be combined with clinical observations, patient history, and epidemiological information. The expected result is Negative.  Fact Sheet for Patients:  BloggerCourse.com  Fact Sheet for Healthcare Providers:  SeriousBroker.it  This test is no t yet approved or cleared by the [place] FDA and  has been authorized for detection and/or diagnosis of SARS-CoV-2 by FDA under an Emergency Use Authorization (EUA). This EUA will remain  in effect (meaning this test can be used) for the duration of the COVID-19 declaration under Section 564(b)(1) of the Act, 21 U.S.C.section 360bbb-3(b)(1), unless the authorization is terminated  or revoked sooner.       Influenza A by PCR NEGATIVE NEGATIVE Final   Influenza B by PCR NEGATIVE NEGATIVE Final    Comment: (NOTE) The Xpert Xpress SARS-CoV-2/FLU/RSV plus assay is intended as an aid in the diagnosis of influenza from Nasopharyngeal swab specimens and should not be used as a sole basis for treatment. Nasal washings and aspirates are unacceptable for Xpert Xpress SARS-CoV-2/FLU/RSV testing.  Fact Sheet for Patients: BloggerCourse.com  Fact Sheet for Healthcare Providers: SeriousBroker.it  This test is not yet approved or cleared by the [place] FDA and has been authorized for detection and/or diagnosis of SARS-CoV-2 by FDA under an Emergency Use Authorization (EUA). This EUA will remain in effect (meaning this test can be used) for the duration of the COVID-19 declaration under Section 564(b)(1) of the Act, 21 U.S.C. section 360bbb-3(b)(1), unless the authorization is terminated or revoked.  Performed at [facility], [address], [place], [place] [address]       Imaging Studies   ECHOCARDIOGRAM COMPLETE  Result Date: 05/21/2021    ECHOCARDIOGRAM REPORT   Patient Name:   ANNALUCIA LAINO Mcgranahan Date of Exam: 05/20/2021 Medical Rec #:  [id number]         Height:       59.0 in Accession #:    [id number]        Weight:       153.0 lb Date of Birth:  01/29/1956         BSA:          1.646 m Patient Age:    65 years          BP:           180/83 mmHg Patient Gender: F                 HR:           69 bpm. Exam Location:  [facility] Procedure: 2D Echo and Strain Analysis Indications:     Dyspnea R06.00  History:         Patient has no prior history of Echocardiogram examinations.  Sonographer:     Overton Mam RDCS Referring Phys:  [id number] Tresa Endo A Rajanae Mantia Diagnosing Phys: Olga Millers MD  Sonographer Comments: Global longitudinal strain was attempted. IMPRESSIONS  1. Left ventricular ejection fraction, by estimation, is 55 to 60%. The left ventricle has normal function. The left ventricle has no regional wall motion abnormalities. Left ventricular diastolic parameters are consistent with Grade I diastolic dysfunction (impaired relaxation). Elevated left atrial pressure. The average left ventricular global longitudinal strain is -19.5 %. The global longitudinal strain is normal.  2. Right ventricular systolic function is normal. The right ventricular size is normal. Tricuspid regurgitation signal is inadequate for assessing PA pressure.  3. The mitral valve is normal in structure. Mild mitral valve regurgitation. No evidence of mitral stenosis.  4. The aortic valve is tricuspid. Aortic valve regurgitation is not visualized. No aortic stenosis is present.  5. The inferior vena cava is dilated in size with >50% respiratory variability, suggesting right atrial pressure of 8 mmHg. FINDINGS  Left Ventricle: Left ventricular ejection fraction, by estimation, is 55 to 60%. The left ventricle has normal function. The left ventricle has no regional wall  motion abnormalities. The average left ventricular global longitudinal strain is -19.5 %. The global longitudinal strain is normal. The left ventricular internal cavity size was normal in size. There is no left ventricular hypertrophy. Left ventricular diastolic parameters are consistent with Grade I diastolic dysfunction (impaired relaxation). Elevated left atrial pressure. Right Ventricle: The right ventricular size is normal.Right ventricular systolic function is normal. Tricuspid regurgitation signal is inadequate for assessing PA pressure. Left Atrium: Left atrial size was normal in size. Right Atrium: Right atrial size was normal in size. Pericardium: Trivial pericardial effusion is present. Mitral Valve: The mitral valve is normal in structure. Mild mitral valve regurgitation. No evidence of mitral valve stenosis. Tricuspid Valve: The tricuspid valve is normal in structure. Tricuspid valve regurgitation is trivial. No evidence of tricuspid stenosis. Aortic Valve: The aortic valve is tricuspid. Aortic valve regurgitation is not visualized. No aortic stenosis is present. Aortic valve peak gradient measures 4.8 mmHg. Pulmonic Valve: The pulmonic valve was normal in structure. Pulmonic valve regurgitation is not visualized. No evidence of pulmonic stenosis. Aorta: The aortic root is normal in size and structure. Venous: The inferior vena cava is dilated in size with greater than 50% respiratory variability, suggesting right atrial pressure of 8 mmHg. IAS/Shunts: No atrial level shunt detected by color flow Doppler.  LEFT VENTRICLE PLAX 2D LVIDd:         4.74 cm  Diastology LVIDs:         3.29 cm  LV e' medial:    5.11 cm/s LV PW:         1.05 cm  LV E/e' medial:  20.7 LV IVS:  1.16 cm  LV e' lateral:   7.18 cm/s LVOT diam:     1.80 cm  LV E/e' lateral: 14.8 LV SV:         52 LV SV Index:   32       2D Longitudinal Strain LVOT Area:     2.54 cm 2D Strain GLS Avg:     -19.5 %  RIGHT VENTRICLE RV Basal diam:   2.26 cm RV S prime:     10.80 cm/s TAPSE (M-mode): 1.6 cm LEFT ATRIUM             Index       RIGHT ATRIUM          Index LA diam:        2.80 cm 1.70 cm/m  RA Area:     8.67 cm LA Vol (A2C):   18.8 ml 11.42 ml/m RA Volume:   16.50 ml 10.02 ml/m LA Vol (A4C):   18.7 ml 11.36 ml/m LA Biplane Vol: 20.7 ml 12.58 ml/m  AORTIC VALVE                PULMONIC VALVE AV Area (Vmax): 2.16 cm    PV Vmax:       0.84 m/s AV Vmax:        109.00 cm/s PV Peak grad:  2.8 mmHg AV Peak Grad:   4.8 mmHg LVOT Vmax:      92.70 cm/s LVOT Vmean:     56.000 cm/s LVOT VTI:       0.205 m  AORTA Ao Root diam: 2.80 cm Ao Asc diam:  3.00 cm MITRAL VALVE MV Area (PHT): 3.56 cm     SHUNTS MV Decel Time: 213 msec     Systemic VTI:  0.20 m MV E velocity: 106.00 cm/s  Systemic Diam: 1.80 cm MV A velocity: 99.00 cm/s MV E/A ratio:  1.07 Olga MillersBrian Crenshaw MD Electronically signed by Olga MillersBrian Crenshaw MD Signature Date/Time: 05/21/2021/9:00:42 AM    Final      Medications   Scheduled Meds:  azithromycin  250 mg Oral Daily   [START ON 05/22/2021] carvedilol  6.25 mg Oral Daily   dextromethorphan-guaiFENesin  1 tablet Oral BID   enoxaparin (LOVENOX) injection  40 mg Subcutaneous Q24H   estrogens (conjugated)  0.3 mg Oral Daily   fluticasone furoate-vilanterol  1 puff Inhalation Daily   furosemide  20 mg Oral Daily   ipratropium-albuterol  3 mL Nebulization Q6H   [START ON 05/22/2021] irbesartan  150 mg Oral Daily   montelukast  10 mg Oral Daily   nicotine  21 mg Transdermal Daily   pantoprazole  40 mg Oral BID   [START ON 05/22/2021] predniSONE  40 mg Oral Q breakfast   progesterone  100 mg Oral QHS   QUEtiapine  300 mg Oral QHS   roflumilast  500 mcg Oral Daily   rosuvastatin  40 mg Oral QHS   Continuous Infusions:     LOS: 3 days    Time spent: 25 minutes with > 50% spent on coordination of care and at bedside.    Pennie BanterKelly A Jalen Daluz, DO [facility]  05/21/2021, 4:16 PM      If 7PM-7AM, please contact  night-coverage. How to contact the [facility] Attending or Consulting provider 7A - 7P or covering provider during after hours 7P -7A, for this patient?    Check the care team in [facility] and look for a) attending/consulting [facility] provider listed and b) the [facility] team  listed Log into [email or web address] and use 's universal password to access. If you do not have the password, please contact the hospital operator. Locate the [facility] provider you are looking for under [facility] and page to a number that you can be directly reached. If you still have difficulty reaching the provider, please page the [facility] (Director on Call) for the Hospitalists listed on amion for assistance.

## 2021-05-21 NOTE — Plan of Care (Signed)
  Problem: Education: Goal: Knowledge of General Education information will improve Description: Including pain rating scale, medication(s)/side effects and non-pharmacologic comfort measures Outcome: Progressing   Problem: Clinical Measurements: Goal: Respiratory complications will improve Outcome: Progressing   Problem: Activity: Goal: Risk for activity intolerance will decrease Outcome: Progressing   Problem: Coping: Goal: Level of anxiety will decrease Outcome: Progressing   Problem: Pain Managment: Goal: General experience of comfort will improve Outcome: Progressing   Problem: Safety: Goal: Ability to remain free from injury will improve Outcome: Progressing   Problem: Skin Integrity: Goal: Risk for impaired skin integrity will decrease Outcome: Progressing   

## 2021-05-22 LAB — BASIC METABOLIC PANEL
Anion gap: 9 (ref 5–15)
BUN: 26 mg/dL — ABNORMAL HIGH (ref 8–23)
CO2: 20 mmol/L — ABNORMAL LOW (ref 22–32)
Calcium: 8 mg/dL — ABNORMAL LOW (ref 8.9–10.3)
Chloride: 107 mmol/L (ref 98–111)
Creatinine, Ser: 0.87 mg/dL (ref 0.44–1.00)
GFR, Estimated: >60 mL/min (ref >60)
Glucose, Bld: 76 mg/dL (ref 70–99)
Potassium: 3.5 mmol/L (ref 3.5–5.1)
Sodium: 136 mmol/L (ref 135–145)

## 2021-05-22 MED ORDER — VALSARTAN 80 MG PO TABS: 80.0000 mg | ORAL_TABLET | Freq: Every day | ORAL | 0 refills | Status: DC

## 2021-05-22 MED ORDER — PREDNISONE 10 MG PO TABS: ORAL_TABLET | ORAL | 0 refills | Status: AC

## 2021-05-22 NOTE — Progress Notes (Signed)
Pt did not ambulate at the scheduled time however, she ambulated from bed to BR and back with supervision. She had steady gait. She was asleep during midnight vitals, thus vitals not done for MN.

## 2021-05-22 NOTE — Discharge Summary (Addendum)
Physician Discharge Summary  Krystal Lee ZHY:[id number] DOB: October 06, 1956 DOA: 05/18/2021  PCP: Armando Gang, FNP  Admit date: 05/18/2021 Discharge date: 05/22/2021  Admitted From: home Disposition:  home  Recommendations for Outpatient Follow-up:  Follow up with PCP in 1-2 weeks Please obtain BMP/CBC in one week Follow up on patient's BP medication regimen given changes this admission.   Home Health: None  Equipment/Devices: none   Discharge Condition: stable  CODE STATUS: full  Diet recommendation:  Heart Healthy     Discharge Diagnoses: Principal Problem:   COPD exacerbation (HCC) Active Problems:   Bipolar 1 disorder (HCC)   Depression   Anxiety   Hypertension   Sepsis (HCC)   HLD (hyperlipidemia)   GERD (gastroesophageal reflux disease)   Tobacco abuse    Summary of HPI and Hospital Course:  65 y.o. female with medical history significant of hypertension, hyperlipidemia, COPD on 2.5 L oxygen, GERD, anxiety, bipolar, schizoaffective disorder, HCV, who presents with shortness of breath, progressively worsening over several days, with associated cough and chest tightness.  Admitted for management of acute exacerbation of COPD.     Discharge day: Pred taper 30x2, 20x2, 10 x2days  Valsartan decreased 80 bid to daily Coreg dose same 6.125 Lasix same dose 20 daily  Patient feeling much better and BP's improved. Stable for discharge home today with close PCP follow up.    COPD with acute exacerbation with acute bronchitis -  Chronic respiratory failure with hypoxia - stable on baseline home 2L/min (typically uses at night and PRN) No pneumonia seen on CXR. Treated with IV Solu-medrol 40 mg BID --Transitioned to Prednisone 40 mg daily and taper at d/c. --Zithromax x 5 days --Bronchodilators, Mucinex --Singular --IS and flutter valve for pulmonary hygiene --Sputum culture not obtained --O2 as needed maintain sats 88-94%   Sepsis due to Acute  Bronchitis - sepsis POA with tachycardia, tachypnea, leukocytosis.  Normal lactic acid.  No fever.   Sepsis physiology improved --given IV fluids --Zithromax 5 days   Hypertension - Reported hypotension at home just PTA, had recently been started on and/or increased dose of Diovan and up-titrating Verquvo per PCP.   Unable to see PCP records.   -Continue home Coreg.   -Held Lasix, Verquvo and Diovan on admission given significant hypotension at home -7/2 resumed ARB (irbesartan on formulary) and increase dose as needed.  Resumed Lasix. 7/3 BP remains uncontrolled - increased irbesartan dose -PRN hydralazine -D/C Verquvo - this was new med, dose being up-titrated and pt had profound hypotension Discharge BP meds: Coreg, valsartan, Lasix   Hx of chronic diastolic CHF - on prior echo in 2020.  Pt recently started on Verquvo by PCP which is indicated for HFrEF.  Pt reports serial echo's outpatient that are not available in Care Everywhere or [facility].  Pt reports planned referral to see Dr. Mariah Milling with cardiology. She seems well-compensated and euvolemic without edema, CXR negative, BNP normal. --Echo with EF 55-60%, grade 1 diastolic dysfunction  --Continue Lasix, Coreg and ARB --D/C Verquvo - d/w cardiology - rarely seen used and indicated for reduced EF which pt does not have.   HLD - Crestor   GERD - Protonix   Bipolar 1 disorder, depression and anxiety ?Schizoaffective disorder (unable to find psych records) These are chronic and stable.  Follow up with psychiatry. -Continue prior home medications   Tobacco abuse - Counseled on importance of cessation. --Nicotine patch PCP follow up for ongoing cessation support.   ObesityI: Body mass index  is 30.9 kg/m.  Complicates overall care and prognosis.  Recommend lifestyle modifications including physical activity and diet for weight loss and overall long-term health.    Discharge Instructions   Discharge Instructions     (HEART FAILURE  PATIENTS) Call MD:  Anytime you have any of the following symptoms: 1) 3 pound weight gain in 24 hours or 5 pounds in 1 week 2) shortness of breath, with or without a dry hacking cough 3) swelling in the hands, feet or stomach 4) if you have to sleep on extra pillows at night in order to breathe.   Complete by: As directed    Call MD for:  extreme fatigue   Complete by: As directed    Call MD for:  persistant dizziness or light-headedness   Complete by: As directed    Call MD for:  persistant nausea and vomiting   Complete by: As directed    Call MD for:  severe uncontrolled pain   Complete by: As directed    Call MD for:  temperature >100.4   Complete by: As directed    Diet - low sodium heart healthy   Complete by: As directed    Discharge instructions   Complete by: As directed    For COPD flare - I sent a short prednisone taper to your pharmacy. Take 30 mg x 2 days, then 20 mg x 2 days, then 10 mg x 2 days.  Continue to work on stopping smoking - you're doing great so far. Your primary care doctor might consider medication to help with that. Wellbutrin is an option, but talk to your doctor about it, if you think it would be helpful.  BLOOD PRESSURE -  Please check your BP at home twice each day, just before taking BP medications. So before taking Coreg (carvedilol), Diovan (valsartan), Lasix (furosemide) - check BP.  If Top BP number is less than 110 or Bottom BP number is less than 60 - HOLD those medications until the next dose is due.    Write down your BP readings, and also if/when you have to hold your medications.  Bring this log to follow up doctor visits to help with medication adjustments if needed.  We stopped Vercuvo - this medication is for heart failure with "weak pump" (low ejection fraction) - and yours is normal on Echocardiogram we did here in the hospital.  This may have also caused your BP to drop so low at home.  I've included Dr. Mariah Milling (cardiologist)  contact information for his office.  Recommend follow up with him for your heart and blood pressure.   Increase activity slowly   Complete by: As directed       Allergies as of 05/22/2021   No Known Allergies      Medication List     STOP taking these medications    chlorpheniramine-HYDROcodone 10-8 MG/5ML Suer Commonly known as: Tussionex Pennkinetic ER   clotrimazole 10 MG troche Commonly known as: MYCELEX   medroxyPROGESTERone 2.5 MG tablet Commonly known as: PROVERA   predniSONE 10 MG (21) Tbpk tablet Commonly known as: STERAPRED UNI-PAK 21 TAB   VERQUVO PO       TAKE these medications    albuterol (2.5 MG/3ML) 0.083% nebulizer solution Commonly known as: PROVENTIL Take 2.5 mg by nebulization every 6 (six) hours as needed for wheezing or shortness of breath. What changed: Another medication with the same name was removed. Continue taking this medication, and follow the directions you see  here.   ALPRAZolam 0.5 MG tablet Commonly known as: XANAX Take 0.5 mg by mouth 3 (three) times daily as needed for anxiety.   carvedilol 6.25 MG tablet Commonly known as: COREG Take 6.25 mg by mouth daily as needed.   estrogens (conjugated) 0.3 MG tablet Commonly known as: PREMARIN Take 0.3 mg by mouth See admin instructions. Take daily for 25 days then do not take for 5 days.   furosemide 20 MG tablet Commonly known as: LASIX Take 20 mg by mouth daily. What changed: Another medication with the same name was removed. Continue taking this medication, and follow the directions you see here.   ibuprofen 800 MG tablet Commonly known as: ADVIL Take 800 mg by mouth every 8 (eight) hours as needed for mild pain or moderate pain.   montelukast 10 MG tablet Commonly known as: SINGULAIR Take 10 mg by mouth daily.   pantoprazole 40 MG tablet Commonly known as: PROTONIX Take 40 mg by mouth 2 (two) times daily.   progesterone 100 MG capsule Commonly known as:  PROMETRIUM Take 100 mg by mouth at bedtime.   QUEtiapine 300 MG tablet Commonly known as: SEROQUEL Take 300 mg by mouth at bedtime.   roflumilast 500 MCG Tabs tablet Commonly known as: DALIRESP Take 500 mcg by mouth daily.   rosuvastatin 40 MG tablet Commonly known as: CRESTOR Take 40 mg by mouth at bedtime.   Trelegy Ellipta 100-62.5-25 MCG/INH Aepb Generic drug: Fluticasone-Umeclidin-Vilant Inhale 1 puff into the lungs daily.   valsartan 80 MG tablet Commonly known as: DIOVAN Take 1 tablet (80 mg total) by mouth daily. What changed: when to take this       ASK your doctor about these medications    predniSONE 10 MG tablet Commonly known as: DELTASONE Take 3 tablets (30 mg total) by mouth daily for 2 days, THEN 2 tablets (20 mg total) daily for 2 days, THEN 1 tablet (10 mg total) daily for 2 days. Start taking on: May 22, 2021 Ask about: Should I take this medication?        Follow-up Information     Armando GangLindley, Cheryl P, FNP. Schedule an appointment as soon as possible for a visit in 1 week(s).   Specialty: Family Medicine Why: Hospital follow up Contact information: [address] [place] [address] [phone]         Antonieta IbaGollan, Timothy J, MD. Schedule an appointment as soon as possible for a visit in 1 week(s).   Specialty: Cardiology Why: Referral for diastolic CHF and hypertension Contact information: [address] [place] [address] [phone]                No Known Allergies   If you experience worsening of your admission symptoms, develop shortness of breath, life threatening emergency, suicidal or homicidal thoughts you must seek medical attention immediately by calling 911 or calling your MD immediately  if symptoms less severe.    Please note   You were cared for by a hospitalist during your hospital stay. If you have any questions about your discharge medications or the care you received while you were in  the hospital after you are discharged, you can call the unit and asked to speak with the hospitalist on call if the hospitalist that took care of you is not available. Once you are discharged, your primary care physician will handle any further medical issues. Please note that NO REFILLS for any discharge medications will be authorized once  you are discharged, as it is imperative that you return to your primary care physician (or establish a relationship with a primary care physician if you do not have one) for your aftercare needs so that they can reassess your need for medications and monitor your lab values.   Consultations: none    Procedures/Studies: DG Chest 2 View  Result Date: 05/17/2021 CLINICAL DATA:  Shortness of breath EXAM: CHEST - 2 VIEW COMPARISON:  02/25/2020 FINDINGS: Heart and mediastinal contours are within normal limits. No focal opacities or effusions. No acute bony abnormality. IMPRESSION: No active cardiopulmonary disease. Electronically Signed   By: Charlett Nose M.D.   On: 05/17/2021 23:28   ECHOCARDIOGRAM COMPLETE  Result Date: 05/21/2021    ECHOCARDIOGRAM REPORT   Patient Name:   NAKYRA BOURN Fritcher Date of Exam: 05/20/2021 Medical Rec #:  [id number]         Height:       59.0 in Accession #:    [id number]        Weight:       153.0 lb Date of Birth:  [birth year]         BSA:          1.646 m Patient Age:    65 years          BP:           180/83 mmHg Patient Gender: F                 HR:           69 bpm. Exam Location:  [facility] Procedure: 2D Echo and Strain Analysis Indications:     Dyspnea R06.00  History:         Patient has no prior history of Echocardiogram examinations.  Sonographer:     Overton Mam RDCS Referring Phys:  [id number] Tresa Endo A Rain Friedt Diagnosing Phys: Olga Millers MD  Sonographer Comments: Global longitudinal strain was attempted. IMPRESSIONS  1. Left ventricular ejection fraction, by estimation, is 55 to 60%. The left ventricle has normal function. The left  ventricle has no regional wall motion abnormalities. Left ventricular diastolic parameters are consistent with Grade I diastolic dysfunction (impaired relaxation). Elevated left atrial pressure. The average left ventricular global longitudinal strain is -19.5 %. The global longitudinal strain is normal.  2. Right ventricular systolic function is normal. The right ventricular size is normal. Tricuspid regurgitation signal is inadequate for assessing PA pressure.  3. The mitral valve is normal in structure. Mild mitral valve regurgitation. No evidence of mitral stenosis.  4. The aortic valve is tricuspid. Aortic valve regurgitation is not visualized. No aortic stenosis is present.  5. The inferior vena cava is dilated in size with >50% respiratory variability, suggesting right atrial pressure of 8 mmHg. FINDINGS  Left Ventricle: Left ventricular ejection fraction, by estimation, is 55 to 60%. The left ventricle has normal function. The left ventricle has no regional wall motion abnormalities. The average left ventricular global longitudinal strain is -19.5 %. The global longitudinal strain is normal. The left ventricular internal cavity size was normal in size. There is no left ventricular hypertrophy. Left ventricular diastolic parameters are consistent with Grade I diastolic dysfunction (impaired relaxation). Elevated left atrial pressure. Right Ventricle: The right ventricular size is normal.Right ventricular systolic function is normal. Tricuspid regurgitation signal is inadequate for assessing PA pressure. Left Atrium: Left atrial size was normal in size. Right Atrium: Right atrial size was normal in size. Pericardium: Trivial pericardial effusion is  present. Mitral Valve: The mitral valve is normal in structure. Mild mitral valve regurgitation. No evidence of mitral valve stenosis. Tricuspid Valve: The tricuspid valve is normal in structure. Tricuspid valve regurgitation is trivial. No evidence of tricuspid  stenosis. Aortic Valve: The aortic valve is tricuspid. Aortic valve regurgitation is not visualized. No aortic stenosis is present. Aortic valve peak gradient measures 4.8 mmHg. Pulmonic Valve: The pulmonic valve was normal in structure. Pulmonic valve regurgitation is not visualized. No evidence of pulmonic stenosis. Aorta: The aortic root is normal in size and structure. Venous: The inferior vena cava is dilated in size with greater than 50% respiratory variability, suggesting right atrial pressure of 8 mmHg. IAS/Shunts: No atrial level shunt detected by color flow Doppler.  LEFT VENTRICLE PLAX 2D LVIDd:         4.74 cm  Diastology LVIDs:         3.29 cm  LV e' medial:    5.11 cm/s LV PW:         1.05 cm  LV E/e' medial:  20.7 LV IVS:        1.16 cm  LV e' lateral:   7.18 cm/s LVOT diam:     1.80 cm  LV E/e' lateral: 14.8 LV SV:         52 LV SV Index:   32       2D Longitudinal Strain LVOT Area:     2.54 cm 2D Strain GLS Avg:     -19.5 %  RIGHT VENTRICLE RV Basal diam:  2.26 cm RV S prime:     10.80 cm/s TAPSE (M-mode): 1.6 cm LEFT ATRIUM             Index       RIGHT ATRIUM          Index LA diam:        2.80 cm 1.70 cm/m  RA Area:     8.67 cm LA Vol (A2C):   18.8 ml 11.42 ml/m RA Volume:   16.50 ml 10.02 ml/m LA Vol (A4C):   18.7 ml 11.36 ml/m LA Biplane Vol: 20.7 ml 12.58 ml/m  AORTIC VALVE                PULMONIC VALVE AV Area (Vmax): 2.16 cm    PV Vmax:       0.84 m/s AV Vmax:        109.00 cm/s PV Peak grad:  2.8 mmHg AV Peak Grad:   4.8 mmHg LVOT Vmax:      92.70 cm/s LVOT Vmean:     56.000 cm/s LVOT VTI:       0.205 m  AORTA Ao Root diam: 2.80 cm Ao Asc diam:  3.00 cm MITRAL VALVE MV Area (PHT): 3.56 cm     SHUNTS MV Decel Time: 213 msec     Systemic VTI:  0.20 m MV E velocity: 106.00 cm/s  Systemic Diam: 1.80 cm MV A velocity: 99.00 cm/s MV E/A ratio:  1.07 Olga Millers MD Electronically signed by Olga Millers MD Signature Date/Time: 05/21/2021/9:00:42 AM    Final         Subjective: Pt  feeling better this AM.  No wheezing and DOE much improved, near baseline.  No fever chills.  Discussed BP monitoring at home until follow up with PCP and pt agreeable.   Discharge Exam: Vitals:   05/22/21 0438 05/22/21 0835  BP: 106/68 103/69  Pulse: 72 67  Resp: 15 16  Temp: 97.6 F (36.4 C) (!) 97.5 F (36.4 C)  SpO2: 100% 100%   Vitals:   05/21/21 1539 05/21/21 2034 05/22/21 0438 05/22/21 0835  BP: (!) 142/72 136/67 106/68 103/69  Pulse: 67 75 72 67  Resp: 18  15 16   Temp: 97.9 F (36.6 C) (!) 97.5 F (36.4 C) 97.6 F (36.4 C) (!) 97.5 F (36.4 C)  TempSrc:      SpO2: 100% 99% 100% 100%  Weight:      Height:        General: Pt is alert, awake, not in acute distress Cardiovascular: RRR, S1/S2 +, no rubs, no gallops Respiratory: CTA bilaterally with overall diminished breath sounds but no wheezing, no rhonchi, normal respiratory effort at rest Abdominal: Soft, NT, ND, bowel sounds + Extremities: no edema, no cyanosis    The results of significant diagnostics from this hospitalization (including imaging, microbiology, ancillary and laboratory) are listed below for reference.     Microbiology: No results found for this or any previous visit (from the past 240 hour(s)).    Labs: BNP (last 3 results) Recent Labs    05/17/21 2312  BNP 20.8   Basic Metabolic Panel: No results for input(s): NA, K, CL, CO2, GLUCOSE, BUN, CREATININE, CALCIUM, MG, PHOS in the last 168 hours.  Liver Function Tests: No results for input(s): AST, ALT, ALKPHOS, BILITOT, PROT, ALBUMIN in the last 168 hours. No results for input(s): LIPASE, AMYLASE in the last 168 hours. No results for input(s): AMMONIA in the last 168 hours. CBC: No results for input(s): WBC, NEUTROABS, HGB, HCT, MCV, PLT in the last 168 hours.  Cardiac Enzymes: No results for input(s): CKTOTAL, CKMB, CKMBINDEX, TROPONINI in the last 168 hours. BNP: Invalid input(s): POCBNP CBG: No results for input(s): GLUCAP in  the last 168 hours. D-Dimer No results for input(s): DDIMER in the last 72 hours. Hgb A1c No results for input(s): HGBA1C in the last 72 hours. Lipid Profile No results for input(s): CHOL, HDL, LDLCALC, TRIG, CHOLHDL, LDLDIRECT in the last 72 hours. Thyroid function studies No results for input(s): TSH, T4TOTAL, T3FREE, THYROIDAB in the last 72 hours.  Invalid input(s): FREET3 Anemia work up No results for input(s): VITAMINB12, FOLATE, FERRITIN, TIBC, IRON, RETICCTPCT in the last 72 hours. Urinalysis    Component Value Date/Time   COLORURINE STRAW (A) 03/17/2018 0055   APPEARANCEUR CLEAR (A) 03/17/2018 0055   APPEARANCEUR Clear 10/29/2013 2004   LABSPEC 1.004 (L) 03/17/2018 0055   LABSPEC 1.004 10/29/2013 2004   PHURINE 6.0 03/17/2018 0055   GLUCOSEU NEGATIVE 03/17/2018 0055   GLUCOSEU Negative 10/29/2013 2004   HGBUR NEGATIVE 03/17/2018 0055   BILIRUBINUR NEGATIVE 03/17/2018 0055   BILIRUBINUR Negative 10/29/2013 2004   KETONESUR NEGATIVE 03/17/2018 0055   PROTEINUR NEGATIVE 03/17/2018 0055   UROBILINOGEN 1.0 11/07/2013 1053   NITRITE NEGATIVE 03/17/2018 0055   LEUKOCYTESUR NEGATIVE 03/17/2018 0055   LEUKOCYTESUR Negative 10/29/2013 2004   Sepsis Labs Invalid input(s): PROCALCITONIN,  WBC,  LACTICIDVEN Microbiology No results found for this or any previous visit (from the past 240 hour(s)).    Time coordinating discharge: Over 30 minutes  SIGNED:   14/09/2013, DO [facility] 06/04/2021, 5:13 PM   If 7PM-7AM, please contact night-coverage [email or web address]

## 2021-05-22 NOTE — Care Management Important Message (Signed)
Important Message  Patient Details  Name: Krystal Lee MRN: [id number] Date of Birth: 1956/03/28   Medicare Important Message Given:  Yes     Krystal Lee 05/22/2021, 11:01 AM

## 2021-07-10 ENCOUNTER — Other Ambulatory Visit: Payer: Self-pay

## 2021-07-10 ENCOUNTER — Encounter: Payer: Self-pay | Admitting: Cardiovascular Disease

## 2021-07-10 ENCOUNTER — Ambulatory Visit: Payer: Medicare HMO | Admitting: Cardiovascular Disease

## 2021-07-10 VITALS — BP 168/94 | HR 86 | Ht 59.0 in | Wt 170.0 lb

## 2021-07-10 DIAGNOSIS — I10 Essential (primary) hypertension: Secondary | ICD-10-CM

## 2021-07-10 MED ORDER — CARVEDILOL 6.25 MG PO TABS: 6.2500 mg | ORAL_TABLET | Freq: Two times a day (BID) | ORAL | Status: DC

## 2021-07-10 MED ORDER — VALSARTAN 80 MG PO TABS: 80.0000 mg | ORAL_TABLET | Freq: Every day | ORAL | 0 refills | Status: DC

## 2021-07-10 MED ORDER — FUROSEMIDE 20 MG PO TABS: 20.0000 mg | ORAL_TABLET | Freq: Every day | ORAL | Status: DC

## 2021-07-10 NOTE — Patient Instructions (Addendum)
Medication Instructions:  Please restart lasix, valsartan and coreg  If you need a refill on your cardiac medications before your next appointment, please call your pharmacy.    Lab work: No new labs needed   If you have labs (blood work) drawn today and your tests are completely normal, you will receive your results only by: MyChart Message (if you have MyChart) OR A paper copy in the mail If you have any lab test that is abnormal or we need to change your treatment, we will call you to review the results.   Testing/Procedures: No new testing needed   Follow-Up: At [facility], you and your health needs are our priority.  As part of our continuing mission to provide you with exceptional heart care, we have created designated Provider Care Teams.  These Care Teams include your primary Cardiologist (physician) and Advanced Practice Providers (APPs -  Physician Assistants and Nurse Practitioners) who all work together to provide you with the care you need, when you need it.  You will need a follow up appointment as needed  Providers on your designated Care Team:   Nicolasa Ducking, NP Eula Listen, PA-C Marisue Ivan, PA-C Cadence Fransico Michael, [place]  Any Other Special Instructions Will Be Listed Below (If Applicable).  COVID-19 Vaccine Information can be found at: [email or web address] For questions related to vaccine distribution or appointments, please email [email or web address] or call [phone].

## 2021-07-10 NOTE — Progress Notes (Signed)
Cardiology Office Note  Date:  07/10/2021   ID:  Krystal, Lee 03-Feb-1956, MRN [id number]  PCP:  Armando Gang, FNP   Chief Complaint  Patient presents with   New Patient (Initial Visit)    Ref by Franco Nones, NP for abnormal Echo & HTN. Patient c/o LE edema, shortness of breath with little to no exertion & rapid heartbeats. Medications reviewed by the patient verbally.     HPI:  [name] is a 65 year old woman with past medical history of Smoking COPD HTN bipolar Recent hospitalization for COPD exacerbation Who presents by referral from Franco Nones for consultation of her hypertension, abnormal echocardiogram   In hospital 05/18/2021: copd exacerbation Discharge BP meds: Coreg, valsartan, Lasix On her visit today she is not taking the medications detailed above Thinks the medications are too much, she is not checking her blood pressure at home  At home she is on oxygen, "does not use it much" Uses it when "sick" Does not use it at night Has a neb machine On inhaler trelegy Reports having chronic shortness of breath worse with exertion  Quit smoking 2 months ago Does not use patches but has them  Echo 05/20/2021 Normal  EF  55 to 60%  Echocardiogram June 2022 done through primary care ejection fraction 50%  EKG personally reviewed by myself on todays visit NSR rate 86 no ST or T wave changes   PMH:   has a past medical history of Anxiety, Arthritis, Bipolar 1 disorder (HCC), COPD (chronic obstructive pulmonary disease) (HCC), Depression, GERD (gastroesophageal reflux disease), Headache, Hepatitis C virus, Hypertension, Post-menopausal, and Schizoaffective disorder (HCC).  PSH:    Past Surgical History:  Procedure Laterality Date   CATARACT EXTRACTION  2010   right    CATARACT EXTRACTION W/PHACO Left 06/09/2019   Procedure: CATARACT EXTRACTION PHACO AND INTRAOCULAR LENS PLACEMENT (IOC) COMPLICATED  LEFT;  Surgeon: [name], MD;   Location: [facility];  Service: Ophthalmology;  Laterality: Left;  VISION BLUE  [place]   LOWER EXTREMITY ANGIOGRAPHY Bilateral 08/31/2013   [facility], Dr. [name]    Current Outpatient Medications  Medication Sig Dispense Refill   albuterol (PROVENTIL) (2.5 MG/3ML) 0.083% nebulizer solution Take 2.5 mg by nebulization every 6 (six) hours as needed for wheezing or shortness of breath.     ALPRAZolam (XANAX) 0.5 MG tablet Take 0.5 mg by mouth 3 (three) times daily as needed for anxiety.     estrogens, conjugated, (PREMARIN) 0.3 MG tablet Take 0.3 mg by mouth See admin instructions. Take daily for 25 days then do not take for 5 days.     montelukast (SINGULAIR) 10 MG tablet Take 10 mg by mouth daily.     pantoprazole (PROTONIX) 40 MG tablet Take 40 mg by mouth 2 (two) times daily.     progesterone (PROMETRIUM) 100 MG capsule Take 100 mg by mouth at bedtime.     QUEtiapine (SEROQUEL) 300 MG tablet Take 300 mg by mouth at bedtime.     roflumilast (DALIRESP) 500 MCG TABS tablet Take 500 mcg by mouth daily.     rosuvastatin (CRESTOR) 40 MG tablet Take 40 mg by mouth at bedtime.     TRELEGY ELLIPTA 100-62.5-25 MCG/INH AEPB Inhale 1 puff into the lungs daily.     carvedilol (COREG) 6.25 MG tablet Take 1 tablet (6.25 mg total) by mouth 2 (two) times daily with a meal.     furosemide (LASIX) 20 MG tablet Take 1 tablet (20 mg total)  by mouth daily. 30 tablet    ibuprofen (ADVIL,MOTRIN) 800 MG tablet Take 800 mg by mouth every 8 (eight) hours as needed for mild pain or moderate pain.  (Patient not taking: Reported on 07/10/2021)     valsartan (DIOVAN) 80 MG tablet Take 1 tablet (80 mg total) by mouth daily. 30 tablet 0   No current facility-administered medications for this visit.     Allergies:   Patient has no known allergies.   Social History:  The patient  reports that she quit smoking about 2 months ago. Her smoking use included cigarettes. She has a 45.00 pack-year smoking history. She has  never used smokeless tobacco. She reports that she does not currently use alcohol after a past usage of about 1.0 standard drink per week. She reports that she does not use drugs.   Family History:   family history includes Bone cancer in her paternal grandmother; Breast cancer in her paternal grandmother; Heart disease in her father and paternal uncle; Pancreatic cancer in her mother; Stroke in her paternal grandfather.    Review of Systems: Review of Systems  Constitutional: Negative.   HENT: Negative.    Respiratory: Negative.    Cardiovascular: Negative.   Gastrointestinal: Negative.   Musculoskeletal: Negative.   Neurological: Negative.   Psychiatric/Behavioral: Negative.    All other systems reviewed and are negative.   PHYSICAL EXAM: VS:  BP (!) 168/94 (BP Location: Right Arm, Patient Position: Sitting, Cuff Size: Normal)   Pulse 86   Ht 4\' 11"  (1.499 m)   Wt 170 lb (77.1 kg)   SpO2 97%   BMI 34.34 kg/m  , BMI Body mass index is 34.34 kg/m. GEN: Well nourished, well developed, in no acute distress HEENT: normal Neck: no JVD, carotid bruits, or masses Cardiac: RRR; no murmurs, rubs, or gallops,no edema  Respiratory:  clear to auscultation bilaterally, normal work of breathing GI: soft, nontender, nondistended, + BS MS: no deformity or atrophy Skin: warm and dry, no rash Neuro:  Strength and sensation are intact Psych: euthymic mood, full affect  Recent Labs: 05/17/2021: B Natriuretic Peptide 20.8 05/19/2021: Hemoglobin 12.8; Platelets 223 05/21/2021: Magnesium 1.9 05/22/2021: BUN 26; Creatinine, Ser 0.87; Potassium 3.5; Sodium 136    Lipid Panel No results found for: CHOL, HDL, LDLCALC, TRIG    Wt Readings from Last 3 Encounters:  07/10/21 170 lb (77.1 kg)  05/19/21 153 lb (69.4 kg)  11/25/20 153 lb (69.4 kg)     ASSESSMENT AND PLAN:  Problem List Items Addressed This Visit       Cardiology Problems   Hypertension - Primary   Relevant Medications    furosemide (LASIX) 20 MG tablet   carvedilol (COREG) 6.25 MG tablet   valsartan (DIOVAN) 80 MG tablet   Other Relevant Orders   EKG 12-Lead   Essential hypertension Blood pressure markedly elevated today, recommended she restart her Lasix 20 daily, valsartan 80 daily, Coreg 6.25 twice daily Recommend she monitor blood pressure at home on these medications If blood pressure runs low we could cut the Coreg or valsartan in half  Diastolic CHF Ejection fraction 55 to 60% Minimal leg edema on today's visit, Recommend oxygen, which she has Also restart her Lasix Does not need for verquvo at this time given ejection fraction equal to or greater than 50%  Smoker/COPD Recent hospitalization for COPD exacerbation, records discussed with her from the hospital  We have encouraged her to continue to work on weaning her cigarettes and smoking cessation.  She will continue to work on this and does not want any assistance with chantix.   She reports that she quit 2 months ago but struggling  Hyperlipidemia Continue Crestor Will treat aggressively, goal LDL less than 70   Total encounter time more than 60 minutes  Greater than 50% was spent in counseling and coordination of care with the patient    Signed, Dossie Arbour, M.D., Ph.D. [facility] [place], [place] [phone]

## 2021-09-21 ENCOUNTER — Other Ambulatory Visit: Payer: Self-pay | Admitting: Family Medicine

## 2021-09-21 DIAGNOSIS — Z1231 Encounter for screening mammogram for malignant neoplasm of breast: Secondary | ICD-10-CM

## 2021-10-06 ENCOUNTER — Other Ambulatory Visit: Payer: Self-pay | Admitting: Family Medicine

## 2021-11-15 ENCOUNTER — Other Ambulatory Visit: Payer: Self-pay

## 2021-11-15 ENCOUNTER — Ambulatory Visit
Admission: RE | Admit: 2021-11-15 | Discharge: 2021-11-15 | Disposition: A | Discharge status: Home / Self Care | Payer: Medicare HMO | Source: Ambulatory Visit | Attending: Family Medicine | Admitting: Family Medicine

## 2021-11-15 DIAGNOSIS — Z1231 Encounter for screening mammogram for malignant neoplasm of breast: Secondary | ICD-10-CM | POA: Insufficient documentation

## 2021-12-15 ENCOUNTER — Other Ambulatory Visit: Payer: Self-pay | Admitting: *Deleted

## 2021-12-15 DIAGNOSIS — Z87891 Personal history of nicotine dependence: Secondary | ICD-10-CM

## 2022-01-23 ENCOUNTER — Ambulatory Visit
Admission: RE | Admit: 2022-01-23 | Discharge: 2022-01-23 | Disposition: A | Discharge status: Home / Self Care | Payer: Medicare HMO | Source: Ambulatory Visit | Attending: Acute Care | Admitting: Acute Care

## 2022-01-23 ENCOUNTER — Other Ambulatory Visit: Payer: Self-pay

## 2022-01-23 ENCOUNTER — Ambulatory Visit (INDEPENDENT_AMBULATORY_CARE_PROVIDER_SITE_OTHER): Payer: Medicare HMO | Admitting: Acute Care

## 2022-01-23 ENCOUNTER — Encounter: Payer: Self-pay | Admitting: Acute Care

## 2022-01-23 DIAGNOSIS — Z87891 Personal history of nicotine dependence: Secondary | ICD-10-CM

## 2022-01-23 NOTE — Patient Instructions (Signed)
Thank you for participating in the [facility]. °It was our pleasure to meet you today. °We will call you with the results of your scan within the next few days. °Your scan will be assigned a Lung RADS category score by the physicians reading the scans.  °This Lung RADS score determines follow up scanning.  °See below for description of categories, and follow up screening recommendations. °We will be in touch to schedule your follow up screening annually or based on recommendations of our providers. °We will fax a copy of your scan results to your Primary Care Physician, or the physician who referred you to the program, to ensure they have the results. °Please call the office if you have any questions or concerns regarding your scanning experience or results.  °Our office number is [phone]. °Please speak with Denise Phelps, RN. She is our Lung Cancer Screening RN. °If she is unavailable when you call, please have the office staff send her a message. She will return your call at her earliest convenience. °Remember, if your scan is normal, we will scan you annually as long as you continue to meet the criteria for the program. (Age 55-77, Current smoker or smoker who has quit within the last 15 years). °If you are a smoker, remember, quitting is the single most powerful action that you can take to decrease your risk of lung cancer and other pulmonary, breathing related problems. °We know quitting is hard, and we are here to help.  °Please let us know if there is anything we can do to help you meet your goal of quitting. °If you are a former smoker, congratulations. We are proud of you! Remain smoke free! °Remember you can refer friends or family members through the number above.  °We will screen them to make sure they meet criteria for the program. °Thank you for helping us take better care of you by participating in Lung Screening. ° °You can receive free nicotine replacement therapy  ( patches, gum or mints) by calling 1-800-QUIT NOW. Please call so we can get you on the path to becoming  a non-smoker. I know it is hard, but you can do this! ° °Lung RADS Categories: ° °Lung RADS 1: no nodules or definitely non-concerning nodules.  °Recommendation is for a repeat annual scan in 12 months. ° °Lung RADS 2:  nodules that are non-concerning in appearance and behavior with a very low likelihood of becoming an active cancer. °Recommendation is for a repeat annual scan in 12 months. ° °Lung RADS 3: nodules that are probably non-concerning , includes nodules with a low likelihood of becoming an active cancer.  Recommendation is for a 6-month repeat screening scan. Often noted after an upper respiratory illness. We will be in touch to make sure you have no questions, and to schedule your 6-month scan. ° °Lung RADS 4 A: nodules with concerning findings, recommendation is most often for a follow up scan in 3 months or additional testing based on our provider's assessment of the scan. We will be in touch to make sure you have no questions and to schedule the recommended 3 month follow up scan. ° °Lung RADS 4 B:  indicates findings that are concerning. We will be in touch with you to schedule additional diagnostic testing based on our provider's  assessment of the scan. ° °Hypnosis for smoking cessation  °[employer] °[phone] ° °Acupuncture for smoking cessation  °[facility] °[phone]  °

## 2022-01-23 NOTE — Progress Notes (Signed)
Virtual Visit via Telephone Note ? ?I connected with Krystal Lee on 01/23/22 at 11:30 AM EST by telephone and verified that I am speaking with the correct person using two identifiers. ? ?Location: ?Patient:  At home ?Provider:  [address] [place], [place], [address]  ?  ?I discussed the limitations, risks, security and privacy concerns of performing an evaluation and management service by telephone and the availability of in person appointments. I also discussed with the patient that there may be a patient responsible charge related to this service. The patient expressed understanding and agreed to proceed. ? ? ? ?Shared Decision Making Visit Lung Cancer Screening Program ?([phone]) ? ? ?Eligibility: ?Age 66 y.o. ?Pack Years Smoking History Calculation 50 pack year smoking history ?(# packs/per year x # years smoked) ?Recent History of coughing up blood  no ?Unexplained weight loss? no ?( >Than 15 pounds within the last 6 months ) ?Prior History Lung / other cancer no ?(Diagnosis within the last 5 years already requiring surveillance chest CT Scans). ?Smoking Status Former Smoker ?Former Smokers: Years since quit: < 1 year ? Quit Date: 07/10/2021 ? ?Visit Components: ?Discussion included one or more decision making aids. yes ?Discussion included risk/benefits of screening. yes ?Discussion included potential follow up diagnostic testing for abnormal scans. yes ?Discussion included meaning and risk of over diagnosis. yes ?Discussion included meaning and risk of False Positives. yes ?Discussion included meaning of total radiation exposure. yes ? ?Counseling Included: ?Importance of adherence to annual lung cancer LDCT screening. yes ?Impact of comorbidities on ability to participate in the program. yes ?Ability and willingness to under diagnostic treatment. yes ? ?Smoking Cessation Counseling: ?Current Smokers:  ?Discussed importance of smoking cessation. yes ?Information about tobacco cessation classes  and interventions provided to patient. yes ?Patient provided with "ticket" for LDCT Scan. yes ?Symptomatic Patient. no ? Counseling NA ?Diagnosis Code: Tobacco Use Z72.0 ?Asymptomatic Patient yes ? Counseling (Intermediate counseling: > three minutes counseling) L4650 ?Former Smokers:  ?Discussed the importance of maintaining cigarette abstinence. yes ?Diagnosis Code: Personal History of Nicotine Dependence. P54.656 ?Information about tobacco cessation classes and interventions provided to patient. Yes ?Patient provided with "ticket" for LDCT Scan. yes ?Written Order for Lung Cancer Screening with LDCT placed in Epic. Yes ?(CT Chest Lung Cancer Screening Low Dose W/O CM) CLE7517 ?Z12.2-Screening of respiratory organs ?Z87.891-Personal history of nicotine dependence ? ?I spent 25 minutes of face to face time/virtual visit time  with  Krystal Lee discussing the risks and benefits of lung cancer screening. We took the time to pause the power point at intervals to allow for questions to be asked and answered to ensure understanding. We discussed that she had taken the single most powerful action possible to decrease her risk of developing lung cancer when she quit smoking. I counseled her to remain smoke free, and to contact me if she ever had the desire to smoke again so that I can provide resources and tools to help support the effort to remain smoke free. We discussed the time and location of the scan, and that either  [name] RN, [name], RN or I  or I will call / send a letter with the results within  24-72 hours of receiving them. She has the office contact information in the event she needs to speak with me,  she verbalized understanding of all of the above and had no further questions upon leaving the office.  ? ? ? ?I explained to the patient that  there has been a high incidence of coronary artery disease noted on these exams. I explained that this is a non-gated exam therefore degree or severity  cannot be determined. This patient is on statin therapy. I have asked the patient to follow-up with their PCP regarding any incidental finding of coronary artery disease and management with diet or medication as they feel is clinically indicated. The patient verbalized understanding of the above and had no further questions. ?  ? ? ?[name], NP ?01/23/2022 ? ? ? ? ? ? ?

## 2022-01-25 ENCOUNTER — Telehealth: Payer: Self-pay | Admitting: Acute Care

## 2022-01-25 ENCOUNTER — Other Ambulatory Visit: Payer: Self-pay

## 2022-01-25 DIAGNOSIS — Z87891 Personal history of nicotine dependence: Secondary | ICD-10-CM

## 2022-01-25 NOTE — Telephone Encounter (Signed)
Left VM for patient to call re: LDCT results ?

## 2022-05-17 ENCOUNTER — Telehealth: Payer: Self-pay | Admitting: Cardiovascular Disease

## 2022-05-17 NOTE — Telephone Encounter (Signed)
Pt c/o Shortness Of Breath: STAT if SOB developed within the last 24 hours or pt is noticeably SOB on the phone  1. Are you currently SOB (can you hear that pt is SOB on the phone)? Yes  2. How long have you been experiencing SOB? Always have it, but started getting worse about 10 days ago  3. Are you SOB when sitting or when up moving around?  All the time  4.  Are you currently experiencing any other symptoms? Ankles and feet swollen really bad, they are purple. She wants to be seen- but want to see only Dr Mariah Milling- His first available is OctoberSd

## 2022-05-17 NOTE — Telephone Encounter (Signed)
Incoming triage call.  Patient sts that she that she is always sob. She has copd and is on 2 L of O2. Patient sts that she has been more sob and increased LE edema the last 10-14 days. She was seen by her pcp recently for copd exacerbation, and was given treatment corticosteroid with no improvement. Pt sts that an echo was ordered that showed fluid around her heart. Her pcp resumed lasix at 20 mg daily earlier this week and recommended that she f/u with Cardiology. Patient sts that she will only see Dr. Mariah Milling, offered her a sooner appt with another provided and she declined.  Pt reports improvement in her sob and swelling since resuming lasix. Recommended that she add leg elevation to help with her LE edema.  Patient was scheduled by scheduling for first available with Dr. Mariah Milling in October. Rescheduled the pt on July 18 @ 11am with Dr. Mariah Milling (used first available DOD slot). Adv the pt that that I will ask scheduling to place her on the wait list for a sooner appt. Adv the patient to contact the office in the interim if symptoms worsen. Pt agreeable with the plan and voiced appreciation.

## 2022-05-29 ENCOUNTER — Encounter: Payer: Self-pay | Admitting: *Deleted

## 2022-06-04 NOTE — Progress Notes (Unsigned)
Cardiology Office Note  Date:  06/05/2022   ID:  Krystal, Lee [birth year], MRN [id number]  PCP:  Armando Gang, FNP   Chief Complaint  Patient presents with   Shortness of Breath    Patient c/o chest heaviness, shortness of breath and LE edema. Medications reviewed by the patient verbally.     HPI:  [name] is a 66 year old woman with past medical history of Smoking COPD HTN bipolar Recent hospitalization for COPD exacerbation Who presents for f/u of her  hypertension, abnormal echocardiogram  LOV 8/22 In hospital 05/29/22 copd exacerbation In hospital 05/18/2021: copd exacerbation In hospital 1/23: copd exacerbation  In follow-up today reports that she is not on carvedilol, blood pressure was too low and she had symptoms.  Previously on 6.25 twice daily  She is not taking her verquvo, provided by primary care Reports having significant side effects: SOB worse, back pain, blurred vision, "black spots", drops blood perssure Has been off the medication since July 11, reports feeling better  Followed by PMD for COPD Has oxygen for sleep, 2.5 liters, has long cord  Has a neb machine, On inhaler trelegy chronic shortness of breath worse with exertion, portable oxygen has been ordered for the patient through primary care, needs to use a tank for several months before she gets a portable oxygen generator  Quit smoking >12 months ago  Echo 05/20/2021 Normal  EF  55 to 60%  EKG personally reviewed by myself on todays visit Sinus tachycardia rate 108 no ST or T wave changes   PMH:   has a past medical history of Anxiety, Arthritis, Bipolar 1 disorder (HCC), COPD (chronic obstructive pulmonary disease) (HCC), Depression, GERD (gastroesophageal reflux disease), Headache, Hepatitis C virus, Hypertension, Post-menopausal, and Schizoaffective disorder (HCC).  PSH:    Past Surgical History:  Procedure Laterality Date   CATARACT EXTRACTION  2010   right     CATARACT EXTRACTION W/PHACO Left 06/09/2019   Procedure: CATARACT EXTRACTION PHACO AND INTRAOCULAR LENS PLACEMENT (IOC) COMPLICATED  LEFT;  Surgeon: [name], MD;  Location: [facility];  Service: Ophthalmology;  Laterality: Left;  VISION BLUE  [place]   LOWER EXTREMITY ANGIOGRAPHY Bilateral 08/31/2013   [facility], Dr. [name]    Current Outpatient Medications  Medication Sig Dispense Refill   albuterol (PROVENTIL) (2.5 MG/3ML) 0.083% nebulizer solution Take 2.5 mg by nebulization every 6 (six) hours as needed for wheezing or shortness of breath.     ALPRAZolam (XANAX) 0.5 MG tablet Take 0.5 mg by mouth 3 (three) times daily as needed for anxiety.     furosemide (LASIX) 20 MG tablet Take 1 tablet (20 mg total) by mouth daily. 30 tablet    ibuprofen (ADVIL,MOTRIN) 800 MG tablet Take 800 mg by mouth every 8 (eight) hours as needed for mild pain or moderate pain.     montelukast (SINGULAIR) 10 MG tablet Take 10 mg by mouth daily.     pantoprazole (PROTONIX) 40 MG tablet Take 40 mg by mouth 2 (two) times daily.     QUEtiapine (SEROQUEL) 300 MG tablet Take 300 mg by mouth at bedtime.     roflumilast (DALIRESP) 500 MCG TABS tablet Take 500 mcg by mouth daily.     rosuvastatin (CRESTOR) 40 MG tablet Take 40 mg by mouth at bedtime.     TRELEGY ELLIPTA 100-62.5-25 MCG/INH AEPB Inhale 1 puff into the lungs daily.     valsartan (DIOVAN) 80 MG tablet Take 1 tablet (80 mg total) by mouth  daily. 30 tablet 0   estrogens, conjugated, (PREMARIN) 0.3 MG tablet Take 0.3 mg by mouth See admin instructions. Take daily for 25 days then do not take for 5 days. (Patient not taking: Reported on 06/05/2022)     progesterone (PROMETRIUM) 100 MG capsule Take 100 mg by mouth at bedtime. (Patient not taking: Reported on 06/05/2022)     VERQUVO 10 MG TABS Take 1 tablet by mouth daily. (Patient not taking: Reported on 06/05/2022)     No current facility-administered medications for this visit.     Allergies:    Patient has no known allergies.   Social History:  The patient  reports that she quit smoking about 13 months ago. Her smoking use included cigarettes. She has a 45.00 pack-year smoking history. She has never used smokeless tobacco. She reports that she does not currently use alcohol after a past usage of about 1.0 standard drink of alcohol per week. She reports that she does not use drugs.   Family History:   family history includes Bone cancer in her paternal grandmother; Breast cancer in her paternal grandmother; Heart disease in her father and paternal uncle; Pancreatic cancer in her mother; Stroke in her paternal grandfather.    Review of Systems: Review of Systems  Constitutional: Negative.   HENT: Negative.    Respiratory: Negative.    Cardiovascular: Negative.   Gastrointestinal: Negative.   Musculoskeletal: Negative.   Neurological: Negative.   Psychiatric/Behavioral: Negative.    All other systems reviewed and are negative.   PHYSICAL EXAM: VS:  BP 90/60 (BP Location: Left Arm, Patient Position: Sitting, Cuff Size: Normal)   Pulse (!) 108   Ht 4\' 11"  (1.499 m)   Wt 183 lb 2 oz (83.1 kg)   SpO2 98%   BMI 36.99 kg/m  , BMI Body mass index is 36.99 kg/m. Constitutional:  oriented to person, place, and time. No distress.  Obese HENT:  Head: Grossly normal Eyes:  no discharge. No scleral icterus.  Neck: No JVD, no carotid bruits  Cardiovascular: Regular rate and rhythm, no murmurs appreciated Pulmonary/Chest: Mildly decreased breath sounds throughout otherwise clear Abdominal: Soft.  no distension.  no tenderness.  Musculoskeletal: Normal range of motion Neurological:  normal muscle tone. Coordination normal. No atrophy Skin: Skin warm and dry Psychiatric: normal affect, pleasant   Recent Labs: No results found for requested labs within last 365 days.    Lipid Panel No results found for: "CHOL", "HDL", "LDLCALC", "TRIG"    Wt Readings from Last 3 Encounters:   06/05/22 183 lb 2 oz (83.1 kg)  01/23/22 162 lb (73.5 kg)  07/10/21 170 lb (77.1 kg)     ASSESSMENT AND PLAN:  Problem List Items Addressed This Visit       Cardiology Problems   Hypertension   Relevant Medications   VERQUVO 10 MG TABS   HLD (hyperlipidemia)   Relevant Medications   VERQUVO 10 MG TABS   Other Visit Diagnoses     Centrilobular emphysema (HCC)    -  Primary     Essential hypertension Blood pressure running low, she took herself off carvedilol We will continue valsartan for now but could cut the dose in half if blood pressure remains low  Diastolic CHF Not a major issue, likely exacerbated by underlying lung disease from COPD Medication options limited in the setting of hypotension Ejection fraction 55 to 60% Minimal leg edema on today's visit, On Lasix daily, stable renal function Off verquvo at this time  (  started by primary care), Stopped this on her own July 11 Side effects as detailed above.  Agree with holding  Smoker/COPD Frequent hospitalizations for COPD exacerbation Portable oxygen has been ordered for the patient  Hyperlipidemia Continue Crestor Will treat aggressively, goal LDL less than 70   Total encounter time more than 30 minutes  Greater than 50% was spent in counseling and coordination of care with the patient    Signed, Dossie Arbour, M.D., Ph.D. [facility] [place], [place] [phone]

## 2022-06-05 ENCOUNTER — Ambulatory Visit (INDEPENDENT_AMBULATORY_CARE_PROVIDER_SITE_OTHER): Payer: Medicare HMO | Admitting: Cardiovascular Disease

## 2022-06-05 ENCOUNTER — Encounter: Payer: Self-pay | Admitting: Cardiovascular Disease

## 2022-06-05 VITALS — BP 90/60 | HR 108 | Ht 59.0 in | Wt 183.1 lb

## 2022-06-05 DIAGNOSIS — I10 Essential (primary) hypertension: Secondary | ICD-10-CM

## 2022-06-05 DIAGNOSIS — J432 Centrilobular emphysema: Secondary | ICD-10-CM | POA: Diagnosis not present

## 2022-06-05 DIAGNOSIS — E782 Mixed hyperlipidemia: Secondary | ICD-10-CM

## 2022-06-05 NOTE — Patient Instructions (Addendum)
Medication Instructions:  ?No changes ? ?If you need a refill on your cardiac medications before your next appointment, please call your pharmacy.  ? ?Lab work: ?No new labs needed ? ?Testing/Procedures: ?No new testing needed ? ?Follow-Up: ?At [facility], you and your health needs are our priority.  As part of our continuing mission to provide you with exceptional heart care, we have created designated Provider Care Teams.  These Care Teams include your primary Cardiologist (physician) and Advanced Practice Providers (APPs -  Physician Assistants and Nurse Practitioners) who all work together to provide you with the care you need, when you need it. ? ?You will need a follow up appointment in 6 months, APP ok ? ?Providers on your designated Care Team:   ?Christopher Berge, NP ?Ryan Dunn, PA-C ?Cadence Furth, PA-C ? ?COVID-19 Vaccine Information can be found at: [email or web address] For questions related to vaccine distribution or appointments, please email [email or web address] or call [phone].  ? ?

## 2022-07-25 LAB — COLOGUARD: COLOGUARD: POSITIVE — AB

## 2022-08-21 ENCOUNTER — Ambulatory Visit: Payer: Medicare HMO | Admitting: Cardiovascular Disease

## 2022-09-13 ENCOUNTER — Other Ambulatory Visit: Payer: Self-pay | Admitting: Family Medicine

## 2022-09-13 DIAGNOSIS — Z1231 Encounter for screening mammogram for malignant neoplasm of breast: Secondary | ICD-10-CM

## 2022-09-15 ENCOUNTER — Emergency Department
Admission: EM | Admit: 2022-09-15 | Discharge: 2022-09-16 | Disposition: A | Discharge status: Home / Self Care | Payer: Medicare HMO | Attending: Emergency Medicine | Admitting: Emergency Medicine

## 2022-09-15 ENCOUNTER — Encounter: Payer: Self-pay | Admitting: Emergency Medicine

## 2022-09-15 ENCOUNTER — Other Ambulatory Visit: Payer: Self-pay

## 2022-09-15 ENCOUNTER — Emergency Department: Payer: Medicare HMO

## 2022-09-15 DIAGNOSIS — I10 Essential (primary) hypertension: Secondary | ICD-10-CM | POA: Diagnosis not present

## 2022-09-15 DIAGNOSIS — R0602 Shortness of breath: Secondary | ICD-10-CM | POA: Diagnosis not present

## 2022-09-15 DIAGNOSIS — F200 Paranoid schizophrenia: Secondary | ICD-10-CM | POA: Diagnosis not present

## 2022-09-15 DIAGNOSIS — R0600 Dyspnea, unspecified: Secondary | ICD-10-CM | POA: Diagnosis not present

## 2022-09-15 DIAGNOSIS — Z20822 Contact with and (suspected) exposure to covid-19: Secondary | ICD-10-CM | POA: Insufficient documentation

## 2022-09-15 DIAGNOSIS — F419 Anxiety disorder, unspecified: Secondary | ICD-10-CM | POA: Insufficient documentation

## 2022-09-15 DIAGNOSIS — J449 Chronic obstructive pulmonary disease, unspecified: Secondary | ICD-10-CM | POA: Diagnosis not present

## 2022-09-15 DIAGNOSIS — R0789 Other chest pain: Secondary | ICD-10-CM | POA: Diagnosis not present

## 2022-09-15 DIAGNOSIS — R519 Headache, unspecified: Secondary | ICD-10-CM | POA: Insufficient documentation

## 2022-09-15 DIAGNOSIS — Z046 Encounter for general psychiatric examination, requested by authority: Secondary | ICD-10-CM | POA: Diagnosis present

## 2022-09-15 DIAGNOSIS — R443 Hallucinations, unspecified: Secondary | ICD-10-CM

## 2022-09-15 LAB — URINALYSIS, ROUTINE W REFLEX MICROSCOPIC
Bilirubin Urine: NEGATIVE
Glucose, UA: NEGATIVE mg/dL
Hgb urine dipstick: NEGATIVE
Ketones, ur: 5 mg/dL — AB
Leukocytes,Ua: NEGATIVE
Nitrite: NEGATIVE
Protein, ur: NEGATIVE mg/dL
Specific Gravity, Urine: 1.023 (ref 1.005–1.030)
pH: 5 (ref 5.0–8.0)

## 2022-09-15 LAB — COMPREHENSIVE METABOLIC PANEL
ALT: 19 U/L (ref 0–44)
AST: 21 U/L (ref 15–41)
Albumin: 4 g/dL (ref 3.5–5.0)
Alkaline Phosphatase: 63 U/L (ref 38–126)
Anion gap: 9 (ref 5–15)
BUN: 13 mg/dL (ref 8–23)
CO2: 19 mmol/L — ABNORMAL LOW (ref 22–32)
Calcium: 9 mg/dL (ref 8.9–10.3)
Chloride: 114 mmol/L — ABNORMAL HIGH (ref 98–111)
Creatinine, Ser: 0.64 mg/dL (ref 0.44–1.00)
GFR, Estimated: >60 mL/min (ref >60)
Glucose, Bld: 158 mg/dL — ABNORMAL HIGH (ref 70–99)
Potassium: 3.6 mmol/L (ref 3.5–5.1)
Sodium: 142 mmol/L (ref 135–145)
Total Bilirubin: 0.5 mg/dL (ref 0.3–1.2)
Total Protein: 6.8 g/dL (ref 6.5–8.1)

## 2022-09-15 LAB — CBC WITH DIFFERENTIAL/PLATELET
Abs Immature Granulocytes: 0.03 10*3/uL (ref 0.00–0.07)
Basophils Absolute: 0.1 10*3/uL (ref 0.0–0.1)
Basophils Relative: 1 %
Eosinophils Absolute: 0.2 10*3/uL (ref 0.0–0.5)
Eosinophils Relative: 2 %
HCT: 38 % (ref 36.0–46.0)
Hemoglobin: 12.6 g/dL (ref 12.0–15.0)
Immature Granulocytes: 0 %
Lymphocytes Relative: 24 %
Lymphs Abs: 2.4 10*3/uL (ref 0.7–4.0)
MCH: 30.4 pg (ref 26.0–34.0)
MCHC: 33.2 g/dL (ref 30.0–36.0)
MCV: 91.8 fL (ref 80.0–100.0)
Monocytes Absolute: 0.6 10*3/uL (ref 0.1–1.0)
Monocytes Relative: 6 %
Neutro Abs: 6.9 10*3/uL (ref 1.7–7.7)
Neutrophils Relative %: 67 %
Platelets: 262 10*3/uL (ref 150–400)
RBC: 4.14 MIL/uL (ref 3.87–5.11)
RDW: 13.3 % (ref 11.5–15.5)
WBC: 10.2 10*3/uL (ref 4.0–10.5)
nRBC: 0 % (ref 0.0–0.2)

## 2022-09-15 LAB — URINE DRUG SCREEN, QUALITATIVE (ARMC ONLY)
Amphetamines, Ur Screen: NOT DETECTED
Barbiturates, Ur Screen: NOT DETECTED
Benzodiazepine, Ur Scrn: POSITIVE — AB
Cannabinoid 50 Ng, Ur ~~LOC~~: NOT DETECTED
Cocaine Metabolite,Ur ~~LOC~~: NOT DETECTED
MDMA (Ecstasy)Ur Screen: NOT DETECTED
Methadone Scn, Ur: NOT DETECTED
Opiate, Ur Screen: NOT DETECTED
Phencyclidine (PCP) Ur S: NOT DETECTED
Tricyclic, Ur Screen: POSITIVE — AB

## 2022-09-15 LAB — D-DIMER, QUANTITATIVE: D-Dimer, Quant: 0.58 ug/mL-FEU — ABNORMAL HIGH (ref 0.00–0.50)

## 2022-09-15 LAB — BRAIN NATRIURETIC PEPTIDE: B Natriuretic Peptide: 23.4 pg/mL (ref 0.0–100.0)

## 2022-09-15 LAB — TROPONIN I (HIGH SENSITIVITY): Troponin I (High Sensitivity): 5 ng/L (ref <18)

## 2022-09-15 LAB — ACETAMINOPHEN LEVEL: Acetaminophen (Tylenol), Serum: <10 ug/mL — ABNORMAL LOW (ref 10–30)

## 2022-09-15 LAB — SALICYLATE LEVEL: Salicylate Lvl: <7 mg/dL — ABNORMAL LOW (ref 7.0–30.0)

## 2022-09-15 LAB — ETHANOL: Alcohol, Ethyl (B): <10 mg/dL (ref <10)

## 2022-09-15 MED ORDER — IPRATROPIUM-ALBUTEROL 0.5-2.5 (3) MG/3ML IN SOLN
3.0000 mL | Freq: Once | RESPIRATORY_TRACT | Status: AC
  Administered 2022-09-15: 3 mL via RESPIRATORY_TRACT
  Filled 2022-09-15: qty 3

## 2022-09-15 MED ORDER — SODIUM CHLORIDE 0.9 % IV BOLUS
1000.0000 mL | Freq: Once | INTRAVENOUS | Status: AC
  Administered 2022-09-15: 1000 mL via INTRAVENOUS

## 2022-09-15 MED ORDER — ONDANSETRON HCL 4 MG/2ML IJ SOLN
4.0000 mg | Freq: Once | INTRAMUSCULAR | Status: AC
  Administered 2022-09-15: 4 mg via INTRAVENOUS
  Filled 2022-09-15: qty 2

## 2022-09-15 MED ORDER — METHYLPREDNISOLONE SODIUM SUCC 125 MG IJ SOLR
125.0000 mg | Freq: Once | INTRAMUSCULAR | Status: AC
  Administered 2022-09-15: 125 mg via INTRAVENOUS
  Filled 2022-09-15: qty 2

## 2022-09-15 MED ORDER — MORPHINE SULFATE (PF) 2 MG/ML IV SOLN
2.0000 mg | Freq: Once | INTRAVENOUS | Status: AC
  Administered 2022-09-15: 2 mg via INTRAVENOUS
  Filled 2022-09-15: qty 1

## 2022-09-15 NOTE — ED Triage Notes (Signed)
Pt to ED via POV. Pt states CP to L breast. Pt states normally takes BP medications, however has not been taking them x 2 days. Pt states has been having hallucinations x 2 months, however attributes hallucinations to her BP meds so she stopped taking them. Pt states she feels like she is a danger to herself in that she worries in an attempt to get away from them she will hurt herself. Pt acknowledges that she is hallucinating, states she is trying to get to the behavioral ward, also states  that her hallucinations are "horrific, they aren't angels and butterflies".   Pt A&O x4, calm and cooperative in triage.

## 2022-09-15 NOTE — ED Notes (Signed)
Attempted iv insertion without success. MD to attempt ultrasound guided iv access for cta chest.

## 2022-09-15 NOTE — ED Provider Notes (Signed)
[facility] Provider Note    Event Date/Time   First MD Initiated Contact with Patient 09/15/22 2047     (approximate)   History   Chest Pain and Hallucinations   HPI  Krystal Lee is a 66 y.o. female complaining of pleuritic chest pain in the left anterior chest along with increasing shortness of breath.  She does not have a productive cough she does not have a fever.  She is tachycardic with a low blood pressure 101/76.  O2 sats are good on oxygen.  She reports increasing oxygen requirement.  She does take Lasix.  Is possible that this could be CHF.  I will check a BNP.      Physical Exam   Triage Vital Signs: ED Triage Vitals  Enc Vitals Group     BP 09/15/22 2025 101/76     Pulse Rate 09/15/22 2025 (!) 125     Resp 09/15/22 2025 17     Temp 09/15/22 2025 (!) 97.5 F (36.4 C)     Temp Source 09/15/22 2025 Oral     SpO2 09/15/22 2025 97 %     Weight 09/15/22 2030 183 lb 3.2 oz (83.1 kg)     Height 09/15/22 2030 4\' 11"  (1.499 m)     Head Circumference --      Peak Flow --      Pain Score 09/15/22 2030 10     Pain Loc --      Pain Edu? --      Excl. in [place]? --     Most recent vital signs: Vitals:   09/15/22 2200 09/15/22 2245  BP: 132/75 (!) 158/93  Pulse: 98 (!) 103  Resp: 15 19  Temp:    SpO2: 96% 96%     General: Awake, breathing hard but otherwise in no distress Head normocephalic atraumatic CV:  Good peripheral perfusion.  Heart regular rate and rhythm no audible murmurs but tacky Resp:  Some increased effort.  I do not hear any wheezing I do hear fair air movement. Abd:  No distention.  No tenderness Extremities: Slight trace edema bilaterally   ED Results / Procedures / Treatments   Labs (all labs ordered are listed, but only abnormal results are displayed) Labs Reviewed  COMPREHENSIVE METABOLIC PANEL - Abnormal; Notable for the following components:      Result Value   Chloride 114 (*)    CO2 19 (*)     Glucose, Bld 158 (*)    All other components within normal limits  SALICYLATE LEVEL - Abnormal; Notable for the following components:   Salicylate Lvl <7.6 (*)    All other components within normal limits  ACETAMINOPHEN LEVEL - Abnormal; Notable for the following components:   Acetaminophen (Tylenol), Serum <10 (*)    All other components within normal limits  URINE DRUG SCREEN, QUALITATIVE ([facility] ONLY) - Abnormal; Notable for the following components:   Tricyclic, Ur Screen POSITIVE (*)    Benzodiazepine, Ur Scrn POSITIVE (*)    All other components within normal limits  D-DIMER, QUANTITATIVE - Abnormal; Notable for the following components:   D-Dimer, Quant 0.58 (*)    All other components within normal limits  URINALYSIS, ROUTINE W REFLEX MICROSCOPIC - Abnormal; Notable for the following components:   Color, Urine YELLOW (*)    APPearance HAZY (*)    Ketones, ur 5 (*)    All other components within normal limits  ETHANOL  CBC WITH DIFFERENTIAL/PLATELET  BRAIN  NATRIURETIC PEPTIDE  BLOOD GAS, VENOUS  TROPONIN I (HIGH SENSITIVITY)  TROPONIN I (HIGH SENSITIVITY)     EKG  EKG read interpreted by me shows sinus tachycardia rate of 120 normal axis no acute ST-T wave changes   RADIOLOGY Chest x-ray read by radiology reviewed and interpreted by me shows no acute disease   PROCEDURES:  Critical Care performed:   Procedures   MEDICATIONS ORDERED IN ED: Medications  ipratropium-albuterol (DUONEB) 0.5-2.5 (3) MG/3ML nebulizer solution 3 mL (3 mLs Nebulization Given 09/15/22 2205)  sodium chloride 0.9 % bolus 1,000 mL (1,000 mLs Intravenous New Bag/Given 09/15/22 2204)  ipratropium-albuterol (DUONEB) 0.5-2.5 (3) MG/3ML nebulizer solution 3 mL (3 mLs Nebulization Given 09/15/22 2217)     IMPRESSION / MDM / ASSESSMENT AND PLAN / ED COURSE  I reviewed the triage vital signs and the nursing notes. Patient with shortness of breath pleuritic chest pain clear chest x-ray and  elevated D-dimer is awaiting CT scan to rule out PE.  I am giving her some DuoNebs since she does have a history of COPD.  Initial troponin and BNP are negative.  Differential diagnosis includes, but is not limited to, COPD exacerbation, bronchitis, PE, anxiety  Patient's presentation is most consistent with acute presentation with potential threat to life or bodily function.  he patient is on the cardiac monitor to evaluate for evidence of arrhythmia and/or significant heart rate changes.  Nothing but sinus tachycardia has been seen I am going to sign this patient out to oncoming provider.  FINAL CLINICAL IMPRESSION(S) / ED DIAGNOSES   Final diagnoses:  Dyspnea, unspecified type  Atypical chest pain     Rx / DC Orders   ED Discharge Orders     None        Note:  This document was prepared using Dragon voice recognition software and may include unintentional dictation errors.   Nena Polio, MD 09/15/22 [id number]

## 2022-09-16 ENCOUNTER — Emergency Department: Payer: Medicare HMO

## 2022-09-16 ENCOUNTER — Inpatient Hospital Stay
Admission: AD | Admit: 2022-09-16 | Discharge: 2022-09-24 | DRG: 885 | Disposition: A | Discharge status: Home / Self Care | Payer: Medicare HMO | Source: Intra-hospital | Attending: Psychiatry | Admitting: Psychiatry

## 2022-09-16 DIAGNOSIS — I10 Essential (primary) hypertension: Secondary | ICD-10-CM | POA: Diagnosis present

## 2022-09-16 DIAGNOSIS — R443 Hallucinations, unspecified: Secondary | ICD-10-CM | POA: Diagnosis not present

## 2022-09-16 DIAGNOSIS — G4700 Insomnia, unspecified: Secondary | ICD-10-CM | POA: Diagnosis present

## 2022-09-16 DIAGNOSIS — Z803 Family history of malignant neoplasm of breast: Secondary | ICD-10-CM | POA: Diagnosis not present

## 2022-09-16 DIAGNOSIS — J449 Chronic obstructive pulmonary disease, unspecified: Secondary | ICD-10-CM | POA: Diagnosis present

## 2022-09-16 DIAGNOSIS — Z800 Family history of malignant neoplasm of digestive organs: Secondary | ICD-10-CM | POA: Diagnosis not present

## 2022-09-16 DIAGNOSIS — Z8249 Family history of ischemic heart disease and other diseases of the circulatory system: Secondary | ICD-10-CM

## 2022-09-16 DIAGNOSIS — Z87891 Personal history of nicotine dependence: Secondary | ICD-10-CM | POA: Diagnosis not present

## 2022-09-16 DIAGNOSIS — R0600 Dyspnea, unspecified: Secondary | ICD-10-CM | POA: Diagnosis not present

## 2022-09-16 DIAGNOSIS — Z823 Family history of stroke: Secondary | ICD-10-CM

## 2022-09-16 DIAGNOSIS — I350 Nonrheumatic aortic (valve) stenosis: Secondary | ICD-10-CM | POA: Diagnosis present

## 2022-09-16 DIAGNOSIS — F259 Schizoaffective disorder, unspecified: Principal | ICD-10-CM | POA: Diagnosis present

## 2022-09-16 DIAGNOSIS — F200 Paranoid schizophrenia: Secondary | ICD-10-CM | POA: Diagnosis not present

## 2022-09-16 DIAGNOSIS — F209 Schizophrenia, unspecified: Principal | ICD-10-CM | POA: Diagnosis present

## 2022-09-16 DIAGNOSIS — G8929 Other chronic pain: Secondary | ICD-10-CM | POA: Diagnosis present

## 2022-09-16 DIAGNOSIS — F419 Anxiety disorder, unspecified: Secondary | ICD-10-CM | POA: Diagnosis present

## 2022-09-16 DIAGNOSIS — R0789 Other chest pain: Secondary | ICD-10-CM

## 2022-09-16 DIAGNOSIS — Z79899 Other long term (current) drug therapy: Secondary | ICD-10-CM | POA: Diagnosis not present

## 2022-09-16 DIAGNOSIS — M549 Dorsalgia, unspecified: Secondary | ICD-10-CM | POA: Diagnosis present

## 2022-09-16 DIAGNOSIS — F203 Undifferentiated schizophrenia: Secondary | ICD-10-CM | POA: Diagnosis not present

## 2022-09-16 DIAGNOSIS — R441 Visual hallucinations: Secondary | ICD-10-CM | POA: Diagnosis present

## 2022-09-16 DIAGNOSIS — R000 Tachycardia, unspecified: Secondary | ICD-10-CM | POA: Diagnosis present

## 2022-09-16 DIAGNOSIS — K219 Gastro-esophageal reflux disease without esophagitis: Secondary | ICD-10-CM | POA: Diagnosis present

## 2022-09-16 HISTORY — DX: Schizophrenia, unspecified: F20.9

## 2022-09-16 LAB — BLOOD GAS, VENOUS
Acid-base deficit: 3.1 mmol/L — ABNORMAL HIGH (ref 0.0–2.0)
Bicarbonate: 22 mmol/L (ref 20.0–28.0)
O2 Saturation: 60.8 %
Patient temperature: 37
pCO2, Ven: 39 mmHg — ABNORMAL LOW (ref 44–60)
pH, Ven: 7.36 (ref 7.25–7.43)
pO2, Ven: 40 mmHg (ref 32–45)

## 2022-09-16 LAB — RESP PANEL BY RT-PCR (FLU A&B, COVID) ARPGX2
Influenza A by PCR: NEGATIVE
Influenza B by PCR: NEGATIVE
SARS Coronavirus 2 by RT PCR: NEGATIVE

## 2022-09-16 LAB — TROPONIN I (HIGH SENSITIVITY): Troponin I (High Sensitivity): 6 ng/L (ref <18)

## 2022-09-16 MED ORDER — TRAZODONE HCL 50 MG PO TABS: 50.0000 mg | ORAL_TABLET | Freq: Every evening | ORAL | Status: DC | PRN

## 2022-09-16 MED ORDER — IOHEXOL 350 MG/ML SOLN
75.0000 mL | Freq: Once | INTRAVENOUS | Status: AC | PRN
  Administered 2022-09-16: 75 mL via INTRAVENOUS

## 2022-09-16 MED ORDER — FLUTICASONE FUROATE-VILANTEROL 100-25 MCG/ACT IN AEPB
1.0000 | INHALATION_SPRAY | Freq: Every day | RESPIRATORY_TRACT | Status: DC
  Filled 2022-09-16: qty 28

## 2022-09-16 MED ORDER — MONTELUKAST SODIUM 10 MG PO TABS
10.0000 mg | ORAL_TABLET | Freq: Every day | ORAL | Status: DC
  Administered 2022-09-16: 10 mg via ORAL
  Filled 2022-09-16: qty 1

## 2022-09-16 MED ORDER — ALBUTEROL SULFATE HFA 108 (90 BASE) MCG/ACT IN AERS
2.0000 | INHALATION_SPRAY | RESPIRATORY_TRACT | Status: DC
  Administered 2022-09-17 – 2022-09-20 (×11): 2 via RESPIRATORY_TRACT
  Filled 2022-09-16: qty 6.7

## 2022-09-16 MED ORDER — ALUM & MAG HYDROXIDE-SIMETH 200-200-20 MG/5ML PO SUSP: 30.0000 mL | ORAL | Status: DC | PRN

## 2022-09-16 MED ORDER — QUETIAPINE FUMARATE 25 MG PO TABS
100.0000 mg | ORAL_TABLET | Freq: Every day | ORAL | Status: DC
  Administered 2022-09-16: 100 mg via ORAL
  Filled 2022-09-16: qty 4

## 2022-09-16 MED ORDER — FLUTICASONE FUROATE-VILANTEROL 100-25 MCG/ACT IN AEPB
1.0000 | INHALATION_SPRAY | Freq: Every day | RESPIRATORY_TRACT | Status: DC
  Administered 2022-09-17 – 2022-09-24 (×8): 1 via RESPIRATORY_TRACT
  Filled 2022-09-16: qty 28

## 2022-09-16 MED ORDER — ALPRAZOLAM 0.5 MG PO TABS
0.5000 mg | ORAL_TABLET | Freq: Three times a day (TID) | ORAL | Status: DC | PRN
  Administered 2022-09-16: 0.5 mg via ORAL
  Filled 2022-09-16: qty 1

## 2022-09-16 MED ORDER — MAGNESIUM HYDROXIDE 400 MG/5ML PO SUSP
30.0000 mL | Freq: Every day | ORAL | Status: DC | PRN
  Administered 2022-09-21: 30 mL via ORAL
  Filled 2022-09-16: qty 30

## 2022-09-16 MED ORDER — ROFLUMILAST 500 MCG PO TABS
500.0000 ug | ORAL_TABLET | Freq: Every day | ORAL | Status: DC
  Administered 2022-09-16: 500 ug via ORAL
  Filled 2022-09-16: qty 1

## 2022-09-16 MED ORDER — UMECLIDINIUM BROMIDE 62.5 MCG/ACT IN AEPB
1.0000 | INHALATION_SPRAY | Freq: Every day | RESPIRATORY_TRACT | Status: DC
  Filled 2022-09-16: qty 7

## 2022-09-16 MED ORDER — ALBUTEROL SULFATE (2.5 MG/3ML) 0.083% IN NEBU: 2.5000 mL | INHALATION_SOLUTION | RESPIRATORY_TRACT | Status: DC | PRN

## 2022-09-16 MED ORDER — ALBUTEROL SULFATE (2.5 MG/3ML) 0.083% IN NEBU: 2.5000 mg | INHALATION_SOLUTION | Freq: Four times a day (QID) | RESPIRATORY_TRACT | Status: DC | PRN

## 2022-09-16 MED ORDER — FUROSEMIDE 40 MG PO TABS
20.0000 mg | ORAL_TABLET | Freq: Every day | ORAL | Status: DC
  Administered 2022-09-16: 20 mg via ORAL
  Filled 2022-09-16: qty 1

## 2022-09-16 MED ORDER — QUETIAPINE FUMARATE 100 MG PO TABS
100.0000 mg | ORAL_TABLET | Freq: Every day | ORAL | Status: DC
  Administered 2022-09-17: 100 mg via ORAL
  Filled 2022-09-16: qty 1

## 2022-09-16 MED ORDER — ACETAMINOPHEN 325 MG PO TABS
650.0000 mg | ORAL_TABLET | Freq: Four times a day (QID) | ORAL | Status: DC | PRN
  Administered 2022-09-18 – 2022-09-24 (×12): 650 mg via ORAL
  Filled 2022-09-16 (×12): qty 2

## 2022-09-16 MED ORDER — HYDROXYZINE HCL 25 MG PO TABS
25.0000 mg | ORAL_TABLET | Freq: Three times a day (TID) | ORAL | Status: DC | PRN
  Administered 2022-09-18: 25 mg via ORAL
  Filled 2022-09-16: qty 1

## 2022-09-16 MED ORDER — VITAMIN D 25 MCG (1000 UNIT) PO TABS
1000.0000 [IU] | ORAL_TABLET | Freq: Every day | ORAL | Status: DC
  Administered 2022-09-16: 1000 [IU] via ORAL
  Filled 2022-09-16: qty 1

## 2022-09-16 MED ORDER — ROSUVASTATIN CALCIUM 20 MG PO TABS
40.0000 mg | ORAL_TABLET | Freq: Every day | ORAL | Status: DC
  Administered 2022-09-16: 40 mg via ORAL
  Filled 2022-09-16: qty 2

## 2022-09-16 MED ORDER — IRBESARTAN 75 MG PO TABS
37.5000 mg | ORAL_TABLET | Freq: Every day | ORAL | Status: DC
  Administered 2022-09-16: 37.5 mg via ORAL
  Filled 2022-09-16: qty 0.5

## 2022-09-16 NOTE — Consult Note (Signed)
[facility] Face-to-Face Psychiatry Consult   Reason for Consult:  Psychiatric Evaluation Referring Physician:  Dr. Elesa Massed Patient Identification: Krystal Lee MRN:  [id number] Principal Diagnosis: Schizophrenia, paranoid (HCC) Diagnosis:  Principal Problem:   Schizophrenia, paranoid (HCC) Active Problems:   Anxiety   Total Time spent with patient: 45 minutes  Subjective:   "I can't stop seeing them"  HPI:  Psych Assessment  Krystal Lee, 66 y.o., female with PMH of COPD, HTN, depression, as well as schizophrenia.   patient seen by this provider; chart reviewed and consulted with Dr. Elesa Massed on 09/16/22.  Patient was originally seen by Dr. Darnelle Catalan for complaints of pleuritic chest pain in the left anterior chest along with increasing shortness of breath. Per Dr. Darnelle Catalan, she does not have a productive cough she does not have a fever.  She is tachycardic with a low blood pressure 101/76.  O2 sats are good on oxygen.  She reports increasing oxygen requirement.  She does take Lasix.     On evaluation Krystal Lee reports she has been seeing rats and cats, has been attempting to kill the rats by hitting them with rooms with a bat that she keeps next to her bed. She says she has had an episode like this 3 years ago and beating the bed with a bat used to work. She says its no longer effective and she keeps seeing, hearing and feeling them run across the room. She also reports hand coming out of the wall and says she knows that's not real but wishes the visions would stop.  She states she used to take Zyprexa which was effective but that she could not tolerate the side effect and was switched to Seroquel 100 mg tablet nightly.  Patient states that she really needs help.  She denies SI but says she is scared that she may run into the street to stop the "critters" from chasing her. She has had family members come to her house to check and is very disappointed that they never see what she sees. Patient  is been increasingly confused and paranoid. Her sister finally convinced her to come to the hospital.  She has had decreased sleep for the last few  nights.   Recommendations:  Inpatient Psychiatric hospitalization   Past Psychiatric History: Schizophrenia  Risk to Self:   Risk to Others:   Prior Inpatient Therapy:   Prior Outpatient Therapy:    Past Medical History:  Past Medical History:  Diagnosis Date   Anxiety    Arthritis    knees   Bipolar 1 disorder (HCC)    COPD (chronic obstructive pulmonary disease) (HCC)    Depression    GERD (gastroesophageal reflux disease)    Headache    during allergy season   Hepatitis C virus    Hypertension    Post-menopausal    Schizoaffective disorder (HCC)     Past Surgical History:  Procedure Laterality Date   CATARACT EXTRACTION  2010   right    CATARACT EXTRACTION W/PHACO Left 06/09/2019   Procedure: CATARACT EXTRACTION PHACO AND INTRAOCULAR LENS PLACEMENT (IOC) COMPLICATED  LEFT;  Surgeon: [name], MD;  Location: [facility];  Service: Ophthalmology;  Laterality: Left;  VISION BLUE  OMIDRIA   LOWER EXTREMITY ANGIOGRAPHY Bilateral 08/31/2013   [facility], Dr. [name]   Family History:  Family History  Problem Relation Age of Onset   Breast cancer Paternal Grandmother    Bone cancer Paternal Grandmother    Pancreatic cancer Mother  Heart disease Father    Heart disease Paternal Uncle    Stroke Paternal Grandfather    Family Psychiatric  History:  Social History:  Social History   Substance and Sexual Activity  Alcohol Use Not Currently   Alcohol/week: 1.0 standard drink of alcohol   Types: 1 Cans of beer per week     Social History   Substance and Sexual Activity  Drug Use No    Social History   Socioeconomic History   Marital status: Widowed    Spouse name: Not on file   Number of children: Not on file   Years of education: Not on file   Highest education level: Not on file  Occupational History    Not on file  Tobacco Use   Smoking status: Former    Packs/day: 1.00    Years: 45.00    Total pack years: 45.00    Types: Cigarettes    Quit date: 05/01/2021    Years since quitting: 1.3   Smokeless tobacco: Never   Tobacco comments:    since age 66  Vaping Use   Vaping Use: Never used  Substance and Sexual Activity   Alcohol use: Not Currently    Alcohol/week: 1.0 standard drink of alcohol    Types: 1 Cans of beer per week   Drug use: No   Sexual activity: Not on file  Other Topics Concern   Not on file  Social History Narrative   Not on file   Social Determinants of Health   Financial Resource Strain: Not on file  Food Insecurity: Not on file  Transportation Needs: Not on file  Physical Activity: Not on file  Stress: Not on file  Social Connections: Not on file   Additional Social History:    Allergies:  No Known Allergies  Labs:  Results for orders placed or performed during the hospital encounter of 09/15/22 (from the past 48 hour(s))  Comprehensive metabolic panel     Status: Abnormal   Collection Time: 09/15/22  8:33 PM  Result Value Ref Range   Sodium 142 135 - 145 mmol/L   Potassium 3.6 3.5 - 5.1 mmol/L   Chloride 114 (H) 98 - 111 mmol/L   CO2 19 (L) 22 - 32 mmol/L   Glucose, Bld 158 (H) 70 - 99 mg/dL    Comment: Glucose reference range applies only to samples taken after fasting for at least 8 hours.   BUN 13 8 - 23 mg/dL   Creatinine, Ser 4.090.64 0.44 - 1.00 mg/dL   Calcium 9.0 8.9 - 81.110.3 mg/dL   Total Protein 6.8 6.5 - 8.1 g/dL   Albumin 4.0 3.5 - 5.0 g/dL   AST 21 15 - 41 U/L   ALT 19 0 - 44 U/L   Alkaline Phosphatase 63 38 - 126 U/L   Total Bilirubin 0.5 0.3 - 1.2 mg/dL   GFR, Estimated >91>60 >47>60 mL/min    Comment: (NOTE) Calculated using the CKD-EPI Creatinine Equation (2021)    Anion gap 9 5 - 15    Comment: Performed at [facility], [address] [place], [place] [address]  Ethanol     Status: None   Collection Time: 09/15/22   8:33 PM  Result Value Ref Range   Alcohol, Ethyl (B) <10 <10 mg/dL    Comment: (NOTE) Lowest detectable limit for serum alcohol is 10 mg/dL.  For medical purposes only. Performed at [facility], [address] [place], [place] [address]  Salicylate level     Status: Abnormal   Collection Time: 09/15/22  8:33 PM  Result Value Ref Range   Salicylate Lvl <7.0 (L) 7.0 - 30.0 mg/dL    Comment: Performed at [facility], [address] [place], [place] [address]  Acetaminophen level     Status: Abnormal   Collection Time: 09/15/22  8:33 PM  Result Value Ref Range   Acetaminophen (Tylenol), Serum <10 (L) 10 - 30 ug/mL    Comment: (NOTE) Therapeutic concentrations vary significantly. A range of 10-30 ug/mL  may be an effective concentration for many patients. However, some  are best treated at concentrations outside of this range. Acetaminophen concentrations >150 ug/mL at 4 hours after ingestion  and >50 ug/mL at 12 hours after ingestion are often associated with  toxic reactions.  Performed at [facility], [address] [place], [place] [address]   Troponin I (High Sensitivity)     Status: None   Collection Time: 09/15/22  8:33 PM  Result Value Ref Range   Troponin I (High Sensitivity) 5 <18 ng/L    Comment: (NOTE) Elevated high sensitivity troponin I (hsTnI) values and significant  changes across serial measurements may suggest ACS but many other  chronic and acute conditions are known to elevate hsTnI results.  Refer to the "Links" section for chest pain algorithms and additional  guidance. Performed at [facility], [address] [place], [place] [address]   CBC with Differential     Status: None   Collection Time: 09/15/22  8:33 PM  Result Value Ref Range   WBC 10.2 4.0 - 10.5 K/uL   RBC 4.14 3.87 - 5.11 MIL/uL   Hemoglobin 12.6 12.0 - 15.0 g/dL   HCT 67.5 91.6 - 38.4 %   MCV 91.8 80.0 - 100.0 fL   MCH 30.4 26.0 - 34.0  pg   MCHC 33.2 30.0 - 36.0 g/dL   RDW 66.5 99.3 - 57.0 %   Platelets 262 150 - 400 K/uL   nRBC 0.0 0.0 - 0.2 %   Neutrophils Relative % 67 %   Neutro Abs 6.9 1.7 - 7.7 K/uL   Lymphocytes Relative 24 %   Lymphs Abs 2.4 0.7 - 4.0 K/uL   Monocytes Relative 6 %   Monocytes Absolute 0.6 0.1 - 1.0 K/uL   Eosinophils Relative 2 %   Eosinophils Absolute 0.2 0.0 - 0.5 K/uL   Basophils Relative 1 %   Basophils Absolute 0.1 0.0 - 0.1 K/uL   Immature Granulocytes 0 %   Abs Immature Granulocytes 0.03 0.00 - 0.07 K/uL    Comment: Performed at [facility], [address] [place], [place] [address]  D-dimer, quantitative     Status: Abnormal   Collection Time: 09/15/22  8:33 PM  Result Value Ref Range   D-Dimer, Quant 0.58 (H) 0.00 - 0.50 ug/mL-FEU    Comment: (NOTE) At the manufacturer cut-off value of 0.5 g/mL FEU, this assay has a negative predictive value of 95-100%.This assay is intended for use in conjunction with a clinical pretest probability (PTP) assessment model to exclude pulmonary embolism (PE) and deep venous thrombosis (DVT) in outpatients suspected of PE or DVT. Results should be correlated with clinical presentation. Performed at [facility], [address], [place], [place] [address]   Urine Drug Screen, Qualitative     Status: Abnormal   Collection Time: 09/15/22  9:24 PM  Result Value Ref Range   Tricyclic, Ur Screen POSITIVE (A)  NONE DETECTED   Amphetamines, Ur Screen NONE DETECTED NONE DETECTED   MDMA (Ecstasy)Ur Screen NONE DETECTED NONE DETECTED   Cocaine Metabolite,Ur [place] NONE DETECTED NONE DETECTED   Opiate, Ur Screen NONE DETECTED NONE DETECTED   Phencyclidine (PCP) Ur S NONE DETECTED NONE DETECTED   Cannabinoid 50 Ng, Ur [place] NONE DETECTED NONE DETECTED   Barbiturates, Ur Screen NONE DETECTED NONE DETECTED   Benzodiazepine, Ur Scrn POSITIVE (A) NONE DETECTED   Methadone Scn, Ur NONE DETECTED NONE DETECTED    Comment: (NOTE) Tricyclics +  metabolites, urine    Cutoff 1000 ng/mL Amphetamines + metabolites, urine  Cutoff 1000 ng/mL MDMA (Ecstasy), urine              Cutoff 500 ng/mL Cocaine Metabolite, urine          Cutoff 300 ng/mL Opiate + metabolites, urine        Cutoff 300 ng/mL Phencyclidine (PCP), urine         Cutoff 25 ng/mL Cannabinoid, urine                 Cutoff 50 ng/mL Barbiturates + metabolites, urine  Cutoff 200 ng/mL Benzodiazepine, urine              Cutoff 200 ng/mL Methadone, urine                   Cutoff 300 ng/mL  The urine drug screen provides only a preliminary, unconfirmed analytical test result and should not be used for non-medical purposes. Clinical consideration and professional judgment should be applied to any positive drug screen result due to possible interfering substances. A more specific alternate chemical method must be used in order to obtain a confirmed analytical result. Gas chromatography / mass spectrometry (GC/MS) is the preferred confirm atory method. Performed at [facility], [address] [place], [place] [address]   Urinalysis, Routine w reflex microscopic     Status: Abnormal   Collection Time: 09/15/22  9:24 PM  Result Value Ref Range   Color, Urine YELLOW (A) YELLOW   APPearance HAZY (A) CLEAR   Specific Gravity, Urine 1.023 1.005 - 1.030   pH 5.0 5.0 - 8.0   Glucose, UA NEGATIVE NEGATIVE mg/dL   Hgb urine dipstick NEGATIVE NEGATIVE   Bilirubin Urine NEGATIVE NEGATIVE   Ketones, ur 5 (A) NEGATIVE mg/dL   Protein, ur NEGATIVE NEGATIVE mg/dL   Nitrite NEGATIVE NEGATIVE   Leukocytes,Ua NEGATIVE NEGATIVE    Comment: Performed at [facility], [address] [place], [place] [address]  Brain natriuretic peptide     Status: None   Collection Time: 09/15/22  9:24 PM  Result Value Ref Range   B Natriuretic Peptide 23.4 0.0 - 100.0 pg/mL    Comment: Performed at [facility], [address] [place], [place] [address]  Blood gas,  venous     Status: Abnormal   Collection Time: 09/16/22 12:05 AM  Result Value Ref Range   pH, Ven 7.36 7.25 - 7.43   pCO2, Ven 39 (L) 44 - 60 mmHg   pO2, Ven 40 32 - 45 mmHg   Bicarbonate 22.0 20.0 - 28.0 mmol/L   Acid-base deficit 3.1 (H) 0.0 - 2.0 mmol/L   O2 Saturation 60.8 %   Patient temperature 37.0    Collection site VEIN     Comment: Performed at [facility], [address] [place], [place] [address]  Troponin I (High Sensitivity)     Status:  None   Collection Time: 09/16/22 12:05 AM  Result Value Ref Range   Troponin I (High Sensitivity) 6 <18 ng/L    Comment: (NOTE) Elevated high sensitivity troponin I (hsTnI) values and significant  changes across serial measurements may suggest ACS but many other  chronic and acute conditions are known to elevate hsTnI results.  Refer to the "Links" section for chest pain algorithms and additional  guidance. Performed at [facility], [address] [place], [place] [address]   Resp Panel by RT-PCR (Flu A&B, Covid) Anterior Nasal Swab     Status: None   Collection Time: 09/16/22  1:00 AM   Specimen: Anterior Nasal Swab  Result Value Ref Range   SARS Coronavirus 2 by RT PCR NEGATIVE NEGATIVE    Comment: (NOTE) SARS-CoV-2 target nucleic acids are NOT DETECTED.  The SARS-CoV-2 RNA is generally detectable in upper respiratory specimens during the acute phase of infection. The lowest concentration of SARS-CoV-2 viral copies this assay can detect is 138 copies/mL. A negative result does not preclude SARS-Cov-2 infection and should not be used as the sole basis for treatment or other patient management decisions. A negative result may occur with  improper specimen collection/handling, submission of specimen other than nasopharyngeal swab, presence of viral mutation(s) within the areas targeted by this assay, and inadequate number of viral copies(<138 copies/mL). A negative result must be combined with clinical  observations, patient history, and epidemiological information. The expected result is Negative.  Fact Sheet for Patients:  EntrepreneurPulse.com.au  Fact Sheet for Healthcare Providers:  IncredibleEmployment.be  This test is no t yet approved or cleared by the [place] FDA and  has been authorized for detection and/or diagnosis of SARS-CoV-2 by FDA under an Emergency Use Authorization (EUA). This EUA will remain  in effect (meaning this test can be used) for the duration of the COVID-19 declaration under Section 564(b)(1) of the Act, 21 U.S.C.section 360bbb-3(b)(1), unless the authorization is terminated  or revoked sooner.       Influenza A by PCR NEGATIVE NEGATIVE   Influenza B by PCR NEGATIVE NEGATIVE    Comment: (NOTE) The Xpert Xpress SARS-CoV-2/FLU/RSV plus assay is intended as an aid in the diagnosis of influenza from Nasopharyngeal swab specimens and should not be used as a sole basis for treatment. Nasal washings and aspirates are unacceptable for Xpert Xpress SARS-CoV-2/FLU/RSV testing.  Fact Sheet for Patients: EntrepreneurPulse.com.au  Fact Sheet for Healthcare Providers: IncredibleEmployment.be  This test is not yet approved or cleared by the [place] FDA and has been authorized for detection and/or diagnosis of SARS-CoV-2 by FDA under an Emergency Use Authorization (EUA). This EUA will remain in effect (meaning this test can be used) for the duration of the COVID-19 declaration under Section 564(b)(1) of the Act, 21 U.S.C. section 360bbb-3(b)(1), unless the authorization is terminated or revoked.  Performed at [facility], [address] [place], [place] [address]     No current facility-administered medications for this encounter.   Current Outpatient Medications  Medication Sig Dispense Refill   albuterol (PROVENTIL) (2.5 MG/3ML) 0.083% nebulizer solution  Take 2.5 mg by nebulization every 6 (six) hours as needed for wheezing or shortness of breath.     ALPRAZolam (XANAX) 0.5 MG tablet Take 0.5 mg by mouth 3 (three) times daily as needed for anxiety.     estrogens, conjugated, (PREMARIN) 0.3 MG tablet Take 0.3 mg by mouth See admin instructions. Take daily for 25 days then do not take for 5  days. (Patient not taking: Reported on 06/05/2022)     furosemide (LASIX) 20 MG tablet Take 1 tablet (20 mg total) by mouth daily. 30 tablet    ibuprofen (ADVIL,MOTRIN) 800 MG tablet Take 800 mg by mouth every 8 (eight) hours as needed for mild pain or moderate pain.     montelukast (SINGULAIR) 10 MG tablet Take 10 mg by mouth daily.     pantoprazole (PROTONIX) 40 MG tablet Take 40 mg by mouth 2 (two) times daily.     progesterone (PROMETRIUM) 100 MG capsule Take 100 mg by mouth at bedtime. (Patient not taking: Reported on 06/05/2022)     QUEtiapine (SEROQUEL) 300 MG tablet Take 300 mg by mouth at bedtime.     roflumilast (DALIRESP) 500 MCG TABS tablet Take 500 mcg by mouth daily.     rosuvastatin (CRESTOR) 40 MG tablet Take 40 mg by mouth at bedtime.     TRELEGY ELLIPTA 100-62.5-25 MCG/INH AEPB Inhale 1 puff into the lungs daily.     valsartan (DIOVAN) 80 MG tablet Take 1 tablet (80 mg total) by mouth daily. 30 tablet 0    Musculoskeletal: Strength & Muscle Tone: within normal limits Gait & Station: normal Patient leans: N/A  Psychiatric Specialty Exam:  Presentation  General Appearance:  Appropriate for Environment  Eye Contact: Fair  Speech: Clear and Coherent  Speech Volume: Normal  Handedness: Right   Mood and Affect  Mood: Euthymic; Anxious  Affect: Full Range   Thought Process  Thought Processes: Coherent  Descriptions of Associations:Circumstantial  Orientation:Full (Time, Place and Person)  Thought Content:Delusions  History of Schizophrenia/Schizoaffective disorder:Yes  Duration of Psychotic Symptoms:Greater than  six months  Hallucinations:Hallucinations: Visual; Tactile  Ideas of Reference:Paranoia; Delusions  Suicidal Thoughts:Suicidal Thoughts: No  Homicidal Thoughts:Homicidal Thoughts: No   Sensorium  Memory: Immediate Fair; Remote Fair  Judgment: Impaired  Insight: Fair   Chartered certified accountant: Fair  Attention Span: Fair  Recall: [employer] of Knowledge: Fair  Language: Fair   Psychomotor Activity  Psychomotor Activity: Psychomotor Activity: Normal   Assets  Assets: Desire for Improvement; Financial Resources/Insurance; Housing; Social Support   Sleep  Sleep: Sleep: Poor   Physical Exam: Physical Exam ROS Blood pressure (!) 163/96, pulse (!) 106, temperature 97.9 F (36.6 C), temperature source Oral, resp. rate 19, height 4\' 11"  (1.499 m), weight 83.1 kg, SpO2 95 %. Body mass index is 37 kg/m.  Treatment Plan Summary: Daily contact with patient to assess and evaluate symptoms and progress in treatment and Medication management  Disposition: Recommend psychiatric Inpatient admission when medically cleared. Supportive therapy provided about ongoing stressors. Discussed crisis plan, support from social network, calling 911, coming to the Emergency Department, and calling Suicide Hotline.  , NP 09/16/2022 2:32 AM

## 2022-09-16 NOTE — ED Notes (Signed)
Pharmacy called to update pt's home meds

## 2022-09-16 NOTE — BH Assessment (Signed)
Referral information for Psychiatric Hospitalization faxed to;   [facility] [phone]),   [facility] [phone] -[phone]  [facility] [phone]),  [place] [phone])  [address] [phone]),   [place] [phone] -or- [phone]),   [facility] [phone])  [facility] [phone] or [phone]),   [facility] [phone]).  [facility] [phone])

## 2022-09-16 NOTE — ED Notes (Signed)
This RN to bedside for initial encounter. Pt. Is conversational, pleasant, and in NAD. Pt.

## 2022-09-16 NOTE — BH Assessment (Signed)
2239 Received patient sitting in the wheel chair She is alert and oriented x 3. Patient is unstable on her feet but is able to walk due to being short of breath. Patient states she is on oxygen at night when she is home. Report received from the ER nurse stated patient did not require oxygen. Spoke with [facility] on duty and informed of this Probation officer would consult provider on call for further orders.   Patient reports that she came into the ER because she was hallucinations seeing animals. She is currently not hearing or seeing any animals. However she does state that when it get dark she can hear, see and smell them and the smell is awefull. She is pleasant and cooperative.  Patient is currently denying SI, HI, and states she has a headache with a pain level of (10). Her current blood pressure 177/91 R 26 P 102. She is also endorsing depression and anxious. She received a Xanax 0.5 mg prior to being transfer.  2330 Spoke to the provider on call to inform that patient reports that she uses 2.5 liters at bedtime at home.   0200 Patient is resting quietly in bed with 02 in place. No resp dificulty noted at this time. Patient has been safe with 02 tubing. Will continue to monitor patient for safety and resp distress. Patient up to rest room without difficulty.  0430 No unsafe behavior noted with 02 tubing. No resp distress noted.  0630 Patient has rested quietly in bed most of this shift, there has been no resp distress or unsafe behavior noted. Will continue to monitor patient for safety.

## 2022-09-16 NOTE — BH Assessment (Signed)
Patient is to be admitted to [facility] by Psychiatric Nurse Practitioner [place].  Attending Physician will be Dr.  Louis Meckel .   Patient has been assigned to room L29, by [facility] Charge Nurse Grayland Ormond.   Intake Paper Work has been signed and placed on patient chart.  ER staff is aware of the admission: Darlene, ER Secretary   Dr. Cinda Quest, ER MD  Arrie Aran, Patient's Nurse  Levada Dy, Patient Access.   Pt pending admission orders. Pt can be transported at 10:30 PM if possible.

## 2022-09-16 NOTE — ED Notes (Signed)
VOL, pend placement 

## 2022-09-16 NOTE — ED Provider Notes (Signed)
12:19 AM  Assumed care at shift change.  Patient here with pleuritic chest pain.  Second troponin in process.  Needs CTA chest.  H/o COPD.  1:20 AM  Pt's second troponin is negative.  CTA of the chest reviewed/interpreted by myself and the radiologist and shows no PE, edema, pneumothorax, infiltrate.  She does have an 11 mm left upper lobe pulmonary nodule that was not present on CT scan in March.  Have referred to the pulmonary nodule clinic.  Patient still complaining of hallucinations and scared that she will harm herself.  She is requesting psychiatric evaluation.  She is voluntary at this time.  CT head was also obtained and reviewed and interpreted by myself and the radiologist and shows no acute abnormality.  Drug screen positive for tricyclics and benzodiazepines which it appears she is prescribed.  Urinalysis does not appear infected.  2:40 AM  Pt will be admitted to inpatient psych for psychosis.  Will be voluntary at this time.   Dorn Hartshorne, Delice Bison, DO 09/16/22 [phone]

## 2022-09-16 NOTE — ED Notes (Signed)
Report received from Dawn RN. 

## 2022-09-16 NOTE — ED Notes (Signed)
Items collected from the pt.  Sports Bra Pants Socks Boots Cigarettes and lighter in pocket Comb  Red personal bag in room also removed.   Cords removed from room.

## 2022-09-16 NOTE — ED Notes (Signed)
Patient provided with dinner tray, currently has a visitor and she is calm and cooperative

## 2022-09-16 NOTE — ED Notes (Signed)
Pt still needs to be dressed out and have Ivs removed. Pt was just medically cleared, pending psych consult.

## 2022-09-16 NOTE — ED Notes (Signed)
Pt given lunch tray, tolerating well.

## 2022-09-17 DIAGNOSIS — F209 Schizophrenia, unspecified: Secondary | ICD-10-CM | POA: Diagnosis not present

## 2022-09-17 MED ORDER — ALPRAZOLAM 0.5 MG PO TABS
0.5000 mg | ORAL_TABLET | Freq: Three times a day (TID) | ORAL | Status: DC | PRN
  Administered 2022-09-17 – 2022-09-19 (×5): 0.5 mg via ORAL
  Filled 2022-09-17 (×5): qty 1

## 2022-09-17 MED ORDER — BUPROPION HCL ER (XL) 150 MG PO TB24
150.0000 mg | ORAL_TABLET | Freq: Every day | ORAL | Status: DC
  Administered 2022-09-17 – 2022-09-24 (×8): 150 mg via ORAL
  Filled 2022-09-17 (×8): qty 1

## 2022-09-17 MED ORDER — CHLORPROMAZINE HCL 50 MG PO TABS
50.0000 mg | ORAL_TABLET | Freq: Every day | ORAL | Status: DC
  Administered 2022-09-17 – 2022-09-18 (×2): 50 mg via ORAL
  Filled 2022-09-17 (×2): qty 1

## 2022-09-17 NOTE — [facility] Suicide Risk Assessment (Signed)
[place] INPATIENT:  Family/Significant Other Suicide Prevention Education  Suicide Prevention Education:  Patient Refusal for Family/Significant Other Suicide Prevention Education: The patient Krystal Lee has refused to provide written consent for family/significant other to be provided Family/Significant Other Suicide Prevention Education during admission and/or prior to discharge.  Physician notified. SPE completed with pt, as pt refused to consent to family contact. SPI pamphlet provided to pt and pt was encouraged to share information with support network, ask questions, and talk about any concerns relating to SPE. Pt denies access to guns/firearms and verbalized understanding of information provided. Mobile Crisis information also provided to pt.    Rozann Lesches 09/17/2022, 11:05 AM

## 2022-09-17 NOTE — [facility] Counselor (Signed)
Adult Comprehensive Assessment  Patient ID: Krystal Lee, female   DOB: 1956-01-24, 66 y.o.   MRN: [id number]  Information Source: Information source: Patient  Current Stressors:  Patient states their primary concerns and needs for treatment are:: "critters that I've been seeing and hearing" Patient states their goals for this hospitilization and ongoing recovery are:: "make the hallucinations stop" Educational / Learning stressors: Pt denies. Employment / Job issues: Pt denies. Family Relationships: Pt denies. Financial / Lack of resources (include bankruptcy): Pt denies. Housing / Lack of housing: Pt denies. Physical health (include injuries & life threatening diseases): "back pain, COPD, CHF" Social relationships: Pt denies. Substance abuse: Pt denies. Bereavement / Loss: Pt denies.  Living/Environment/Situation:  Living Arrangements: Alone How long has patient lived in current situation?: "8 years" What is atmosphere in current home: Comfortable, Other (Comment) ("peaceful")  Family History:  Marital status: Widowed Widowed, when?: "2008" Does patient have children?: No  Childhood History:  By whom was/is the patient raised?: Both parents Description of patient's relationship with caregiver when they were a child: "great" Patient's description of current relationship with people who raised him/her: Patrient reports that her parents are deceased. How were you disciplined when you got in trouble as a child/adolescent?: "it was different depending on what I got in trouble for.  It was whoopings at times" Does patient have siblings?: Yes Number of Siblings: 5 Description of patient's current relationship with siblings: "all good" Did patient suffer any verbal/emotional/physical/sexual abuse as a child?: No Did patient suffer from severe childhood neglect?: No Has patient ever been sexually abused/assaulted/raped as an adolescent or adult?: No Was the patient ever a victim  of a crime or a disaster?: No Witnessed domestic violence?: No Has patient been affected by domestic violence as an adult?: No  Education:  Highest grade of school patient has completed: "11th" Currently a student?: No Learning disability?: No  Employment/Work Situation:   Employment Situation: On disability Why is Patient on Disability: "Schizoaffective" How Long has Patient Been on Disability: "2014" What is the Longest Time Patient has Held a Job?: "5 years" Where was the Patient Employed at that Time?: "[employer]" Has Patient ever Been in the [employer]?: No  Financial Resources:   Museum/gallery curator resources: Teacher, early years/pre, [employer], Medicare Does patient have a Programmer, applications or guardian?: No  Alcohol/Substance Abuse:   What has been your use of drugs/alcohol within the last 12 months?: "I drink on football game days, out of a 6 pack I may have 3 or 4"  Pt reports extensive history of SU. If attempted suicide, did drugs/alcohol play a role in this?: No Alcohol/Substance Abuse Treatment Hx: Past Tx, Inpatient, Past Tx, Outpatient, Relapse prevention program If yes, describe treatment: [facility], inpatient and outpatient hx Has alcohol/substance abuse ever caused legal problems?: Yes ("a lot but all in the past. 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 Patient states these barriers may affect/interfere with their treatment:  Pt denies.  Discharge Plan:   Currently  receiving community mental health services: No Patient states concerns and preferences for aftercare planning are: Pt reports that she is open to a referral for a therapist and psychiatrist at discharge. Patient states they will know when they are safe and ready for discharge when: "I wont know.  I'm waiting on yall to tell me" Does patient have access to transportation?: Yes Does patient have financial barriers related to discharge medications?: No Will patient be returning to same living situation after discharge?: Yes  Summary/Recommendations:   Summary and Recommendations (to be completed by the evaluator): Patient is a 66 year old widowed female from [place], [place] [facility]  She presents to the hospital following concerns for auditory and visual hallucinations.  Patient reports that she sees and hears animals in her home.  She reports that at times she can feel the animals as they rub across her legs when she is in bed.  She identifies her family as a support system.  She reports that her family has helped her look for these animals and have no evidence of them including no droppings.  She reports that both she and her family thought it best that patient come to the hospital. Patient reports history of this happening before that was addressed with medications, however, the medications are not apparently effective.  She reports that she does not have a current mental health therapist, though is open to a referral.  She reports that she drinks alcohol when watching football games, about three to four drinks out of a six pack.  Recommendations include: crisis stabilization, therapeutic milieu, encourage group attendance and participation, medication management for mood stabilization and development of comprehensive mental wellness/sobriety plan.  Rozann Lesches. 09/17/2022

## 2022-09-17 NOTE — Group Note (Signed)
[facility] LCSW Group Therapy Note    Group Date: 09/17/2022 Start Time: 7829 End Time: 1405  Type of Therapy and Topic:  Group Therapy:  Overcoming Obstacles  Participation Level:  [facility] PARTICIPATION LEVEL: Active  Mood:  Description of Group:   In this group patients will be encouraged to explore what they see as obstacles to their own wellness and recovery. They will be guided to discuss their thoughts, feelings, and behaviors related to these obstacles. The group will process together ways to cope with barriers, with attention given to specific choices patients can make. Each patient will be challenged to identify changes they are motivated to make in order to overcome their obstacles. This group will be process-oriented, with patients participating in exploration of their own experiences as well as giving and receiving support and challenge from other group members.  Therapeutic Goals: 1. Patient will identify personal and current obstacles as they relate to admission. 2. Patient will identify barriers that currently interfere with their wellness or overcoming obstacles.  3. Patient will identify feelings, thought process and behaviors related to these barriers. 4. Patient will identify two changes they are willing to make to overcome these obstacles:    Summary of Patient Progress Patient was present in group.  Patient was an active participant and engaged in group.  Patient identified her mental health as an obstacle, siting her reported hallucinations.  Patient was engaged and supportive of other group members.    Therapeutic Modalities:   Cognitive Behavioral Therapy Solution Focused Therapy Motivational Interviewing Relapse Prevention Therapy   Rozann Lesches, LCSW

## 2022-09-17 NOTE — Plan of Care (Signed)
  Problem: Education: Goal: Knowledge of General Education information will improve Description: Including pain rating scale, medication(s)/side effects and non-pharmacologic comfort measures Outcome: Progressing   Problem: Clinical Measurements: Goal: Respiratory complications will improve Outcome: Progressing   Problem: Activity: Goal: Risk for activity intolerance will decrease Outcome: Progressing   Problem: Coping: Goal: Level of anxiety will decrease Outcome: Progressing   Problem: Pain Managment: Goal: General experience of comfort will improve Outcome: Progressing   Problem: Safety: Goal: Ability to remain free from injury will improve Outcome: Progressing  Patient alert and talkative during assessment.  Patient is medication compliant.  Patient denies depression VH, SI/HI and pain but endorses visual hallucinations and anxiety,  Patient medicated x1 for anxiety,  Patient out of room in dayroom watching TV.  No respiratory distress noted.  Patient has has no injury.  Patient can contract for safety.  Q 15 minute rounds in progress, will continue to monitor.

## 2022-09-17 NOTE — [facility] Suicide Risk Assessment (Signed)
[facility] Admission Suicide Risk Assessment   Nursing information obtained from:  Patient Demographic factors:  Age 64 or older, Caucasian, Living alone Current Mental Status:   (Patient denying SI/HI,) Loss Factors:    Historical Factors:    Risk Reduction Factors:     Total Time spent with patient: 1 hour Principal Problem: Schizophrenia (HCC) Diagnosis:  Principal Problem:   Schizophrenia (HCC)  Subjective Data: Krystal Lee, 66 y.o., female with PMH of COPD, HTN, depression, as well as schizophrenia.   patient seen by this provider; chart reviewed and consulted with Dr. Elesa Massed on 09/16/22.  Patient was originally seen by Dr. Darnelle Catalan for complaints of pleuritic chest pain in the left anterior chest along with increasing shortness of breath. Per Dr. Darnelle Catalan, she does not have a productive cough she does not have a fever.  She is tachycardic with a low blood pressure 101/76.  O2 sats are good on oxygen.  She reports increasing oxygen requirement.  She does take Lasix.      On evaluation Krystal Lee reports she has been seeing rats and cats, has been attempting to kill the rats by hitting them with rooms with a bat that she keeps next to her bed. She says she has had an episode like this 3 years ago and beating the bed with a bat used to work. She says its no longer effective and she keeps seeing, hearing and feeling them run across the room. She also reports hand coming out of the wall and says she knows that's not real but wishes the visions would stop.  She states she used to take Zyprexa which was effective but that she could not tolerate the side effect and was switched to Seroquel 100 mg tablet nightly.  Patient states that she really needs help.  She denies SI but says she is scared that she may run into the street to stop the "critters" from chasing her. She has had family members come to her house to check and is very disappointed that they never see what she sees. Patient is been  increasingly confused and paranoid. Her sister finally convinced her to come to the hospital.  She has had decreased sleep for the last few  nights.   Continued Clinical Symptoms:    The "Alcohol Use Disorders Identification Test", Guidelines for Use in Primary Care, Second Edition.  World Science writer [facility]). Score between 0-7:  no or low risk or alcohol related problems. Score between 8-15:  moderate risk of alcohol related problems. Score between 16-19:  high risk of alcohol related problems. Score 20 or above:  warrants further diagnostic evaluation for alcohol dependence and treatment.   CLINICAL FACTORS:   Depression:   Anhedonia Insomnia Medical Diagnoses and Treatments/Surgeries   Musculoskeletal: Strength & Muscle Tone: within normal limits Gait & Station: normal Patient leans: N/A  Psychiatric Specialty Exam:  Presentation  General Appearance:  Appropriate for Environment  Eye Contact: Fair  Speech: Clear and Coherent  Speech Volume: Normal  Handedness: Right   Mood and Affect  Mood: Euthymic; Anxious  Affect: Full Range   Thought Process  Thought Processes: Coherent  Descriptions of Associations:Circumstantial  Orientation:Full (Time, Place and Person)  Thought Content:Delusions  History of Schizophrenia/Schizoaffective disorder:Yes  Duration of Psychotic Symptoms:Greater than six months  Hallucinations:Hallucinations: Visual; Tactile  Ideas of Reference:Paranoia; Delusions  Suicidal Thoughts:Suicidal Thoughts: No  Homicidal Thoughts:Homicidal Thoughts: No   Sensorium  Memory: Immediate Fair; Remote Fair  Judgment: Impaired  Insight: Fair  Executive Functions  Concentration: Fair  Attention Span: Fair  Recall: [employer] of Knowledge: Fair  Language: Fair   Psychomotor Activity  Psychomotor Activity: Psychomotor Activity: Normal   Assets  Assets: Desire for Improvement; Financial  Resources/Insurance; Housing; Social Support   Sleep  Sleep: Sleep: Poor     Blood pressure 114/61, pulse 75, temperature 98.3 F (36.8 C), temperature source Oral, resp. rate 18, height 4\' 11"  (1.499 m), SpO2 98 %. Body mass index is 37 kg/m.   COGNITIVE FEATURES THAT CONTRIBUTE TO RISK:  None    SUICIDE RISK:   Minimal: No identifiable suicidal ideation.  Patients presenting with no risk factors but with morbid ruminations; may be classified as minimal risk based on the severity of the depressive symptoms  PLAN OF CARE: See orders  I certify that inpatient services furnished can reasonably be expected to improve the patient's condition.   [name], DO 09/17/2022, 10:05 AM

## 2022-09-17 NOTE — H&P (Signed)
Psychiatric Admission Assessment Adult  Patient Identification: Krystal Lee MRN:  [id number] Date of Evaluation:  09/17/2022 Chief Complaint:  Schizophrenia ([place]) [F20.9] Principal Diagnosis: Schizophrenia ([place]) Diagnosis:  Principal Problem:   Schizophrenia ([place])  History of Present Illness: Krystal Lee is a 66 year old white female who is voluntarily admitted to inpatient psychiatry for depression and visual hallucinations.  [name] says that she has visions of animals and hears animals scratching at night.  She has a long history of COPD and wears oxygen at nighttime.  She denies any auditory hallucinations except for those related to her visual hallucinations.  She says this has been going on for about 2 months and she is trying to play around with her medications to see if that is the problem.  She is on blood pressure medication and Seroquel.  She has been on Seroquel for the past 5 years and now that she is decreased to 200 mg is not helping her sleep.  She does complain of weight gain from it.  She currently lives alone in an apartment in [place] and has a lot of family help.  She has a history of hypertension, COPD, chronic back pain and aortic stenosis.  The last psychiatrist she saw was about 5 years ago in [place].  Dr. Rosine Door.  She has a past history of heroin and alcohol abuse and was in prison for check fraud and aggressive behavior.  She has been clean and sober for many years and has been off cigarettes off and on.  She currently denies any suicidal ideation.  She is looking to solve her depression and visual hallucinations.  She says that Seroquel worked at first but is not at a lower dose.  I spoke to her about Wellbutrin and Thorazine and she is in agreement.  PER INITIAL INTAKE: Krystal Lee, 66 y.o., female with PMH of COPD, HTN, depression, as well as schizophrenia.   patient seen by this provider; chart reviewed and consulted with Dr. Leonides Schanz on 09/16/22.  Patient was  originally seen by Dr. Cinda Quest for complaints of pleuritic chest pain in the left anterior chest along with increasing shortness of breath. Per Dr. Cinda Quest, she does not have a productive cough she does not have a fever.  She is tachycardic with a low blood pressure 101/76.  O2 sats are good on oxygen.  She reports increasing oxygen requirement.  She does take Lasix.      On evaluation Krystal Lee reports she has been seeing rats and cats, has been attempting to kill the rats by hitting them with rooms with a bat that she keeps next to her bed. She says she has had an episode like this 3 years ago and beating the bed with a bat used to work. She says its no longer effective and she keeps seeing, hearing and feeling them run across the room. She also reports hand coming out of the wall and says she knows that's not real but wishes the visions would stop.  She states she used to take Zyprexa which was effective but that she could not tolerate the side effect and was switched to Seroquel 100 mg tablet nightly.  Patient states that she really needs help.  She denies SI but says she is scared that she may run into the street to stop the "critters" from chasing her. She has had family members come to her house to check and is very disappointed that they never see what she sees. Patient is been increasingly  confused and paranoid. Her sister finally convinced her to come to the hospital.  She has had decreased sleep for the last few  nights.   Associated Signs/Symptoms: Depression Symptoms:  depressed mood, insomnia, anxiety, weight gain, Duration of Depression Symptoms: No data recorded (Hypo) Manic Symptoms:  Hallucinations, Anxiety Symptoms:  Excessive Worry, Social Anxiety, Psychotic Symptoms:  Hallucinations: Visual PTSD Symptoms: Had a traumatic exposure:  Prison Total Time spent with patient: 1 hour  Past Psychiatric History: Quite extensive.  She has not seen a psychiatrist in 5 years and the  last one was Dr. Rosine Door in [place].  She has been psychiatrically hospitalized one time prior here at [facility].  She has been involved in outpatient with numerous psychiatrists in [place] including Dr. Lacinda Axon for and Dr. Gretel Acre. She went through [facility] in 2011.  Is the patient at risk to self? Yes.    Has the patient been a risk to self in the past 6 months? Yes.    Has the patient been a risk to self within the distant past? Yes.    Is the patient a risk to others? No.  Has the patient been a risk to others in the past 6 months? No.  Has the patient been a risk to others within the distant past? Yes.     [place] Scale:  [place] Admission (Current) from 09/16/2022 in [place] ED from 09/15/2022 in [place] ED to Hosp-Admission (Discharged) from 05/18/2021 in [place] No Risk No Risk No Risk        Prior Inpatient Therapy:   Prior Outpatient Therapy:    Alcohol Screening:   Substance Abuse History in the last 12 months:  No. Consequences of Substance Abuse: NA Previous Psychotropic Medications: Yes  Psychological Evaluations: Yes  Past Medical History:  Past Medical History:  Diagnosis Date   Anxiety    Arthritis    knees   Bipolar 1 disorder (HCC)    COPD (chronic obstructive pulmonary disease) (HCC)    Depression    GERD (gastroesophageal reflux disease)    Headache    during allergy season   Hepatitis C virus    Hypertension    Post-menopausal    Schizoaffective disorder ([place])     Past Surgical History:  Procedure Laterality Date   CATARACT EXTRACTION  2010   right    CATARACT EXTRACTION W/PHACO Left 06/09/2019   Procedure: CATARACT EXTRACTION PHACO AND INTRAOCULAR LENS PLACEMENT ([place]) COMPLICATED  LEFT;  Surgeon: Birder Robson, MD;  Location: [facility];  Service: Ophthalmology;  Laterality: Left;  VISION BLUE   OMIDRIA   LOWER EXTREMITY ANGIOGRAPHY Bilateral 08/31/2013   [facility], Dr. Lucky Cowboy   Family History:  Family History  Problem Relation Age of Onset   Breast cancer Paternal Grandmother    Bone cancer Paternal Grandmother    Pancreatic cancer Mother    Heart disease Father    Heart disease Paternal Uncle    Stroke Paternal Grandfather    Family Psychiatric  History: Unknown Tobacco Screening:   Social History:  Social History   Substance and Sexual Activity  Alcohol Use Not Currently   Alcohol/week: 1.0 standard drink of alcohol   Types: 1 Cans of beer per week     Social History   Substance and Sexual Activity  Drug Use No    Additional Social History:  Allergies:  No Known Allergies Lab Results:  Results for orders placed or performed during the hospital encounter of 09/15/22 (from the past 48 hour(s))  Comprehensive metabolic panel     Status: Abnormal   Collection Time: 09/15/22  8:33 PM  Result Value Ref Range   Sodium 142 135 - 145 mmol/L   Potassium 3.6 3.5 - 5.1 mmol/L   Chloride 114 (H) 98 - 111 mmol/L   CO2 19 (L) 22 - 32 mmol/L   Glucose, Bld 158 (H) 70 - 99 mg/dL    Comment: Glucose reference range applies only to samples taken after fasting for at least 8 hours.   BUN 13 8 - 23 mg/dL   Creatinine, Ser 0.64 0.44 - 1.00 mg/dL   Calcium 9.0 8.9 - 10.3 mg/dL   Total Protein 6.8 6.5 - 8.1 g/dL   Albumin 4.0 3.5 - 5.0 g/dL   AST 21 15 - 41 U/L   ALT 19 0 - 44 U/L   Alkaline Phosphatase 63 38 - 126 U/L   Total Bilirubin 0.5 0.3 - 1.2 mg/dL   GFR, Estimated >60 >60 mL/min    Comment: (NOTE) Calculated using the CKD-EPI Creatinine Equation (2021)    Anion gap 9 5 - 15    Comment: Performed at [facility], [address] [place], [place] [address]  Ethanol     Status: None   Collection Time: 09/15/22  8:33 PM  Result Value Ref Range   Alcohol, Ethyl (B) <10 <10 mg/dL    Comment: (NOTE) Lowest detectable limit  for serum alcohol is 10 mg/dL.  For medical purposes only. Performed at [facility], [address] [place], [place] [id number]   Salicylate level     Status: Abnormal   Collection Time: 09/15/22  8:33 PM  Result Value Ref Range   Salicylate Lvl Q000111Q (L) 7.0 - 30.0 mg/dL    Comment: Performed at [facility], [address] [place], [place] [address]  Acetaminophen level     Status: Abnormal   Collection Time: 09/15/22  8:33 PM  Result Value Ref Range   Acetaminophen (Tylenol), Serum <10 (L) 10 - 30 ug/mL    Comment: (NOTE) Therapeutic concentrations vary significantly. A range of 10-30 ug/mL  may be an effective concentration for many patients. However, some  are best treated at concentrations outside of this range. Acetaminophen concentrations >150 ug/mL at 4 hours after ingestion  and >50 ug/mL at 12 hours after ingestion are often associated with  toxic reactions.  Performed at [facility], [place], [place] [address]   Troponin I (High Sensitivity)     Status: None   Collection Time: 09/15/22  8:33 PM  Result Value Ref Range   Troponin I (High Sensitivity) 5 <18 ng/L    Comment: (NOTE) Elevated high sensitivity troponin I (hsTnI) values and significant  changes across serial measurements may suggest ACS but many other  chronic and acute conditions are known to elevate hsTnI results.  Refer to the "Links" section for chest pain algorithms and additional  guidance. Performed at [facility], [address] [place], [place] [address]   CBC with Differential     Status: None   Collection Time: 09/15/22  8:33 PM  Result Value Ref Range   WBC 10.2 4.0 - 10.5 K/uL   RBC 4.14 3.87 - 5.11 MIL/uL   Hemoglobin 12.6 12.0 - 15.0 g/dL   HCT 38.0 36.0 - 46.0 %  MCV 91.8 80.0 - 100.0 fL   MCH 30.4 26.0 - 34.0 pg   MCHC 33.2 30.0 - 36.0 g/dL   RDW 13.3 11.5 - 15.5 %   Platelets 262 150 - 400 K/uL   nRBC 0.0 0.0 - 0.2 %    Neutrophils Relative % 67 %   Neutro Abs 6.9 1.7 - 7.7 K/uL   Lymphocytes Relative 24 %   Lymphs Abs 2.4 0.7 - 4.0 K/uL   Monocytes Relative 6 %   Monocytes Absolute 0.6 0.1 - 1.0 K/uL   Eosinophils Relative 2 %   Eosinophils Absolute 0.2 0.0 - 0.5 K/uL   Basophils Relative 1 %   Basophils Absolute 0.1 0.0 - 0.1 K/uL   Immature Granulocytes 0 %   Abs Immature Granulocytes 0.03 0.00 - 0.07 K/uL    Comment: Performed at [facility], [address] [place], [place] [address]  D-dimer, quantitative     Status: Abnormal   Collection Time: 09/15/22  8:33 PM  Result Value Ref Range   D-Dimer, Quant 0.58 (H) 0.00 - 0.50 ug/mL-FEU    Comment: (NOTE) At the manufacturer cut-off value of 0.5 g/mL FEU, this assay has a negative predictive value of 95-100%.This assay is intended for use in conjunction with a clinical pretest probability (PTP) assessment model to exclude pulmonary embolism (PE) and deep venous thrombosis (DVT) in outpatients suspected of PE or DVT. Results should be correlated with clinical presentation. Performed at [facility], [address] [place], [place] [address]   Urine Drug Screen, Qualitative     Status: Abnormal   Collection Time: 09/15/22  9:24 PM  Result Value Ref Range   Tricyclic, Ur Screen POSITIVE (A) NONE DETECTED   Amphetamines, Ur Screen NONE DETECTED NONE DETECTED   MDMA (Ecstasy)Ur Screen NONE DETECTED NONE DETECTED   Cocaine Metabolite,Ur [place] NONE DETECTED NONE DETECTED   Opiate, Ur Screen NONE DETECTED NONE DETECTED   Phencyclidine (PCP) Ur S NONE DETECTED NONE DETECTED   Cannabinoid 50 Ng, Ur [place] NONE DETECTED NONE DETECTED   Barbiturates, Ur Screen NONE DETECTED NONE DETECTED   Benzodiazepine, Ur Scrn POSITIVE (A) NONE DETECTED   Methadone Scn, Ur NONE DETECTED NONE DETECTED    Comment: (NOTE) Tricyclics + metabolites, urine    Cutoff 1000 ng/mL Amphetamines + metabolites, urine  Cutoff 1000 ng/mL MDMA (Ecstasy), urine               Cutoff 500 ng/mL Cocaine Metabolite, urine          Cutoff 300 ng/mL Opiate + metabolites, urine        Cutoff 300 ng/mL Phencyclidine (PCP), urine         Cutoff 25 ng/mL Cannabinoid, urine                 Cutoff 50 ng/mL Barbiturates + metabolites, urine  Cutoff 200 ng/mL Benzodiazepine, urine              Cutoff 200 ng/mL Methadone, urine                   Cutoff 300 ng/mL  The urine drug screen provides only a preliminary, unconfirmed analytical test result and should not be used for non-medical purposes. Clinical consideration and professional judgment should be applied to any positive drug screen result due to possible interfering substances. A more specific alternate chemical method must be used in order to obtain a confirmed analytical result. Gas chromatography / mass spectrometry (GC/MS) is the  preferred confirm atory method. Performed at [facility], [address] [place], [place] [address]   Urinalysis, Routine w reflex microscopic     Status: Abnormal   Collection Time: 09/15/22  9:24 PM  Result Value Ref Range   Color, Urine YELLOW (A) YELLOW   APPearance HAZY (A) CLEAR   Specific Gravity, Urine 1.023 1.005 - 1.030   pH 5.0 5.0 - 8.0   Glucose, UA NEGATIVE NEGATIVE mg/dL   Hgb urine dipstick NEGATIVE NEGATIVE   Bilirubin Urine NEGATIVE NEGATIVE   Ketones, ur 5 (A) NEGATIVE mg/dL   Protein, ur NEGATIVE NEGATIVE mg/dL   Nitrite NEGATIVE NEGATIVE   Leukocytes,Ua NEGATIVE NEGATIVE    Comment: Performed at [facility], [address] [place], [place] [address]  Brain natriuretic peptide     Status: None   Collection Time: 09/15/22  9:24 PM  Result Value Ref Range   B Natriuretic Peptide 23.4 0.0 - 100.0 pg/mL    Comment: Performed at [facility], [address] [place], [place] [address]  Blood gas, venous     Status: Abnormal   Collection Time: 09/16/22 12:05 AM  Result Value Ref Range   pH, Ven 7.36 7.25 - 7.43    pCO2, Ven 39 (L) 44 - 60 mmHg   pO2, Ven 40 32 - 45 mmHg   Bicarbonate 22.0 20.0 - 28.0 mmol/L   Acid-base deficit 3.1 (H) 0.0 - 2.0 mmol/L   O2 Saturation 60.8 %   Patient temperature 37.0    Collection site VEIN     Comment: Performed at [facility], [place], [place] [address]  Troponin I (High Sensitivity)     Status: None   Collection Time: 09/16/22 12:05 AM  Result Value Ref Range   Troponin I (High Sensitivity) 6 <18 ng/L    Comment: (NOTE) Elevated high sensitivity troponin I (hsTnI) values and significant  changes across serial measurements may suggest ACS but many other  chronic and acute conditions are known to elevate hsTnI results.  Refer to the "Links" section for chest pain algorithms and additional  guidance. Performed at [facility], [address] [place], [place] [address]   Resp Panel by RT-PCR (Flu A&B, Covid) Anterior Nasal Swab     Status: None   Collection Time: 09/16/22  1:00 AM   Specimen: Anterior Nasal Swab  Result Value Ref Range   SARS Coronavirus 2 by RT PCR NEGATIVE NEGATIVE    Comment: (NOTE) SARS-CoV-2 target nucleic acids are NOT DETECTED.  The SARS-CoV-2 RNA is generally detectable in upper respiratory specimens during the acute phase of infection. The lowest concentration of SARS-CoV-2 viral copies this assay can detect is 138 copies/mL. A negative result does not preclude SARS-Cov-2 infection and should not be used as the sole basis for treatment or other patient management decisions. A negative result may occur with  improper specimen collection/handling, submission of specimen other than nasopharyngeal swab, presence of viral mutation(s) within the areas targeted by this assay, and inadequate number of viral copies(<138 copies/mL). A negative result must be combined with clinical observations, patient history, and epidemiological information. The expected result is Negative.  Fact Sheet for  Patients:  EntrepreneurPulse.com.au  Fact Sheet for Healthcare Providers:  IncredibleEmployment.be  This test is no t yet approved or cleared by the [place] FDA and  has been authorized for detection and/or diagnosis of SARS-CoV-2 by FDA under an Emergency Use Authorization (EUA). This EUA will remain  in effect (meaning this test can be used) for the duration of the COVID-19 declaration under Section 564(b)(1) of the Act, 21 U.S.C.section 360bbb-3(b)(1), unless the authorization is terminated  or revoked sooner.       Influenza A by PCR NEGATIVE NEGATIVE   Influenza B by PCR NEGATIVE NEGATIVE    Comment: (NOTE) The Xpert Xpress SARS-CoV-2/FLU/RSV plus assay is intended as an aid in the diagnosis of influenza from Nasopharyngeal swab specimens and should not be used as a sole basis for treatment. Nasal washings and aspirates are unacceptable for Xpert Xpress SARS-CoV-2/FLU/RSV testing.  Fact Sheet for Patients: EntrepreneurPulse.com.au  Fact Sheet for Healthcare Providers: IncredibleEmployment.be  This test is not yet approved or cleared by the [place] FDA and has been authorized for detection and/or diagnosis of SARS-CoV-2 by FDA under an Emergency Use Authorization (EUA). This EUA will remain in effect (meaning this test can be used) for the duration of the COVID-19 declaration under Section 564(b)(1) of the Act, 21 U.S.C. section 360bbb-3(b)(1), unless the authorization is terminated or revoked.  Performed at [facility], [address] [place], [place] [address]     Blood Alcohol level:  Lab Results  Component Value Date   ETH <10 09/15/2022   ETH 309 ([place]) 0000000    Metabolic Disorder Labs:  No results found for: "HGBA1C", "MPG" No results found for: "PROLACTIN" No results found for: "CHOL", "TRIG", "HDL", "CHOLHDL", "VLDL", "LDLCALC"  Current  Medications: Current Facility-Administered Medications  Medication Dose Route Frequency Provider Last Rate Last Admin   acetaminophen (TYLENOL) tablet 650 mg  650 mg Oral Q6H PRN [name], [name], NP       albuterol (PROVENTIL) (2.5 MG/3ML) 0.083% nebulizer solution 2.5 mg  2.5 mg Nebulization Q6H PRN [name], [name], NP       albuterol (VENTOLIN HFA) 108 (90 Base) MCG/ACT inhaler 2 puff  2 puff Inhalation Q4H [name], [name], NP   2 puff at 09/17/22 0950   alum & mag hydroxide-simeth (MAALOX/MYLANTA) 200-200-20 MG/5ML suspension 30 mL  30 mL Oral Q4H PRN [name], [name], NP       fluticasone furoate-vilanterol (BREO ELLIPTA) 100-25 MCG/ACT 1 puff  1 puff Inhalation Daily [name], NP   1 puff at 09/17/22 0951   hydrOXYzine (ATARAX) tablet 25 mg  25 mg Oral TID PRN [name], NP       magnesium hydroxide (MILK OF MAGNESIA) suspension 30 mL  30 mL Oral Daily PRN [name], NP       QUEtiapine (SEROQUEL) tablet 100 mg  100 mg Oral QHS [name], [name], NP   100 mg at 09/17/22 0117   traZODone (DESYREL) tablet 50 mg  50 mg Oral QHS PRN [name], NP       PTA Medications: Medications Prior to Admission  Medication Sig Dispense Refill Last Dose   albuterol (PROVENTIL) (2.5 MG/3ML) 0.083% nebulizer solution Take 2.5 mg by nebulization every 6 (six) hours as needed for wheezing or shortness of breath.      albuterol (VENTOLIN HFA) 108 (90 Base) MCG/ACT inhaler Inhale 2 puffs into the lungs every 4 (four) hours as needed for shortness of breath or wheezing.      ALPRAZolam (XANAX) 0.5 MG tablet Take 0.5 mg by mouth 3 (three) times daily as needed for anxiety.      cholecalciferol (VITAMIN D3) 25 MCG (1000 UNIT) tablet Take 1,000 Units by mouth daily.      Fluticasone-Umeclidin-Vilant 100-62.5-25  MCG/ACT AEPB Inhale 1 puff into the lungs daily.      furosemide (LASIX) 20 MG tablet Take 1 tablet (20 mg total) by mouth daily. 30 tablet     montelukast (SINGULAIR) 10 MG  tablet Take 10 mg by mouth at bedtime.      QUEtiapine (SEROQUEL) 100 MG tablet Take 100 mg by mouth at bedtime.      roflumilast (DALIRESP) 500 MCG TABS tablet Take 500 mcg by mouth daily.      rosuvastatin (CRESTOR) 40 MG tablet Take 40 mg by mouth at bedtime.      valsartan (DIOVAN) 80 MG tablet Take 1 tablet (80 mg total) by mouth daily. 30 tablet 0     Musculoskeletal: Strength & Muscle Tone: within normal limits Gait & Station: normal Patient leans: N/A            Psychiatric Specialty Exam:  Presentation  General Appearance:  Appropriate for Environment  Eye Contact: Fair  Speech: Clear and Coherent  Speech Volume: Normal  Handedness: Right   Mood and Affect  Mood: Euthymic; Anxious  Affect: Full Range   Thought Process  Thought Processes: Coherent  Duration of Psychotic Symptoms: Greater than six months  Past Diagnosis of Schizophrenia or Psychoactive disorder: Yes  Descriptions of Associations:Circumstantial  Orientation:Full (Time, Place and Person)  Thought Content:Delusions  Hallucinations:Hallucinations: Visual; Tactile  Ideas of Reference:Paranoia; Delusions  Suicidal Thoughts:Suicidal Thoughts: No  Homicidal Thoughts:Homicidal Thoughts: No   Sensorium  Memory: Immediate Fair; Remote Fair  Judgment: Impaired  Insight: Fair   Materials engineer: Fair  Attention Span: Fair  Recall: [employer] of Knowledge: Fair  Language: Fair   Psychomotor Activity  Psychomotor Activity: Psychomotor Activity: Normal   Assets  Assets: Desire for Improvement; Financial Resources/Insurance; Housing; Social Support   Sleep  Sleep: Sleep: Poor    Physical Exam: Physical Exam Constitutional:      Appearance: Normal appearance.  HENT:     Head: Normocephalic and atraumatic.     Mouth/Throat:     Pharynx: Oropharynx is clear.  Eyes:     Pupils: Pupils are equal, round, and reactive to  light.  Cardiovascular:     Rate and Rhythm: Normal rate and regular rhythm.  Pulmonary:     Effort: Pulmonary effort is normal.     Breath sounds: Normal breath sounds.  Abdominal:     General: Abdomen is flat.     Palpations: Abdomen is soft.  Musculoskeletal:        General: Normal range of motion.  Skin:    General: Skin is warm and dry.  Neurological:     General: No focal deficit present.     Mental Status: She is alert. Mental status is at baseline.  Psychiatric:        Attention and Perception: Attention normal. She perceives visual hallucinations.        Mood and Affect: Mood is anxious and depressed. Affect is flat.        Speech: Speech normal.        Behavior: Behavior is cooperative.        Thought Content: Thought content normal.        Cognition and Memory: Cognition and memory normal.        Judgment: Judgment normal.    Review of Systems  Constitutional: Negative.   HENT: Negative.    Eyes: Negative.   Respiratory: Negative.    Cardiovascular: Negative.   Gastrointestinal: Negative.   Genitourinary:  Negative.   Musculoskeletal: Negative.   Skin: Negative.   Neurological: Negative.   Endo/Heme/Allergies: Negative.   Psychiatric/Behavioral:  Positive for depression and hallucinations. The patient has insomnia.    Blood pressure 114/61, pulse 75, temperature 98.3 F (36.8 C), temperature source Oral, resp. rate 18, height 4\' 11"  (1.499 m), SpO2 98 %. Body mass index is 37 kg/m.  Treatment Plan Summary: Daily contact with patient to assess and evaluate symptoms and progress in treatment, Medication management, and Plan discontinue Seroquel and start Thorazine 50 mg at bedtime and Wellbutrin XL 150 mg in the morning.  Observation Level/Precautions:  15 minute checks  Laboratory:  CBC Chemistry Profile  Psychotherapy:    Medications:    Consultations:    Discharge Concerns:    Estimated LOS:  Other:     Physician Treatment Plan for Primary  Diagnosis: Schizophrenia ([place]) Long Term Goal(s): Improvement in symptoms so as ready for discharge  Short Term Goals: Ability to identify changes in lifestyle to reduce recurrence of condition will improve, Ability to verbalize feelings will improve, Ability to disclose and discuss suicidal ideas, Ability to demonstrate self-control will improve, Ability to identify and develop effective coping behaviors will improve, Ability to maintain clinical measurements within normal limits will improve, and Compliance with prescribed medications will improve  Physician Treatment Plan for Secondary Diagnosis: Principal Problem:   Schizophrenia ([place] [address])   I certify that inpatient services furnished can reasonably be expected to improve the patient's condition.    [place], DO 10/30/202310:07 AM

## 2022-09-17 NOTE — BH IP Treatment Plan (Addendum)
Interdisciplinary Treatment and Diagnostic Plan Update  09/17/2022 Time of Session: 9:30AM Krystal Lee MRN: [id number]  Principal Diagnosis: Schizophrenia [facility])  Secondary Diagnoses: Principal Problem:   Schizophrenia ([place])   Current Medications:  Current Facility-Administered Medications  Medication Dose Route Frequency Provider Last Rate Last Admin   acetaminophen (TYLENOL) tablet 650 mg  650 mg Oral Q6H PRN [name], [name], NP       albuterol (PROVENTIL) (2.5 MG/3ML) 0.083% nebulizer solution 2.5 mg  2.5 mg Nebulization Q6H PRN [name], NP       albuterol (VENTOLIN HFA) 108 (90 Base) MCG/ACT inhaler 2 puff  2 puff Inhalation Q4H [name], NP   2 puff at 09/17/22 0950   ALPRAZolam [name]) tablet 0.5 mg  0.5 mg Oral TID PRN [name], DO       alum & mag hydroxide-simeth (MAALOX/MYLANTA) 200-200-20 MG/5ML suspension 30 mL  30 mL Oral Q4H PRN [name], NP       buPROPion (WELLBUTRIN XL) 24 hr tablet 150 mg  150 mg Oral Daily [name], DO       chlorproMAZINE (THORAZINE) tablet 50 mg  50 mg Oral QHS Herrick, Richard Edward, DO       fluticasone furoate-vilanterol (BREO ELLIPTA) 100-25 MCG/ACT 1 puff  1 puff Inhalation Daily [name], NP   1 puff at 09/17/22 0951   hydrOXYzine (ATARAX) tablet 25 mg  25 mg Oral TID PRN [name], NP       magnesium hydroxide (MILK OF MAGNESIA) suspension 30 mL  30 mL Oral Daily PRN [name], [name], NP       traZODone (DESYREL) tablet 50 mg  50 mg Oral QHS PRN [name], NP       PTA Medications: Medications Prior to Admission  Medication Sig Dispense Refill Last Dose   albuterol (PROVENTIL) (2.5 MG/3ML) 0.083% nebulizer solution Take 2.5 mg by nebulization every 6 (six) hours as needed for wheezing or shortness of breath.      albuterol (VENTOLIN HFA) 108 (90 Base) MCG/ACT inhaler Inhale 2 puffs into the lungs every 4 (four) hours as needed for shortness of breath or  wheezing.      ALPRAZolam (XANAX) 0.5 MG tablet Take 0.5 mg by mouth 3 (three) times daily as needed for anxiety.      cholecalciferol (VITAMIN D3) 25 MCG (1000 UNIT) tablet Take 1,000 Units by mouth daily.      Fluticasone-Umeclidin-Vilant 100-62.5-25 MCG/ACT AEPB Inhale 1 puff into the lungs daily.      furosemide (LASIX) 20 MG tablet Take 1 tablet (20 mg total) by mouth daily. 30 tablet     montelukast (SINGULAIR) 10 MG tablet Take 10 mg by mouth at bedtime.      QUEtiapine (SEROQUEL) 100 MG tablet Take 100 mg by mouth at bedtime.      roflumilast (DALIRESP) 500 MCG TABS tablet Take 500 mcg by mouth daily.      rosuvastatin (CRESTOR) 40 MG tablet Take 40 mg by mouth at bedtime.      valsartan (DIOVAN) 80 MG tablet Take 1 tablet (80 mg total) by mouth daily. 30 tablet 0     Patient Stressors:    Patient Strengths:    Treatment Modalities: Medication Management, Group therapy, Case management,  1 to 1 session with clinician, Psychoeducation, Recreational therapy.   Physician Treatment Plan for Primary Diagnosis: Schizophrenia [facility]) Long Term Goal(s): Improvement in symptoms so as ready for discharge  Short Term Goals: Ability to identify changes in lifestyle to reduce recurrence of condition will improve Ability to verbalize feelings will improve Ability to disclose and discuss suicidal ideas Ability to demonstrate self-control will improve Ability to identify and develop effective coping behaviors will improve Ability to maintain clinical measurements within normal limits will improve Compliance with prescribed medications will improve  Medication Management: Evaluate patient's response, side effects, and tolerance of medication regimen.  Therapeutic Interventions: 1 to 1 sessions, Unit Group sessions and Medication administration.  Evaluation of Outcomes: Not Met  Physician Treatment Plan for Secondary Diagnosis: Principal Problem:   Schizophrenia ([place])  Long Term Goal(s):  Improvement in symptoms so as ready for discharge   Short Term Goals: Ability to identify changes in lifestyle to reduce recurrence of condition will improve Ability to verbalize feelings will improve Ability to disclose and discuss suicidal ideas Ability to demonstrate self-control will improve Ability to identify and develop effective coping behaviors will improve Ability to maintain clinical measurements within normal limits will improve Compliance with prescribed medications will improve     Medication Management: Evaluate patient's response, side effects, and tolerance of medication regimen.  Therapeutic Interventions: 1 to 1 sessions, Unit Group sessions and Medication administration.  Evaluation of Outcomes: Not Met   RN Treatment Plan for Primary Diagnosis: Schizophrenia ([place]) Long Term Goal(s): Knowledge of disease and therapeutic regimen to maintain health will improve  Short Term Goals: Ability to demonstrate self-control, Ability to participate in decision making will improve, Ability to verbalize feelings will improve, Ability to disclose and discuss suicidal ideas, Ability to identify and develop effective coping behaviors will improve, and Compliance with prescribed medications will improve  Medication Management: RN will administer medications as ordered by provider, will assess and evaluate patient's response and provide education to patient for prescribed medication. RN will report any adverse and/or side effects to prescribing provider.  Therapeutic Interventions: 1 on 1 counseling sessions, Psychoeducation, Medication administration, Evaluate responses to treatment, Monitor vital signs and CBGs as ordered, Perform/monitor CIWA, COWS, AIMS and Fall Risk screenings as ordered, Perform wound care treatments as ordered.  Evaluation of Outcomes: Not Met   LCSW Treatment Plan for Primary Diagnosis: Schizophrenia ([place]) Long Term Goal(s): Safe transition to appropriate next  level of care at discharge, Engage patient in therapeutic group addressing interpersonal concerns.  Short Term Goals: Engage patient in aftercare planning with referrals and resources, Increase social support, Increase ability to appropriately verbalize feelings, Increase emotional regulation, Facilitate acceptance of mental health diagnosis and concerns, and Increase skills for wellness and recovery  Therapeutic Interventions: Assess for all discharge needs, 1 to 1 time with Social worker, Explore available resources and support systems, Assess for adequacy in community support network, Educate family and significant other(s) on suicide prevention, Complete Psychosocial Assessment, Interpersonal group therapy.  Evaluation of Outcomes: Not Met   Progress in Treatment: Attending groups: No. Participating in groups: No. Taking medication as prescribed: Yes. Toleration medication: Yes. Family/Significant other contact made: No, will contact:  once permission is given. Patient understands diagnosis: Yes. Discussing patient identified problems/goals with staff: Yes. Medical problems stabilized or resolved: Yes. Denies suicidal/homicidal ideation: Yes. Issues/concerns per patient self-inventory: No. Other: none  New problem(s) identified: No, Describe:  none  New Short Term/Long Term Goal(s): elimination of symptoms of psychosis, medication management for mood stabilization; elimination of SI thoughts; development of comprehensive mental wellness/sobriety plan.   Patient Goals:  "make the hallucinations stop"  Discharge Plan or Barriers: CSW to assist with the  development of appropriate discharge plans.   Reason for Continuation of Hospitalization: Anxiety Depression Hallucinations Medical Issues Medication stabilization Suicidal ideation  Estimated Length of Stay:  1-7 days  Last 3 [place] Suicide Severity Risk Score: Flowsheet Row Admission (Current) from 09/16/2022 in [place] ED from 09/15/2022 in [place] ED to Hosp-Admission (Discharged) from 05/18/2021 in [place] No Risk No Risk No Risk       Last PHQ 2/9 Scores:     No data to display          Scribe for Treatment Team: Rozann Lesches, LCSW 09/17/2022 11:06 AM

## 2022-09-17 NOTE — Progress Notes (Signed)
Dar Note: Patient presents with anxious mood and affect.  Denies suicidal thoughts and visual hallucinations but reports hearing "scratching noise" during the night.  No acute respiratory distress noted or reported.  Medication given as prescribed.  Requested and received Xanax 0.5 mg for complain of anxiety with good effect.  Patient visible in milieu for meals and therapy.  Support and encouragement offered as needed.  Patient is safe on the unit.

## 2022-09-18 DIAGNOSIS — F209 Schizophrenia, unspecified: Secondary | ICD-10-CM | POA: Diagnosis not present

## 2022-09-18 MED ORDER — IRBESARTAN 75 MG PO TABS
75.0000 mg | ORAL_TABLET | Freq: Every day | ORAL | Status: DC
  Administered 2022-09-18 – 2022-09-19 (×2): 75 mg via ORAL
  Filled 2022-09-18 (×2): qty 1

## 2022-09-18 MED ORDER — PANTOPRAZOLE SODIUM 40 MG PO TBEC
40.0000 mg | DELAYED_RELEASE_TABLET | Freq: Every day | ORAL | Status: DC
  Administered 2022-09-18: 40 mg via ORAL
  Filled 2022-09-18 (×2): qty 1

## 2022-09-18 NOTE — Plan of Care (Signed)
  Problem: Clinical Measurements: Goal: Respiratory complications will improve Outcome: Progressing Goal: Cardiovascular complication will be avoided Outcome: Progressing   Problem: Activity: Goal: Risk for activity intolerance will decrease Outcome: Progressing   Problem: Nutrition: Goal: Adequate nutrition will be maintained Outcome: Progressing   

## 2022-09-18 NOTE — Progress Notes (Signed)
[facility] MD Progress Note  09/18/2022 1:02 PM Zakiah Simranjit Thayer  MRN:  [id number] Subjective: Krystal Lee is seen on rounds.  We discontinued her Seroquel and start her on Thorazine.  She states that she slept well and did not have any visual hallucinations.  She is taking her medications as prescribed and denies any side effects.  She states that she is feeling better.  He looked up Thorazine on her at night and notes to hear 1 which I think she should be able to afford it.  Principal Problem: Schizophrenia (HCC) Diagnosis: Principal Problem:   Schizophrenia (HCC)  Total Time spent with patient: 15 minutes  Past Psychiatric History: Quite extensive.  She has not seen a psychiatrist in 5 years and the last one was [name] in [place].  She has been psychiatrically hospitalized one time prior here at [facility].  She has been involved in outpatient with numerous psychiatrists in [place] including [name] for and [name]. She went through [facility] in 2011.  Past Medical History:  Past Medical History:  Diagnosis Date   Anxiety    Arthritis    knees   Bipolar 1 disorder (HCC)    COPD (chronic obstructive pulmonary disease) (HCC)    Depression    GERD (gastroesophageal reflux disease)    Headache    during allergy season   Hepatitis C virus    Hypertension    Post-menopausal    Schizoaffective disorder (HCC)     Past Surgical History:  Procedure Laterality Date   CATARACT EXTRACTION  2010   right    CATARACT EXTRACTION W/PHACO Left 06/09/2019   Procedure: CATARACT EXTRACTION PHACO AND INTRAOCULAR LENS PLACEMENT (IOC) COMPLICATED  LEFT;  Surgeon: [name], MD;  Location: [facility];  Service: Ophthalmology;  Laterality: Left;  VISION BLUE  [place]   LOWER EXTREMITY ANGIOGRAPHY Bilateral 08/31/2013   [facility], Dr. [name]   Family History:  Family History  Problem Relation Age of Onset   Breast cancer Paternal Grandmother    Bone cancer Paternal Grandmother    Pancreatic  cancer Mother    Heart disease Father    Heart disease Paternal Uncle    Stroke Paternal Grandfather     Social History:  Social History   Substance and Sexual Activity  Alcohol Use Not Currently   Alcohol/week: 1.0 standard drink of alcohol   Types: 1 Cans of beer per week     Social History   Substance and Sexual Activity  Drug Use No    Social History   Socioeconomic History   Marital status: Widowed    Spouse name: Not on file   Number of children: Not on file   Years of education: Not on file   Highest education level: Not on file  Occupational History   Not on file  Tobacco Use   Smoking status: Former    Packs/day: 1.00    Years: 45.00    Total pack years: 45.00    Types: Cigarettes    Quit date: 05/01/2021    Years since quitting: 1.3   Smokeless tobacco: Never   Tobacco comments:    since age 48  Vaping Use   Vaping Use: Never used  Substance and Sexual Activity   Alcohol use: Not Currently    Alcohol/week: 1.0 standard drink of alcohol    Types: 1 Cans of beer per week   Drug use: No   Sexual activity: Not on file  Other Topics Concern   Not on  file  Social History Narrative   Not on file   Social Determinants of Health   Financial Resource Strain: Not on file  Food Insecurity: Not on file  Transportation Needs: Not on file  Physical Activity: Not on file  Stress: Not on file  Social Connections: Not on file   Additional Social History:                         Sleep: Good  Appetite:  Good  Current Medications: Current Facility-Administered Medications  Medication Dose Route Frequency Provider Last Rate Last Admin   acetaminophen (TYLENOL) tablet 650 mg  650 mg Oral Q6H PRN [name], [name], NP       albuterol (PROVENTIL) (2.5 MG/3ML) 0.083% nebulizer solution 2.5 mg  2.5 mg Nebulization Q6H PRN [name], [name], NP       albuterol (VENTOLIN HFA) 108 (90 Base) MCG/ACT inhaler 2 puff  2 puff Inhalation Q4H [name], [name], NP    2 puff at 09/18/22 1159   ALPRAZolam (XANAX) tablet 0.5 mg  0.5 mg Oral TID PRN [name], DO   0.5 mg at 09/18/22 0749   alum & mag hydroxide-simeth (MAALOX/MYLANTA) 200-200-20 MG/5ML suspension 30 mL  30 mL Oral Q4H PRN [name], NP       buPROPion (WELLBUTRIN XL) 24 hr tablet 150 mg  150 mg Oral Daily [name], DO   150 mg at 09/18/22 7829   chlorproMAZINE (THORAZINE) tablet 50 mg  50 mg Oral QHS [name], DO   50 mg at 09/17/22 2135   fluticasone furoate-vilanterol (BREO ELLIPTA) 100-25 MCG/ACT 1 puff  1 puff Inhalation Daily [name], NP   1 puff at 09/18/22 0750   hydrOXYzine (ATARAX) tablet 25 mg  25 mg Oral TID PRN [name], NP       magnesium hydroxide (MILK OF MAGNESIA) suspension 30 mL  30 mL Oral Daily PRN [name], [name], NP       traZODone (DESYREL) tablet 50 mg  50 mg Oral QHS PRN [name], NP        Lab Results: No results found for this or any previous visit (from the past 48 hour(s)).  Blood Alcohol level:  Lab Results  Component Value Date   ETH <10 09/15/2022   ETH 309 (HH) 56/21/3086    Metabolic Disorder Labs: No results found for: "HGBA1C", "MPG" No results found for: "PROLACTIN" No results found for: "CHOL", "TRIG", "HDL", "CHOLHDL", "VLDL", "LDLCALC"  Physical Findings: AIMS:  , ,  ,  ,    CIWA:    COWS:     Musculoskeletal: Strength & Muscle Tone: within normal limits Gait & Station: normal Patient leans: N/A  Psychiatric Specialty Exam:  Presentation  General Appearance:  Appropriate for Environment  Eye Contact: Fair  Speech: Clear and Coherent  Speech Volume: Normal  Handedness: Right   Mood and Affect  Mood: Euthymic; Anxious  Affect: Full Range   Thought Process  Thought Processes: Coherent  Descriptions of Associations:Circumstantial  Orientation:Full (Time, Place and Person)  Thought Content:Delusions  History of  Schizophrenia/Schizoaffective disorder:Yes  Duration of Psychotic Symptoms:Greater than six months  Hallucinations:No data recorded Ideas of Reference:Paranoia; Delusions  Suicidal Thoughts:No data recorded Homicidal Thoughts:No data recorded  Sensorium  Memory: Immediate Fair; Remote Fair  Judgment: Impaired  Insight: Fair   Materials engineer: Fair  Attention Span: Fair  Recall: [employer]  of Knowledge: Fair  Language: Fair   Lexicographer Activity:No data recorded  Assets  Assets: Desire for Improvement; Financial Resources/Insurance; Housing; Social Support   Sleep  Sleep:No data recorded   Physical Exam: Physical Exam Vitals and nursing note reviewed.  Constitutional:      Appearance: Normal appearance. She is normal weight.  Neurological:     General: No focal deficit present.     Mental Status: She is alert and oriented to person, place, and time.  Psychiatric:        Attention and Perception: Attention normal. She perceives visual hallucinations.        Mood and Affect: Mood and affect normal.        Speech: Speech normal.        Behavior: Behavior normal. Behavior is cooperative.        Thought Content: Thought content normal.        Cognition and Memory: Cognition and memory normal.        Judgment: Judgment normal.    Review of Systems  Constitutional: Negative.   HENT: Negative.    Eyes: Negative.   Respiratory: Negative.    Cardiovascular: Negative.   Gastrointestinal: Negative.   Genitourinary: Negative.   Musculoskeletal: Negative.   Skin: Negative.   Neurological: Negative.   Endo/Heme/Allergies: Negative.   Psychiatric/Behavioral:  Positive for hallucinations.    Blood pressure 113/79, pulse (!) 101, temperature (!) 97.5 F (36.4 C), temperature source Oral, resp. rate 18, height 4\' 11"  (1.499 m), SpO2 98 %. Body mass index is 37 kg/m.   Treatment Plan Summary: Daily contact  with patient to assess and evaluate symptoms and progress in treatment, Medication management, and Plan continue current medications.  Jarquez Mestre , DO 09/18/2022, 1:02 PM

## 2022-09-18 NOTE — Progress Notes (Signed)
Patient is A+O x 4. She denies SI//HI. Denies AVH. "I have not heard voices or seen any critters since I started that Thorazine." During morning rounds, patient complained to nursing of anxiety 10/10. Medicated with 0.5 mg Xanax per PRN order. Upon follow up, anxiety lessened.  Appetite good. Medication compliant.   Order for 2L oxygen while sleeping in place. PRN Xanax given twice. New order for Protonix and Avapro.   Q15 minute unit checks observed.

## 2022-09-18 NOTE — Group Note (Signed)
[facility] LCSW Group Therapy Note   Group Date: 09/18/2022 Start Time: 1315 End Time: 1400  Type of Therapy/Topic:  Group Therapy:  Feelings about Diagnosis  Participation Level:  Did Not Attend   Description of Group:    This group will allow patients to explore their thoughts and feelings about diagnoses they have received. Patients will be guided to explore their level of understanding and acceptance of these diagnoses. Facilitator will encourage patients to process their thoughts and feelings about the reactions of others to their diagnosis, and will guide patients in identifying ways to discuss their diagnosis with significant others in their lives. This group will be process-oriented, with patients participating in exploration of their own experiences as well as giving and receiving support and challenge from other group members.   Therapeutic Goals: 1. Patient will demonstrate understanding of diagnosis as evidence by identifying two or more symptoms of the disorder:  2. Patient will be able to express two feelings regarding the diagnosis 3. Patient will demonstrate ability to communicate their needs through discussion and/or role plays  Summary of Patient Progress: Patient declined to attend group when encouraged by this clinician.  Therapeutic Modalities:   Cognitive Behavioral Therapy Brief Therapy Feelings Identification    Elisha Cooksey J Kelsa Jaworowski, LCSW 

## 2022-09-18 NOTE — Progress Notes (Addendum)
   09/18/22 1950  Psych Admission Type (Psych Patients Only)  Admission Status Voluntary  Psychosocial Assessment  Patient Complaints Anxiety  Facial Expression Anxious  Affect Appropriate to circumstance  Speech Logical/coherent  Interaction Assertive  Motor Activity Other (Comment) (wnl)  Appearance/Hygiene Unremarkable;In scrubs  Behavior Characteristics Cooperative  Mood Anxious;Pleasant  Thought Process  Coherency WDL  Content WDL  Delusions None reported or observed  Perception Hallucinations  Hallucination Auditory (hearing the sounds of raccoons moving about in her room)  Judgment WDL  Confusion None  Danger to Self  Current suicidal ideation? Denies  Danger to Others  Danger to Others None reported or observed   Progress note   D: Pt seen in dayroom. Pt denies SI, HI, VH. Pt reports AH of hearing raccoons scurrying about her room and making noises. "They make a high-pitched whistling sound that they use to call their babies."  Pt says that she has been on Seroquel for the AVH but wanted to wean off of it and try something else. "I finally got brave and came in to get some help. My sister said she didn't know how I could stand to have those hallucinations all this time. This doctor has none in one day what I was trying to accomplish all year. I feel a lot better than I did." Pt rates pain  10/10. Pt said that she fell off of her sister's couch onto the floor and hurt her tailbone. Pt c/o aching.  Pt rates anxiety  10/10 and depression  0/10. Says her anxiety is always high, especially the last few months that her AVH has been getting progressively worse. Pt has COPD and is on oxygen at 2 L/min at night. "The doctors say that I am having confusion because my oxygen level drops at night while I sleep. At home I am on 2.5 L at night." Pt does not use a walker but walks with care. Due to COPD, she is winded walking to her room. Pt is concerned about affording her new medication,  Thorazine. "It works for me but it is expensive. I have to see if I will be able to take it." No other concerns noted at this time.  A: Pt provided support and encouragement. Pt given scheduled medication as prescribed. PRNs as appropriate. Q15 min checks for safety.   R: Pt safe on the unit. Will continue to monitor.

## 2022-09-18 NOTE — Progress Notes (Signed)
Pt states that she takes Xanax  at home q6hours. Pt would like for her Xanax to be on this schedule instead of q8hours.

## 2022-09-19 DIAGNOSIS — F209 Schizophrenia, unspecified: Secondary | ICD-10-CM | POA: Diagnosis not present

## 2022-09-19 MED ORDER — CHLORPROMAZINE HCL 50 MG PO TABS
100.0000 mg | ORAL_TABLET | Freq: Every day | ORAL | Status: DC
  Administered 2022-09-19 – 2022-09-21 (×3): 100 mg via ORAL
  Filled 2022-09-19 (×3): qty 2

## 2022-09-19 MED ORDER — PANTOPRAZOLE SODIUM 40 MG PO TBEC
40.0000 mg | DELAYED_RELEASE_TABLET | Freq: Every day | ORAL | Status: DC
  Administered 2022-09-19 – 2022-09-24 (×6): 40 mg via ORAL
  Filled 2022-09-19 (×5): qty 1

## 2022-09-19 MED ORDER — ALPRAZOLAM 0.5 MG PO TABS
0.5000 mg | ORAL_TABLET | Freq: Every day | ORAL | Status: DC
  Administered 2022-09-19 – 2022-09-23 (×5): 0.5 mg via ORAL
  Filled 2022-09-19 (×5): qty 1

## 2022-09-19 MED ORDER — PANTOPRAZOLE SODIUM 40 MG PO TBEC: 40.0000 mg | DELAYED_RELEASE_TABLET | Freq: Every day | ORAL | Status: DC

## 2022-09-19 MED ORDER — CLONIDINE HCL 0.1 MG PO TABS
0.1000 mg | ORAL_TABLET | Freq: Three times a day (TID) | ORAL | Status: DC | PRN
  Administered 2022-09-19: 0.1 mg via ORAL
  Filled 2022-09-19: qty 1

## 2022-09-19 MED ORDER — IRBESARTAN 75 MG PO TABS
300.0000 mg | ORAL_TABLET | Freq: Every day | ORAL | Status: DC
  Administered 2022-09-20 – 2022-09-24 (×5): 300 mg via ORAL
  Filled 2022-09-19 (×5): qty 4

## 2022-09-19 MED ORDER — ALPRAZOLAM 0.5 MG PO TABS
0.5000 mg | ORAL_TABLET | Freq: Two times a day (BID) | ORAL | Status: DC | PRN
  Administered 2022-09-20: 0.5 mg via ORAL
  Filled 2022-09-19: qty 1

## 2022-09-19 NOTE — Progress Notes (Signed)
[facility] MD Progress Note  09/19/2022 1:33 PM Krystal Lee  MRN:  [id number] Subjective: Krystal Lee is seen on rounds.  She says that she did not sleep very well last night.  When she does not sleep that is when her visual hallucinations start and she describes it as seeing and hearing little animals in her bed.  She denies any suicidal ideation.  She says the Thorazine worked the first night and asked if we can go up on it.  I told her that would be logical.  She says that she also takes her Xanax 3 times a day which she takes 1 at bedtime.  Principal Problem: Schizophrenia (HCC) Diagnosis: Principal Problem:   Schizophrenia (HCC)  Total Time spent with patient: 15 minutes  Past Psychiatric History:  Quite extensive.  She has not seen a psychiatrist in 5 years and the last one was Dr. Omelia Lee in [place].  She has been psychiatrically hospitalized one time prior here at [facility].  She has been involved in outpatient with numerous psychiatrists in [place] including Dr. Adriana Lee for and Dr. Garnetta Lee. She went through [facility] in 2011.  Past Medical History:  Past Medical History:  Diagnosis Date   Anxiety    Arthritis    knees   Bipolar 1 disorder (HCC)    COPD (chronic obstructive pulmonary disease) (HCC)    Depression    GERD (gastroesophageal reflux disease)    Headache    during allergy season   Hepatitis C virus    Hypertension    Post-menopausal    Schizoaffective disorder (HCC)     Past Surgical History:  Procedure Laterality Date   CATARACT EXTRACTION  2010   right    CATARACT EXTRACTION W/PHACO Left 06/09/2019   Procedure: CATARACT EXTRACTION PHACO AND INTRAOCULAR LENS PLACEMENT (IOC) COMPLICATED  LEFT;  Surgeon: [name], MD;  Location: [facility];  Service: Ophthalmology;  Laterality: Left;  VISION BLUE  [place]   LOWER EXTREMITY ANGIOGRAPHY Bilateral 08/31/2013   [facility], Dr. [name]   Family History:  Family History  Problem Relation Age of Onset   Breast cancer  Paternal Grandmother    Bone cancer Paternal Grandmother    Pancreatic cancer Mother    Heart disease Father    Heart disease Paternal Uncle    Stroke Paternal Grandfather     Social History:  Social History   Substance and Sexual Activity  Alcohol Use Not Currently   Alcohol/week: 1.0 standard drink of alcohol   Types: 1 Cans of beer per week     Social History   Substance and Sexual Activity  Drug Use No    Social History   Socioeconomic History   Marital status: Widowed    Spouse name: Not on file   Number of children: Not on file   Years of education: Not on file   Highest education level: Not on file  Occupational History   Not on file  Tobacco Use   Smoking status: Former    Packs/day: 1.00    Years: 45.00    Total pack years: 45.00    Types: Cigarettes    Quit date: 05/01/2021    Years since quitting: 1.3   Smokeless tobacco: Never   Tobacco comments:    since age 89  Vaping Use   Vaping Use: Never used  Substance and Sexual Activity   Alcohol use: Not Currently    Alcohol/week: 1.0 standard drink of alcohol    Types: 1 Cans of beer per  week   Drug use: No   Sexual activity: Not on file  Other Topics Concern   Not on file  Social History Narrative   Not on file   Social Determinants of Health   Financial Resource Strain: Not on file  Food Insecurity: Not on file  Transportation Needs: Not on file  Physical Activity: Not on file  Stress: Not on file  Social Connections: Not on file   Additional Social History:                         Sleep: Poor  Appetite:  Good  Current Medications: Current Facility-Administered Medications  Medication Dose Route Frequency Provider Last Rate Last Admin   acetaminophen (TYLENOL) tablet 650 mg  650 mg Oral Q6H PRN [name], NP   650 mg at 09/18/22 2116   albuterol (PROVENTIL) (2.5 MG/3ML) 0.083% nebulizer solution 2.5 mg  2.5 mg Nebulization Q6H PRN [name], NP       albuterol  (VENTOLIN HFA) 108 (90 Base) MCG/ACT inhaler 2 puff  2 puff Inhalation Q4H [name], [name], NP   2 puff at 09/19/22 1214   ALPRAZolam (XANAX) tablet 0.5 mg  0.5 mg Oral BID PRN [name], DO       ALPRAZolam [name]) tablet 0.5 mg  0.5 mg Oral QHS [name], DO       alum & mag hydroxide-simeth (MAALOX/MYLANTA) 200-200-20 MG/5ML suspension 30 mL  30 mL Oral Q4H PRN [name], NP       buPROPion (WELLBUTRIN XL) 24 hr tablet 150 mg  150 mg Oral Daily [name], DO   150 mg at 09/19/22 7672   chlorproMAZINE (THORAZINE) tablet 100 mg  100 mg Oral QHS [name], DO       fluticasone furoate-vilanterol (BREO ELLIPTA) 100-25 MCG/ACT 1 puff  1 puff Inhalation Daily [name], NP   1 puff at 09/19/22 0806   hydrOXYzine (ATARAX) tablet 25 mg  25 mg Oral TID PRN [name], NP   25 mg at 09/18/22 2116   irbesartan (AVAPRO) tablet 75 mg  75 mg Oral Daily [name], DO   75 mg at 09/19/22 0956   magnesium hydroxide (MILK OF MAGNESIA) suspension 30 mL  30 mL Oral Daily PRN [name], NP       pantoprazole (PROTONIX) EC tablet 40 mg  40 mg Oral QPC breakfast [name], DO   40 mg at 09/19/22 1002   traZODone (DESYREL) tablet 50 mg  50 mg Oral QHS PRN [name], NP        Lab Results: No results found for this or any previous visit (from the past 48 hour(s)).  Blood Alcohol level:  Lab Results  Component Value Date   ETH <10 09/15/2022   ETH 309 (HH) 09/47/0962    Metabolic Disorder Labs: No results found for: "HGBA1C", "MPG" No results found for: "PROLACTIN" No results found for: "CHOL", "TRIG", "HDL", "CHOLHDL", "VLDL", "LDLCALC"  Physical Findings: AIMS:  , ,  ,  ,    CIWA:    COWS:     Musculoskeletal: Strength & Muscle Tone: within normal limits Gait & Station: normal Patient leans: N/A  Psychiatric Specialty Exam:  Presentation  General Appearance:  Appropriate for  Environment  Eye Contact: Fair  Speech: Clear and Coherent  Speech Volume: Normal  Handedness: Right   Mood and Affect  Mood: Euthymic; Anxious  Affect: Full Range   Thought Process  Thought Processes: Coherent  Descriptions of Associations:Circumstantial  Orientation:Full (Time, Place and Person)  Thought Content:Delusions  History of Schizophrenia/Schizoaffective disorder:Yes  Duration of Psychotic Symptoms:Greater than six months  Hallucinations:No data recorded Ideas of Reference:Paranoia; Delusions  Suicidal Thoughts:No data recorded Homicidal Thoughts:No data recorded  Sensorium  Memory: Immediate Fair; Remote Fair  Judgment: Impaired  Insight: Fair   Chartered certified accountant: Fair  Attention Span: Fair  Recall: [employer] of Knowledge: Fair  Language: Fair   Psychomotor Activity  Psychomotor Activity:No data recorded  Assets  Assets: Desire for Improvement; Financial Resources/Insurance; Housing; Social Support   Sleep  Sleep:No data recorded   Physical Exam: Physical Exam Vitals and nursing note reviewed.  Constitutional:      Appearance: Normal appearance. She is normal weight.  Neurological:     General: No focal deficit present.     Mental Status: She is alert and oriented to person, place, and time.  Psychiatric:        Attention and Perception: Attention normal. She perceives visual hallucinations.        Mood and Affect: Mood is depressed.        Speech: Speech normal.        Behavior: Behavior normal. Behavior is cooperative.        Thought Content: Thought content normal.        Cognition and Memory: Cognition and memory normal.        Judgment: Judgment normal.    Review of Systems  Constitutional: Negative.   HENT: Negative.    Eyes: Negative.   Respiratory: Negative.    Cardiovascular: Negative.   Gastrointestinal: Negative.   Genitourinary: Negative.   Musculoskeletal: Negative.    Skin: Negative.   Neurological: Negative.   Endo/Heme/Allergies: Negative.   Psychiatric/Behavioral: Negative.     Blood pressure 127/77, pulse (!) 106, temperature (!) 97.5 F (36.4 C), temperature source Oral, resp. rate 19, height 4\' 11"  (1.499 m), SpO2 98 %. Body mass index is 37 kg/m.   Treatment Plan Summary: Daily contact with patient to assess and evaluate symptoms and progress in treatment, Medication management, and Plan increase Thorazine to 100 mg at bedtime and change her Xanax to twice a day as needed and 1 at bedtime.  Melissia Lahman , DO 09/19/2022, 1:33 PM

## 2022-09-19 NOTE — Group Note (Signed)
[facility] LCSW Group Therapy Note   Group Date: 09/19/2022 Start Time: 9371 End Time: 1410   Type of Therapy/Topic:  Group Therapy:  Emotion Regulation  Participation Level:  Active   Mood:  Description of Group:    The purpose of this group is to assist patients in learning to regulate negative emotions and experience positive emotions. Patients will be guided to discuss ways in which they have been vulnerable to their negative emotions. These vulnerabilities will be juxtaposed with experiences of positive emotions or situations, and patients challenged to use positive emotions to combat negative ones. Special emphasis will be placed on coping with negative emotions in conflict situations, and patients will process healthy conflict resolution skills.  Therapeutic Goals: Patient will identify two positive emotions or experiences to reflect on in order to balance out negative emotions:  Patient will label two or more emotions that they find the most difficult to experience:  Patient will be able to demonstrate positive conflict resolution skills through discussion or role plays:   Summary of Patient Progress: Patient was present in group. Patient was an active participant. Patient was engaged and supportive of others.  Patient shared how she has struggled with feelings of disgust and being overwhelmed.     Therapeutic Modalities:   Cognitive Behavioral Therapy Feelings Identification Dialectical Behavioral Therapy   Rozann Lesches, LCSW

## 2022-09-19 NOTE — Progress Notes (Signed)
Patient is A+O x 4. She endorses AVH of seeing and hearing little animals. She denies SI/HI. Patient denies depression but endorses anxiety. Medicated with Xanax 0.5 mg PRN.  Patient's BP was 160/86 around dinner time. Clonidine ordered and adm. Upon reassessing, BP decreased to 123/73.  Patient has a new Xanax order daily at bedtime.  Q15 minute unit checks in place.

## 2022-09-19 NOTE — Plan of Care (Signed)
  Problem: Education: Goal: Knowledge of General Education information will improve Description: Including pain rating scale, medication(s)/side effects and non-pharmacologic comfort measures Outcome: Progressing   Problem: Health Behavior/Discharge Planning: Goal: Ability to manage health-related needs will improve Outcome: Progressing   Problem: Clinical Measurements: Goal: Respiratory complications will improve Outcome: Progressing Goal: Cardiovascular complication will be avoided Outcome: Progressing   Problem: Nutrition: Goal: Adequate nutrition will be maintained Outcome: Progressing   Problem: Coping: Goal: Level of anxiety will decrease Outcome: Progressing   

## 2022-09-20 DIAGNOSIS — F209 Schizophrenia, unspecified: Secondary | ICD-10-CM | POA: Diagnosis not present

## 2022-09-20 MED ORDER — ZONISAMIDE 100 MG PO CAPS
100.0000 mg | ORAL_CAPSULE | Freq: Every day | ORAL | Status: DC
  Administered 2022-09-20 – 2022-09-23 (×4): 100 mg via ORAL
  Filled 2022-09-20 (×4): qty 1

## 2022-09-20 MED ORDER — ALBUTEROL SULFATE HFA 108 (90 BASE) MCG/ACT IN AERS
2.0000 | INHALATION_SPRAY | Freq: Two times a day (BID) | RESPIRATORY_TRACT | Status: DC
  Administered 2022-09-20 – 2022-09-24 (×8): 2 via RESPIRATORY_TRACT

## 2022-09-20 MED ORDER — ALPRAZOLAM 0.5 MG PO TABS
0.5000 mg | ORAL_TABLET | Freq: Two times a day (BID) | ORAL | Status: DC
  Administered 2022-09-20 – 2022-09-24 (×8): 0.5 mg via ORAL
  Filled 2022-09-20 (×8): qty 1

## 2022-09-20 NOTE — Progress Notes (Signed)
Patient is alert and oriented times 4. Mood and affect appropriate. Patient rates pain as 10/10 to lower back. She admits to seeing rats and a huge dragonfly last night. Also denies feelings of anxiety and depression at this time. States she didn't sleep so  good last night. Morning meds given whole by mouth W/O difficulty. Ate breakfast in day room- appetite good. Patient remains on unit with Q15 minute checks in place.

## 2022-09-20 NOTE — Progress Notes (Addendum)
   09/19/22 2050  Psych Admission Type (Psych Patients Only)  Admission Status Voluntary  Psychosocial Assessment  Patient Complaints Anxiety;Other (Comment) (didn't sleep well last night)  Eye Contact Fair  Facial Expression Animated  Affect Appropriate to circumstance  Speech Logical/coherent  Interaction Assertive  Motor Activity Slow  Appearance/Hygiene In scrubs  Behavior Characteristics Cooperative;Anxious  Mood Anxious;Pleasant  Thought Process  Coherency WDL  Content WDL  Delusions None reported or observed  Perception Hallucinations  Hallucination Auditory (hearing animals (raccoons) in her room)  Judgment Impaired  Confusion None  Danger to Self  Current suicidal ideation? Denies  Danger to Others  Danger to Others None reported or observed   Progress note   D: Pt seen in dayroom interacting with peers. Pt denies SI, HI, VH. Pt endorses auditory hallucinations today of raccoons in her room. She can hear them make a chirping and scratching sound. 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 Pt thought she was fine and didn't go to a doctor at that time. Pt rates anxiety  10/10 and depression  0/10. Pt asked for her thorazine to be increased. Also wanted Xanax given at bedtime. Pt says she feels better today than before. Is hoping to tolerate the increase in thorazine to assist with the hallucinations. Pt wants to be proactive with her care and find the right medication mix for her. "I want to be able to clean my house and participate in life. Without my family, I would probably be in a home right now." Pt is interested in finding a therapist and also a back doctor to assist with fixing her lower back issues. Pt reports being less winded today when ambulating down the hallways. Still using 2 L O2 while sleeping. No other concerns noted at this time.  A: Pt provided support and encouragement. Pt given scheduled medication as prescribed.  PRNs as appropriate. Q15 min checks for safety.   R: Pt safe on the unit. Will continue to monitor.

## 2022-09-20 NOTE — Progress Notes (Signed)
[facility] MD Progress Note  09/20/2022 1:41 PM Krystal Lee  MRN:  [id number] Subjective: Krystal Lee is seen on rounds.  She is still having difficulty sleeping.  When she does not sleep she has visual hallucinations.  She says that the Thorazine has been helpful.  She does not want to go back on Seroquel because of weight gain.  She would like to try something that does not cause weight gain.  I told her that I have some ideas.  In the meantime, she would like her inhaler to be changed so that she does not get it in the evening before bed.  Principal Problem: Schizophrenia (HCC) Diagnosis: Principal Problem:   Schizophrenia (HCC)  Total Time spent with patient: 15 minutes  Past Psychiatric History: Quite extensive.  She has not seen a psychiatrist in 5 years and the last one was [name] in [place].  She has been psychiatrically hospitalized one time prior here at [facility].  She has been involved in outpatient with numerous psychiatrists in [place] including [name] for and [name]. She went through [facility] in 2011.   Past Medical History:  Past Medical History:  Diagnosis Date   Anxiety    Arthritis    knees   Bipolar 1 disorder (HCC)    COPD (chronic obstructive pulmonary disease) (HCC)    Depression    GERD (gastroesophageal reflux disease)    Headache    during allergy season   Hepatitis C virus    Hypertension    Post-menopausal    Schizoaffective disorder (HCC)     Past Surgical History:  Procedure Laterality Date   CATARACT EXTRACTION  2010   right    CATARACT EXTRACTION W/PHACO Left 06/09/2019   Procedure: CATARACT EXTRACTION PHACO AND INTRAOCULAR LENS PLACEMENT (IOC) COMPLICATED  LEFT;  Surgeon: [name], MD;  Location: [facility];  Service: Ophthalmology;  Laterality: Left;  VISION BLUE  [place]   LOWER EXTREMITY ANGIOGRAPHY Bilateral 08/31/2013   [facility], Dr. [name]   Family History:  Family History  Problem Relation Age of Onset   Breast cancer  Paternal Grandmother    Bone cancer Paternal Grandmother    Pancreatic cancer Mother    Heart disease Father    Heart disease Paternal Uncle    Stroke Paternal Grandfather     Social History:  Social History   Substance and Sexual Activity  Alcohol Use Not Currently   Alcohol/week: 1.0 standard drink of alcohol   Types: 1 Cans of beer per week     Social History   Substance and Sexual Activity  Drug Use No    Social History   Socioeconomic History   Marital status: Widowed    Spouse name: Not on file   Number of children: Not on file   Years of education: Not on file   Highest education level: Not on file  Occupational History   Not on file  Tobacco Use   Smoking status: Former    Packs/day: 1.00    Years: 45.00    Total pack years: 45.00    Types: Cigarettes    Quit date: 05/01/2021    Years since quitting: 1.3   Smokeless tobacco: Never   Tobacco comments:    since age 12  Vaping Use   Vaping Use: Never used  Substance and Sexual Activity   Alcohol use: Not Currently    Alcohol/week: 1.0 standard drink of alcohol    Types: 1 Cans of beer per week  Drug use: No   Sexual activity: Not on file  Other Topics Concern   Not on file  Social History Narrative   Not on file   Social Determinants of Health   Financial Resource Strain: Not on file  Food Insecurity: Not on file  Transportation Needs: Not on file  Physical Activity: Not on file  Stress: Not on file  Social Connections: Not on file   Additional Social History:                         Sleep: Poor  Appetite:  Good  Current Medications: Current Facility-Administered Medications  Medication Dose Route Frequency Provider Last Rate Last Admin   acetaminophen (TYLENOL) tablet 650 mg  650 mg Oral Q6H PRN Jearld Lesch, NP   650 mg at 09/20/22 0906   albuterol (PROVENTIL) (2.5 MG/3ML) 0.083% nebulizer solution 2.5 mg  2.5 mg Nebulization Q6H PRN Jearld Lesch, NP       albuterol  (VENTOLIN HFA) 108 (90 Base) MCG/ACT inhaler 2 puff  2 puff Inhalation Q4H [name], [name], NP   2 puff at 09/20/22 1141   ALPRAZolam (XANAX) tablet 0.5 mg  0.5 mg Oral BID PRN Sarina Ill, DO   0.5 mg at 09/20/22 7824   ALPRAZolam (XANAX) tablet 0.5 mg  0.5 mg Oral QHS Sarina Ill, DO   0.5 mg at 09/19/22 2124   alum & mag hydroxide-simeth (MAALOX/MYLANTA) 200-200-20 MG/5ML suspension 30 mL  30 mL Oral Q4H PRN Jearld Lesch, NP       buPROPion (WELLBUTRIN XL) 24 hr tablet 150 mg  150 mg Oral Daily Sarina Ill, DO   150 mg at 09/20/22 2353   chlorproMAZINE (THORAZINE) tablet 100 mg  100 mg Oral QHS Sarina Ill, DO   100 mg at 09/19/22 2124   cloNIDine (CATAPRES) tablet 0.1 mg  0.1 mg Oral TID PRN Sarina Ill, DO   0.1 mg at 09/19/22 1634   fluticasone furoate-vilanterol (BREO ELLIPTA) 100-25 MCG/ACT 1 puff  1 puff Inhalation Daily Jearld Lesch, NP   1 puff at 09/20/22 0856   hydrOXYzine (ATARAX) tablet 25 mg  25 mg Oral TID PRN Jearld Lesch, NP   25 mg at 09/18/22 2116   irbesartan (AVAPRO) tablet 300 mg  300 mg Oral Daily Sarina Ill, DO   300 mg at 09/20/22 6144   magnesium hydroxide (MILK OF MAGNESIA) suspension 30 mL  30 mL Oral Daily PRN Jearld Lesch, NP       pantoprazole (PROTONIX) EC tablet 40 mg  40 mg Oral QPC breakfast Sarina Ill, DO   40 mg at 09/20/22 3154   traZODone (DESYREL) tablet 50 mg  50 mg Oral QHS PRN Jearld Lesch, NP        Lab Results: No results found for this or any previous visit (from the past 48 hour(s)).  Blood Alcohol level:  Lab Results  Component Value Date   ETH <10 09/15/2022   ETH 309 (HH) 08/27/2016    Metabolic Disorder Labs: No results found for: "HGBA1C", "MPG" No results found for: "PROLACTIN" No results found for: "CHOL", "TRIG", "HDL", "CHOLHDL", "VLDL", "LDLCALC"  Physical Findings: AIMS:  , ,  ,  ,    CIWA:    COWS:      Musculoskeletal: Strength & Muscle Tone: within normal limits Gait & Station: normal Patient leans: N/A  Psychiatric Specialty Exam:  Presentation  General Appearance:  Appropriate for Environment  Eye Contact: Fair  Speech: Clear and Coherent  Speech Volume: Normal  Handedness: Right   Mood and Affect  Mood: Euthymic; Anxious  Affect: Full Range   Thought Process  Thought Processes: Coherent  Descriptions of Associations:Circumstantial  Orientation:Full (Time, Place and Person)  Thought Content:Delusions  History of Schizophrenia/Schizoaffective disorder:Yes  Duration of Psychotic Symptoms:Greater than six months  Hallucinations:No data recorded Ideas of Reference:Paranoia; Delusions  Suicidal Thoughts:No data recorded Homicidal Thoughts:No data recorded  Sensorium  Memory: Immediate Fair; Remote Fair  Judgment: Impaired  Insight: Fair   Materials engineer: Fair  Attention Span: Fair  Recall: [employer] of Knowledge: Fair  Language: Fair   Psychomotor Activity  Psychomotor Activity:No data recorded  Assets  Assets: Desire for Improvement; Financial Resources/Insurance; Housing; Social Support   Sleep  Sleep:No data recorded   Physical Exam: Physical Exam Vitals and nursing note reviewed.  Constitutional:      Appearance: Normal appearance. She is normal weight.  Neurological:     General: No focal deficit present.     Mental Status: She is alert and oriented to person, place, and time.  Psychiatric:        Attention and Perception: Attention normal. She perceives visual hallucinations.        Mood and Affect: Mood and affect normal.        Speech: Speech normal.        Behavior: Behavior normal. Behavior is cooperative.        Thought Content: Thought content normal.        Cognition and Memory: Cognition and memory normal.        Judgment: Judgment normal.    Review of Systems   Psychiatric/Behavioral:  Positive for hallucinations. The patient has insomnia.    Blood pressure 130/81, pulse 85, temperature 97.9 F (36.6 C), resp. rate 18, height 4\' 11"  (1.499 m), SpO2 99 %. Body mass index is 37 kg/m.   Treatment Plan Summary: Daily contact with patient to assess and evaluate symptoms and progress in treatment, Medication management, and Plan start Zonegran 100 mg at bedtime for mood, sleep, and possible weight loss.  Change Xanax to 10, 4, at bedtime.  Change albuterol to twice a day before 5.  [name], DO 09/20/2022, 1:41 PM

## 2022-09-20 NOTE — Plan of Care (Signed)

## 2022-09-20 NOTE — Progress Notes (Addendum)
   09/20/22 2120  Psych Admission Type (Psych Patients Only)  Admission Status Voluntary  Psychosocial Assessment  Patient Complaints Anxiety  Eye Contact Fair  Facial Expression Anxious  Affect Appropriate to circumstance  Speech Logical/coherent  Interaction Assertive  Motor Activity Slow  Appearance/Hygiene In scrubs  Behavior Characteristics Cooperative;Appropriate to situation;Anxious  Mood Anxious;Pleasant  Thought Process  Coherency WDL  Content WDL  Delusions None reported or observed  Perception Hallucinations  Hallucination Visual (saw a horsefly and a mouse running around the room; denies AH today)  Judgment Impaired  Confusion None  Danger to Self  Current suicidal ideation? Denies  Danger to Others  Danger to Others None reported or observed   Progress note   D: Pt seen in dayroom interacting with peers. Pt denies SI, HI, AH. Pt endorses VH of seeing a dragonfly and a mouse running around her room. Denies hearing the whistling noise today. Pt rates pain  8/10 as a headache. Pt rates anxiety  6/10 and depression  0/10. Pt states that she likes the new medication and does not want to be prescribed Seroquel anymore. Pt agreeable to try a new medication because she is concerned about more weight gain. Pt still feels that she is doing better than she was before admission. Pt agrees to communicate any side effects to the provider. No other concerns noted at this time.  A: Pt provided support and encouragement. Pt given scheduled medication as prescribed. PRNs as appropriate. Q15 min checks for safety.   R: Pt safe on the unit. Will continue to monitor.

## 2022-09-21 DIAGNOSIS — F209 Schizophrenia, unspecified: Secondary | ICD-10-CM | POA: Diagnosis not present

## 2022-09-21 NOTE — Progress Notes (Signed)
[facility] MD Progress Note  09/21/2022 2:02 PM Krystal Lee  MRN:  [id number] Subjective: Krystal Lee is seen on rounds Krystal Lee informs me that Krystal Lee had the best sleep in a long time last night.  I started her on Zonegran 100 mg at bedtime along with the Thorazine and Krystal Lee says that the combination seems to have worked.  I also informed her that it can help with weight loss and Krystal Lee is very happy about that.  Krystal Lee has been on Seroquel in the past and gained weight from it.  No complaints of visual hallucinations last night.  Principal Problem: Schizophrenia (HCC) Diagnosis: Principal Problem:   Schizophrenia (HCC)  Total Time spent with patient: 15 minutes  Past Psychiatric History: Quite extensive.  Krystal Lee has not seen a psychiatrist in 5 years and the last one was [name] in [place].  Krystal Lee has been psychiatrically hospitalized one time prior here at [facility].  Krystal Lee has been involved in outpatient with numerous psychiatrists in [place] including [name] for and [name]. Krystal Lee went through [facility] in 2011.    Past Medical History:  Past Medical History:  Diagnosis Date   Anxiety    Arthritis    knees   Bipolar 1 disorder (HCC)    COPD (chronic obstructive pulmonary disease) (HCC)    Depression    GERD (gastroesophageal reflux disease)    Headache    during allergy season   Hepatitis C virus    Hypertension    Post-menopausal    Schizoaffective disorder (HCC)     Past Surgical History:  Procedure Laterality Date   CATARACT EXTRACTION  2010   right    CATARACT EXTRACTION W/PHACO Left 06/09/2019   Procedure: CATARACT EXTRACTION PHACO AND INTRAOCULAR LENS PLACEMENT (IOC) COMPLICATED  LEFT;  Surgeon: [name], MD;  Location: [facility];  Service: Ophthalmology;  Laterality: Left;  VISION BLUE  OM[place]A   LOWER EXTREMITY ANGIOGRAPHY Bilateral 08/31/2013   [facility], Dr. [name]   Family History:  Family History  Problem Relation Age of Onset   Breast cancer Paternal Grandmother    Bone  cancer Paternal Grandmother    Pancreatic cancer Mother    Heart disease Father    Heart disease Paternal Uncle    Stroke Paternal Grandfather    Family Psychiatric  History: Unremarkable Social History:  Social History   Substance and Sexual Activity  Alcohol Use Not Currently   Alcohol/week: 1.0 standard drink of alcohol   Types: 1 Cans of beer per week     Social History   Substance and Sexual Activity  Drug Use No    Social History   Socioeconomic History   Marital status: Widowed    Spouse name: Not on file   Number of children: Not on file   Years of education: Not on file   Highest education level: Not on file  Occupational History   Not on file  Tobacco Use   Smoking status: Former    Packs/day: 1.00    Years: 45.00    Total pack years: 45.00    Types: Cigarettes    Quit date: 05/01/2021    Years since quitting: 1.3   Smokeless tobacco: Never   Tobacco comments:    since age 89  Vaping Use   Vaping Use: Never used  Substance and Sexual Activity   Alcohol use: Not Currently    Alcohol/week: 1.0 standard drink of alcohol    Types: 1 Cans of beer per week   Drug  use: No   Sexual activity: Not on file  Other Topics Concern   Not on file  Social History Narrative   Not on file   Social Determinants of Health   Financial Resource Strain: Not on file  Food Insecurity: Not on file  Transportation Needs: Not on file  Physical Activity: Not on file  Stress: Not on file  Social Connections: Not on file   Additional Social History:                         Sleep: Fair  Appetite:  Good  Current Medications: Current Facility-Administered Medications  Medication Dose Route Frequency Provider Last Rate Last Admin   acetaminophen (TYLENOL) tablet 650 mg  650 mg Oral Q6H PRN [name], NP   650 mg at 09/21/22 0903   albuterol (PROVENTIL) (2.5 MG/3ML) 0.083% nebulizer solution 2.5 mg  2.5 mg Nebulization Q6H PRN [name], NP        albuterol (VENTOLIN HFA) 108 (90 Base) MCG/ACT inhaler 2 puff  2 puff Inhalation BID [name], DO   2 puff at 09/21/22 0849   ALPRAZolam [name]) tablet 0.5 mg  0.5 mg Oral QHS [name], DO   0.5 mg at 09/20/22 2119   ALPRAZolam (XANAX) tablet 0.5 mg  0.5 mg Oral BID [name], DO   0.5 mg at 09/21/22 0850   alum & mag hydroxide-simeth (MAALOX/MYLANTA) 200-200-20 MG/5ML suspension 30 mL  30 mL Oral Q4H PRN [name], NP       buPROPion (WELLBUTRIN XL) 24 hr tablet 150 mg  150 mg Oral Daily [name], DO   150 mg at 09/21/22 0850   chlorproMAZINE (THORAZINE) tablet 100 mg  100 mg Oral QHS [name], DO   100 mg at 09/20/22 2120   cloNIDine (CATAPRES) tablet 0.1 mg  0.1 mg Oral TID PRN [name], DO   0.1 mg at 09/19/22 1634   fluticasone furoate-vilanterol (BREO ELLIPTA) 100-25 MCG/ACT 1 puff  1 puff Inhalation Daily [name], NP   1 puff at 09/21/22 0803   hydrOXYzine (ATARAX) tablet 25 mg  25 mg Oral TID PRN [name], NP   25 mg at 09/18/22 2116   irbesartan (AVAPRO) tablet 300 mg  300 mg Oral Daily [name], DO   300 mg at 09/21/22 0849   magnesium hydroxide (MILK OF MAGNESIA) suspension 30 mL  30 mL Oral Daily PRN [name], NP       pantoprazole (PROTONIX) EC tablet 40 mg  40 mg Oral QPC breakfast [name], DO   40 mg at 09/21/22 0850   traZODone (DESYREL) tablet 50 mg  50 mg Oral QHS PRN [name], NP       zonisamide (ZONEGRAN) capsule 100 mg  100 mg Oral QHS [name], DO   100 mg at 09/20/22 2120    Lab Results: No results found for this or any previous visit (from the past 48 hour(s)).  Blood Alcohol level:  Lab Results  Component Value Date   ETH <10 09/15/2022   ETH 309 (HH) 57/32/2025    Metabolic Disorder Labs: No results found for: "HGBA1C", "MPG" No results found for: "PROLACTIN" No results found for: "CHOL",  "TRIG", "HDL", "CHOLHDL", "VLDL", "LDLCALC"  Physical Findings: AIMS:  , ,  ,  ,    CIWA:    COWS:  Musculoskeletal: Strength & Muscle Tone: within normal limits Gait & Station: normal Patient leans: N/A  Psychiatric Specialty Exam:  Presentation  General Appearance:  Appropriate for Environment  Eye Contact: Fair  Speech: Clear and Coherent  Speech Volume: Normal  Handedness: Right   Mood and Affect  Mood: Euthymic; Anxious  Affect: Full Range   Thought Process  Thought Processes: Coherent  Descriptions of Associations:Circumstantial  Orientation:Full (Time, Place and Person)  Thought Content:Delusions  History of Schizophrenia/Schizoaffective disorder:Yes  Duration of Psychotic Symptoms:Greater than six months  Hallucinations:No data recorded Ideas of Reference:Paranoia; Delusions  Suicidal Thoughts:No data recorded Homicidal Thoughts:No data recorded  Sensorium  Memory: Immediate Fair; Remote Fair  Judgment: Impaired  Insight: Fair   Chartered certified accountant: Fair  Attention Span: Fair  Recall: [employer] of Knowledge: Fair  Language: Fair   Psychomotor Activity  Psychomotor Activity:No data recorded  Assets  Assets: Desire for Improvement; Financial Resources/Insurance; Housing; Social Support   Sleep  Sleep:No data recorded   Physical Exam: Physical Exam Vitals and nursing note reviewed.  Constitutional:      Appearance: Normal appearance. Krystal Lee is normal weight.  Neurological:     General: No focal deficit present.     Mental Status: Krystal Lee is alert and oriented to person, place, and time.  Psychiatric:        Attention and Perception: Attention and perception normal.        Mood and Affect: Mood and affect normal.        Speech: Speech normal.        Behavior: Behavior normal. Behavior is cooperative.        Thought Content: Thought content normal.        Cognition and Memory: Cognition and  memory normal.        Judgment: Judgment normal.    Review of Systems  Constitutional: Negative.   HENT: Negative.    Eyes: Negative.   Respiratory: Negative.    Cardiovascular: Negative.   Gastrointestinal: Negative.   Genitourinary: Negative.   Musculoskeletal: Negative.   Skin: Negative.   Neurological: Negative.   Endo/Heme/Allergies: Negative.   Psychiatric/Behavioral: Negative.     Blood pressure 108/73, pulse 94, temperature 98.4 F (36.9 C), temperature source Oral, resp. rate 18, height 4\' 11"  (1.499 m), SpO2 99 %. Body mass index is 37 kg/m.   Treatment Plan Summary: Daily contact with patient to assess and evaluate symptoms and progress in treatment, Medication management, and Plan continue current medications.  Krystal Lee has room to move up on the Zonegran and towards the Thorazine.  , DO 09/21/2022, 2:02 PM

## 2022-09-21 NOTE — Plan of Care (Signed)
  Problem: Health Behavior/Discharge Planning: Goal: Ability to manage health-related needs will improve Outcome: Progressing   Problem: Clinical Measurements: Goal: Respiratory complications will improve Outcome: Progressing   Problem: Nutrition: Goal: Adequate nutrition will be maintained Outcome: Progressing   Problem: Coping: Goal: Level of anxiety will decrease Outcome: Progressing   Problem: Safety: Goal: Ability to remain free from injury will improve Outcome: Progressing   Problem: Skin Integrity: Goal: Risk for impaired skin integrity will decrease Outcome: Progressing

## 2022-09-21 NOTE — Progress Notes (Signed)
PRN given for constipation.  Patient reports last BM was approximately 3 days ago.

## 2022-09-21 NOTE — Progress Notes (Signed)
Patient is alert and oriented x 4.  Appropriate affect.  Pleasant upon interaction.  Patient denies SI/HI, AH and depression.  Patient states she has not had any VH today.  Endorses anxiety.  Pain rated 7/10 (headache) this morning. Compliant with scheduled medications.   15 min checks in place.   Patient is safe on the unit.  Patient is present in the milieu  Appropriate interaction with peers.   PRN medication give for 9/10 back pain.

## 2022-09-21 NOTE — Progress Notes (Signed)
Patient is alert and oriented times 4. Mood and affect appropriate. Patient rates pain as 0/10. She denies SI, HI, and AVH. Patient also denies any feelings of anxiety and depression at this time. Patient states she slept well last night. Evening medicines administered whole by mouth without difficulty. Patient ate snack in day room; appetite was fair. Patient remains on unit with Q15 minute checks in place.

## 2022-09-21 NOTE — Group Note (Signed)
[facility] LCSW Group Therapy Note   Group Date: 09/21/2022 Start Time: 1300 End Time: 1400  Type of Therapy and Topic:  Group Therapy:  Feelings around Relapse and Recovery  Participation Level:  Did Not Attend   Mood:  Description of Group:    Patients in this group will discuss emotions they experience before and after a relapse. They will process how experiencing these feelings, or avoidance of experiencing them, relates to having a relapse. Facilitator will guide patients to explore emotions they have related to recovery. Patients will be encouraged to process which emotions are more powerful. They will be guided to discuss the emotional reaction significant others in their lives may have to patients' relapse or recovery. Patients will be assisted in exploring ways to respond to the emotions of others without this contributing to a relapse.  Therapeutic Goals: Patient will identify two or more emotions that lead to relapse for them:  Patient will identify two emotions that result when they relapse:  Patient will identify two emotions related to recovery:  Patient will demonstrate ability to communicate their needs through discussion and/or role plays.   Summary of Patient Progress:  Group not held due to complex discharge planning.  CSW provided worksheets for patient.   Therapeutic Modalities:   Cognitive Behavioral Therapy Solution-Focused Therapy Assertiveness Training Relapse Prevention Therapy   Noelie Renfrow J Denora Wysocki, LCSW 

## 2022-09-22 DIAGNOSIS — F203 Undifferentiated schizophrenia: Secondary | ICD-10-CM

## 2022-09-22 MED ORDER — CHLORPROMAZINE HCL 50 MG PO TABS
150.0000 mg | ORAL_TABLET | Freq: Every day | ORAL | Status: DC
  Administered 2022-09-22 – 2022-09-23 (×2): 150 mg via ORAL
  Filled 2022-09-22 (×2): qty 3

## 2022-09-22 NOTE — BH IP Treatment Plan (Signed)
Interdisciplinary Treatment and Diagnostic Plan Update  09/22/2022 Time of Session: 10:00am Krystal Lee MRN: [id number]  Principal Diagnosis: Schizophrenia [facility])  Secondary Diagnoses: Principal Problem:   Schizophrenia (HCC)   Current Medications:  Current Facility-Administered Medications  Medication Dose Route Frequency Provider Last Rate Last Admin   acetaminophen (TYLENOL) tablet 650 mg  650 mg Oral Q6H PRN Jearld Lesch, NP   650 mg at 09/22/22 0827   albuterol (PROVENTIL) (2.5 MG/3ML) 0.083% nebulizer solution 2.5 mg  2.5 mg Nebulization Q6H PRN Jearld Lesch, NP       albuterol (VENTOLIN HFA) 108 (90 Base) MCG/ACT inhaler 2 puff  2 puff Inhalation BID Sarina Ill, DO   2 puff at 09/21/22 1522   ALPRAZolam (XANAX) tablet 0.5 mg  0.5 mg Oral QHS Sarina Ill, DO   0.5 mg at 09/21/22 2114   ALPRAZolam (XANAX) tablet 0.5 mg  0.5 mg Oral BID Sarina Ill, DO   0.5 mg at 09/22/22 0823   alum & mag hydroxide-simeth (MAALOX/MYLANTA) 200-200-20 MG/5ML suspension 30 mL  30 mL Oral Q4H PRN Jearld Lesch, NP       buPROPion (WELLBUTRIN XL) 24 hr tablet 150 mg  150 mg Oral Daily Sarina Ill, DO   150 mg at 09/22/22 0827   chlorproMAZINE (THORAZINE) tablet 100 mg  100 mg Oral QHS Sarina Ill, DO   100 mg at 09/21/22 2114   cloNIDine (CATAPRES) tablet 0.1 mg  0.1 mg Oral TID PRN Sarina Ill, DO   0.1 mg at 09/19/22 1634   fluticasone furoate-vilanterol (BREO ELLIPTA) 100-25 MCG/ACT 1 puff  1 puff Inhalation Daily Jearld Lesch, NP   1 puff at 09/22/22 0300   hydrOXYzine (ATARAX) tablet 25 mg  25 mg Oral TID PRN Jearld Lesch, NP   25 mg at 09/18/22 2116   irbesartan (AVAPRO) tablet 300 mg  300 mg Oral Daily Sarina Ill, DO   300 mg at 09/22/22 0827   magnesium hydroxide (MILK OF MAGNESIA) suspension 30 mL  30 mL Oral Daily PRN Jearld Lesch, NP   30 mL at 09/21/22 1521   pantoprazole  (PROTONIX) EC tablet 40 mg  40 mg Oral QPC breakfast Sarina Ill, DO   40 mg at 09/22/22 0827   traZODone (DESYREL) tablet 50 mg  50 mg Oral QHS PRN Jearld Lesch, NP       zonisamide (ZONEGRAN) capsule 100 mg  100 mg Oral QHS Sarina Ill, DO   100 mg at 09/21/22 2114   PTA Medications: Medications Prior to Admission  Medication Sig Dispense Refill Last Dose   albuterol (PROVENTIL) (2.5 MG/3ML) 0.083% nebulizer solution Take 2.5 mg by nebulization every 6 (six) hours as needed for wheezing or shortness of breath.      albuterol (VENTOLIN HFA) 108 (90 Base) MCG/ACT inhaler Inhale 2 puffs into the lungs every 4 (four) hours as needed for shortness of breath or wheezing.      ALPRAZolam (XANAX) 0.5 MG tablet Take 0.5 mg by mouth 3 (three) times daily as needed for anxiety.      cholecalciferol (VITAMIN D3) 25 MCG (1000 UNIT) tablet Take 1,000 Units by mouth daily.      Fluticasone-Umeclidin-Vilant 100-62.5-25 MCG/ACT AEPB Inhale 1 puff into the lungs daily.      furosemide (LASIX) 20 MG tablet Take 1 tablet (20 mg total) by mouth daily. 30 tablet     montelukast (SINGULAIR) 10 MG  tablet Take 10 mg by mouth at bedtime.      QUEtiapine (SEROQUEL) 100 MG tablet Take 100 mg by mouth at bedtime.      roflumilast (DALIRESP) 500 MCG TABS tablet Take 500 mcg by mouth daily.      rosuvastatin (CRESTOR) 40 MG tablet Take 40 mg by mouth at bedtime.      valsartan (DIOVAN) 80 MG tablet Take 1 tablet (80 mg total) by mouth daily. 30 tablet 0     Patient Stressors:    Patient Strengths:    Treatment Modalities: Medication Management, Group therapy, Case management,  1 to 1 session with clinician, Psychoeducation, Recreational therapy.   Physician Treatment Plan for Primary Diagnosis: Schizophrenia ([place]) Long Term Goal(s): Improvement in symptoms so as ready for discharge   Short Term Goals: Ability to identify changes in lifestyle to reduce recurrence of condition will  improve Ability to verbalize feelings will improve Ability to disclose and discuss suicidal ideas Ability to demonstrate self-control will improve Ability to identify and develop effective coping behaviors will improve Ability to maintain clinical measurements within normal limits will improve Compliance with prescribed medications will improve  Medication Management: Evaluate patient's response, side effects, and tolerance of medication regimen.  Therapeutic Interventions: 1 to 1 sessions, Unit Group sessions and Medication administration.  Evaluation of Outcomes: Progressing  Physician Treatment Plan for Secondary Diagnosis: Principal Problem:   Schizophrenia ([place])  Long Term Goal(s): Improvement in symptoms so as ready for discharge   Short Term Goals: Ability to identify changes in lifestyle to reduce recurrence of condition will improve Ability to verbalize feelings will improve Ability to disclose and discuss suicidal ideas Ability to demonstrate self-control will improve Ability to identify and develop effective coping behaviors will improve Ability to maintain clinical measurements within normal limits will improve Compliance with prescribed medications will improve     Medication Management: Evaluate patient's response, side effects, and tolerance of medication regimen.  Therapeutic Interventions: 1 to 1 sessions, Unit Group sessions and Medication administration.  Evaluation of Outcomes: Progressing   RN Treatment Plan for Primary Diagnosis: Schizophrenia ([place]) Long Term Goal(s): Knowledge of disease and therapeutic regimen to maintain health will improve  Short Term Goals: Ability to remain free from injury will improve, Ability to demonstrate self-control, Ability to participate in decision making will improve, Ability to identify and develop effective coping behaviors will improve, and Compliance with prescribed medications will improve  Medication Management: RN  will administer medications as ordered by provider, will assess and evaluate patient's response and provide education to patient for prescribed medication. RN will report any adverse and/or side effects to prescribing provider.  Therapeutic Interventions: 1 on 1 counseling sessions, Psychoeducation, Medication administration, Evaluate responses to treatment, Monitor vital signs and CBGs as ordered, Perform/monitor CIWA, COWS, AIMS and Fall Risk screenings as ordered, Perform wound care treatments as ordered.  Evaluation of Outcomes: Progressing   LCSW Treatment Plan for Primary Diagnosis: Schizophrenia ([place]) Long Term Goal(s): Safe transition to appropriate next level of care at discharge, Engage patient in therapeutic group addressing interpersonal concerns.  Short Term Goals: Engage patient in aftercare planning with referrals and resources, Increase social support, Increase emotional regulation, Facilitate acceptance of mental health diagnosis and concerns, and Increase skills for wellness and recovery  Therapeutic Interventions: Assess for all discharge needs, 1 to 1 time with Social worker, Explore available resources and support systems, Assess for adequacy in community support network, Educate family and significant other(s) on suicide prevention, Complete Psychosocial Assessment,  Interpersonal group therapy.  Evaluation of Outcomes: Progressing   Progress in Treatment: Attending groups: Yes and No. Participating in groups: Yes and No. Taking medication as prescribed: Yes. Toleration medication: Yes. Family/Significant other contact made: No, will contact:  Pt declined consents Patient understands diagnosis: Yes. Discussing patient identified problems/goals with staff: Yes. Medical problems stabilized or resolved: Yes. Denies suicidal/homicidal ideation: Yes. Issues/concerns per patient self-inventory: No. Other: none   New problem(s) identified: No, Describe:  none Update  09/22/2022: No changes at this time.   New Short Term/Long Term Goal(s): elimination of symptoms of psychosis, medication management for mood stabilization; elimination of SI thoughts; development of comprehensive mental wellness/sobriety plan. Update 09/22/2022: No changes at this time.   Patient Goals:  "make the hallucinations stop" Update 09/22/2022: No changes at this time.   Discharge Plan or Barriers: CSW to assist with the development of appropriate discharge plans. Update 09/22/2022: Pt is improving and receptive to outpatient follow up. Will continue to work with pt on appropriate discharge planning.    Reason for Continuation of Hospitalization: Anxiety Depression Hallucinations Medical Issues Medication stabilization Suicidal ideation   Estimated Length of Stay:  1-7 days Update 09/22/2022: No changes at this time.  Last 3 [place] Suicide Severity Risk Score: Flowsheet Row Admission (Current) from 09/16/2022 in [facility] ED from 09/15/2022 in [facility] DEPARTMENT ED to Hosp-Admission (Discharged) from 05/18/2021 in [facility] 1C MEDICAL TELEMETRY  C-SSRS RISK CATEGORY No Risk No Risk No Risk       Last PHQ 2/9 Scores:     No data to display          Scribe for Treatment Team: Otelia Santee, LCSW 09/22/2022 10:12 AM

## 2022-09-22 NOTE — Progress Notes (Signed)
Patient alert and oriented x 4.  Patient with appropriate affect.  Patient reports it took a long time for her to fall asleep last night and would like her medication increased.    Complaints of racing thoughts.  Denies SI/HI, AVH and depression. Endorses anxiety.  Compliant with scheduled medications.  15 min checks in place.  Patient is safe on the unit.  Patient present in milieu. Appropriate interaction with peers.

## 2022-09-22 NOTE — Progress Notes (Signed)
Patient is alert and oriented times 4. Mood and affect appropriate. Patient rates pain as 4/10. She denies SI, HI, and AVH. Patient denies feelings of anxiety and depression at this time. Patient states she slept well last night. Evening medicines administered whole by mouth without difficulty. Patient ate snack in day room; appetite was fair. Patient remains on unit with Q15 minute checks in place.

## 2022-09-22 NOTE — Plan of Care (Signed)
  Problem: Health Behavior/Discharge Planning: Goal: Ability to manage health-related needs will improve Outcome: Progressing   Problem: Clinical Measurements: Goal: Will remain free from infection Outcome: Progressing   Problem: Activity: Goal: Risk for activity intolerance will decrease Outcome: Progressing   Problem: Nutrition: Goal: Adequate nutrition will be maintained Outcome: Progressing   Problem: Pain Managment: Goal: General experience of comfort will improve Outcome: Progressing   Problem: Safety: Goal: Ability to remain free from injury will improve Outcome: Progressing   Problem: Skin Integrity: Goal: Risk for impaired skin integrity will decrease Outcome: Progressing   

## 2022-09-22 NOTE — Progress Notes (Signed)
[facility] MD Progress Note  09/22/2022 3:28 PM Krystal Lee  MRN:  [id number] Subjective: Patient seen and chart reviewed.  66 year old woman with a history of schizophrenia.  Patient reports that she is feeling "much better".  She slept better last night although she still had trouble falling asleep taking about 2 hours before she could get to sleep.  She still has occasional hallucinations but does no longer have tactile or visual hallucinations only "chirping" which she says does not bother her very much.  Mood improved. Principal Problem: Schizophrenia ([place]) Diagnosis: Principal Problem:   Schizophrenia ([place])  Total Time spent with patient: 30 minutes  Past Psychiatric History: Past history of a diagnosis of schizophrenia.  Past Medical History:  Past Medical History:  Diagnosis Date   Anxiety    Arthritis    knees   Bipolar 1 disorder (HCC)    COPD (chronic obstructive pulmonary disease) (HCC)    Depression    GERD (gastroesophageal reflux disease)    Headache    during allergy season   Hepatitis C virus    Hypertension    Post-menopausal    Schizoaffective disorder ([place])     Past Surgical History:  Procedure Laterality Date   CATARACT EXTRACTION  2010   right    CATARACT EXTRACTION W/PHACO Left 06/09/2019   Procedure: CATARACT EXTRACTION PHACO AND INTRAOCULAR LENS PLACEMENT ([place]) COMPLICATED  LEFT;  Surgeon: Birder Robson, MD;  Location: [place];  Service: Ophthalmology;  Laterality: Left;  VISION BLUE  [place]   LOWER EXTREMITY ANGIOGRAPHY Bilateral 08/31/2013   [facility], Dr. Lucky Cowboy   Family History:  Family History  Problem Relation Age of Onset   Breast cancer Paternal Grandmother    Bone cancer Paternal Grandmother    Pancreatic cancer Mother    Heart disease Father    Heart disease Paternal Uncle    Stroke Paternal Grandfather    Family Psychiatric  History: See previous Social History:  Social History   Substance and Sexual Activity  Alcohol  Use Not Currently   Alcohol/week: 1.0 standard drink of alcohol   Types: 1 Cans of beer per week     Social History   Substance and Sexual Activity  Drug Use No    Social History   Socioeconomic History   Marital status: Widowed    Spouse name: Not on file   Number of children: Not on file   Years of education: Not on file   Highest education level: Not on file  Occupational History   Not on file  Tobacco Use   Smoking status: Former    Packs/day: 1.00    Years: 45.00    Total pack years: 45.00    Types: Cigarettes    Quit date: 05/01/2021    Years since quitting: 1.3   Smokeless tobacco: Never   Tobacco comments:    since age 46  Vaping Use   Vaping Use: Never used  Substance and Sexual Activity   Alcohol use: Not Currently    Alcohol/week: 1.0 standard drink of alcohol    Types: 1 Cans of beer per week   Drug use: No   Sexual activity: Not on file  Other Topics Concern   Not on file  Social History Narrative   Not on file   Social Determinants of Health   Financial Resource Strain: Not on file  Food Insecurity: Not on file  Transportation Needs: Not on file  Physical Activity: Not on file  Stress: Not on file  Social Connections: Not on file   Additional Social History:                         Sleep: Fair  Appetite:  Good  Current Medications: Current Facility-Administered Medications  Medication Dose Route Frequency Provider Last Rate Last Admin   acetaminophen (TYLENOL) tablet 650 mg  650 mg Oral Q6H PRN Jearld Lesch, NP   650 mg at 09/22/22 1524   albuterol (PROVENTIL) (2.5 MG/3ML) 0.083% nebulizer solution 2.5 mg  2.5 mg Nebulization Q6H PRN Jearld Lesch, NP       albuterol (VENTOLIN HFA) 108 (90 Base) MCG/ACT inhaler 2 puff  2 puff Inhalation BID Sarina Ill, DO   2 puff at 09/22/22 1116   ALPRAZolam (XANAX) tablet 0.5 mg  0.5 mg Oral QHS Sarina Ill, DO   0.5 mg at 09/21/22 2114   ALPRAZolam Prudy Feeler)  tablet 0.5 mg  0.5 mg Oral BID Sarina Ill, DO   0.5 mg at 09/22/22 1524   alum & mag hydroxide-simeth (MAALOX/MYLANTA) 200-200-20 MG/5ML suspension 30 mL  30 mL Oral Q4H PRN Jearld Lesch, NP       buPROPion (WELLBUTRIN XL) 24 hr tablet 150 mg  150 mg Oral Daily Sarina Ill, DO   150 mg at 09/22/22 0827   chlorproMAZINE (THORAZINE) tablet 150 mg  150 mg Oral QHS Darrie Macmillan T, MD       cloNIDine (CATAPRES) tablet 0.1 mg  0.1 mg Oral TID PRN Sarina Ill, DO   0.1 mg at 09/19/22 1634   fluticasone furoate-vilanterol (BREO ELLIPTA) 100-25 MCG/ACT 1 puff  1 puff Inhalation Daily Jearld Lesch, NP   1 puff at 09/22/22 4742   hydrOXYzine (ATARAX) tablet 25 mg  25 mg Oral TID PRN Jearld Lesch, NP   25 mg at 09/18/22 2116   irbesartan (AVAPRO) tablet 300 mg  300 mg Oral Daily Sarina Ill, DO   300 mg at 09/22/22 0827   magnesium hydroxide (MILK OF MAGNESIA) suspension 30 mL  30 mL Oral Daily PRN Jearld Lesch, NP   30 mL at 09/21/22 1521   pantoprazole (PROTONIX) EC tablet 40 mg  40 mg Oral QPC breakfast Sarina Ill, DO   40 mg at 09/22/22 0827   traZODone (DESYREL) tablet 50 mg  50 mg Oral QHS PRN Jearld Lesch, NP       zonisamide (ZONEGRAN) capsule 100 mg  100 mg Oral QHS Sarina Ill, DO   100 mg at 09/21/22 2114    Lab Results: No results found for this or any previous visit (from the past 48 hour(s)).  Blood Alcohol level:  Lab Results  Component Value Date   ETH <10 09/15/2022   ETH 309 ([facility]) 08/27/2016    Metabolic Disorder Labs: No results found for: "HGBA1C", "MPG" No results found for: "PROLACTIN" No results found for: "CHOL", "TRIG", "HDL", "CHOLHDL", "VLDL", "LDLCALC"  Physical Findings: AIMS:  , ,  ,  ,    CIWA:    COWS:     Musculoskeletal: Strength & Muscle Tone: within normal limits Gait & Station: normal Patient leans: N/A  Psychiatric Specialty Exam:  Presentation  General  Appearance:  Appropriate for Environment  Eye Contact: Fair  Speech: Clear and Coherent  Speech Volume: Normal  Handedness: Right   Mood and Affect  Mood: Euthymic; Anxious  Affect: Full Range   Thought Process  Thought  Processes: Coherent  Descriptions of Associations:Circumstantial  Orientation:Full (Time, Place and Person)  Thought Content:Delusions  History of Schizophrenia/Schizoaffective disorder:Yes  Duration of Psychotic Symptoms:Greater than six months  Hallucinations:No data recorded Ideas of Reference:Paranoia; Delusions  Suicidal Thoughts:No data recorded Homicidal Thoughts:No data recorded  Sensorium  Memory: Immediate Fair; Remote Fair  Judgment: Impaired  Insight: Fair   Chartered certified accountant: Fair  Attention Span: Fair  Recall: [employer] of Knowledge: Fair  Language: Fair   Psychomotor Activity  Psychomotor Activity:No data recorded  Assets  Assets: Desire for Improvement; Financial Resources/Insurance; Housing; Social Support   Sleep  Sleep:No data recorded   Physical Exam: Physical Exam Vitals and nursing note reviewed.  Constitutional:      Appearance: Normal appearance.  HENT:     Head: Normocephalic and atraumatic.     Mouth/Throat:     Pharynx: Oropharynx is clear.  Eyes:     Pupils: Pupils are equal, round, and reactive to light.  Cardiovascular:     Rate and Rhythm: Normal rate and regular rhythm.  Pulmonary:     Effort: Pulmonary effort is normal.     Breath sounds: Normal breath sounds.  Abdominal:     General: Abdomen is flat.     Palpations: Abdomen is soft.  Musculoskeletal:        General: Normal range of motion.  Skin:    General: Skin is warm and dry.  Neurological:     General: No focal deficit present.     Mental Status: She is alert. Mental status is at baseline.  Psychiatric:        Attention and Perception: Attention normal. She perceives auditory  hallucinations.        Mood and Affect: Mood normal.        Speech: Speech normal.        Behavior: Behavior is cooperative.        Thought Content: Thought content normal.        Cognition and Memory: Cognition normal.        Judgment: Judgment normal.    Review of Systems  Constitutional: Negative.   HENT: Negative.    Eyes: Negative.   Respiratory: Negative.    Cardiovascular: Negative.   Gastrointestinal: Negative.   Musculoskeletal: Negative.   Skin: Negative.   Neurological: Negative.   Psychiatric/Behavioral:  Positive for hallucinations. Negative for depression and suicidal ideas. The patient has insomnia.    Blood pressure 125/73, pulse (!) 114, temperature (!) 97.5 F (36.4 C), temperature source Oral, resp. rate 18, height 4\' 11"  (1.499 m), SpO2 97 %. Body mass index is 37 kg/m.   Treatment Plan Summary: Medication management and Plan patient tells me she would like to increase "that last medicine" to help her sleep.  I am not clear if that refers to the Thorazine or Zonegran.  It seems to me that possibly increasing the Thorazine slightly would be more typically related to improving sleep so I will go up to 150 mg on that.  No other change to medicine.  Supportive counseling.  Review of plan and situation with treatment team.  , MD 09/22/2022, 3:28 PM

## 2022-09-23 DIAGNOSIS — F203 Undifferentiated schizophrenia: Secondary | ICD-10-CM | POA: Diagnosis not present

## 2022-09-23 NOTE — [facility] Group Notes (Signed)
[facility] Group Notes: (Clinical Social Work)   09/23/2022      Type of Therapy:  Group Therapy   Participation Level:  Did Not Attend despite MHT and CSW prompting. Patient stayed sleeping in bed.    Tye Savoy, LCSW  09/23/2022 11:09 AM

## 2022-09-23 NOTE — Progress Notes (Signed)
Patient denies SI, HI, and AVH. She is calm and cooperative with assessment. Patient rates pain as a 10/10 in her back, Tylenol PRN given. Patient says she slept well last night. Patient ate breakfast in the dayroom and returned to her room to sleep. Patient remains safe on the unit at this time.

## 2022-09-23 NOTE — Progress Notes (Signed)
Patient is alert and oriented times 4. Mood and affect appropriate. Patient rates pain as 4/10. She denies SI, HI, and AVH. Patient also denies feelings of anxiety and depression at this time. Patient states she slept well last night. Evening medicines administered whole by mouth without difficulty. Patient ate snack in day room; appetite was fair. Patient remains on unit with Q15 minute checks in place.

## 2022-09-23 NOTE — Progress Notes (Signed)
[facility] MD Progress Note  09/23/2022 1:22 PM Krystal Lee  MRN:  [id number] Subjective: Patient seen and chart reviewed.  66 year old woman with schizophrenia.  Patient reports she is feeling much better overall.  Denies hallucinations.  Denies depression.  No suicidal thoughts.  Nursing reports her behavior has been fine and she has had no complaints. Principal Problem: Schizophrenia (HCC) Diagnosis: Principal Problem:   Schizophrenia (HCC)  Total Time spent with patient: 20 minutes  Past Psychiatric History: Past history of schizophrenia  Past Medical History:  Past Medical History:  Diagnosis Date   Anxiety    Arthritis    knees   Bipolar 1 disorder (HCC)    COPD (chronic obstructive pulmonary disease) (HCC)    Depression    GERD (gastroesophageal reflux disease)    Headache    during allergy season   Hepatitis C virus    Hypertension    Post-menopausal    Schizoaffective disorder (HCC)     Past Surgical History:  Procedure Laterality Date   CATARACT EXTRACTION  2010   right    CATARACT EXTRACTION W/PHACO Left 06/09/2019   Procedure: CATARACT EXTRACTION PHACO AND INTRAOCULAR LENS PLACEMENT ([facility]) COMPLICATED  LEFT;  Surgeon: [name], MD;  Location: [facility];  Service: Ophthalmology;  Laterality: Left;  VISION BLUE  [place]   LOWER EXTREMITY ANGIOGRAPHY Bilateral 08/31/2013   [facility], Dr. [name]   Family History:  Family History  Problem Relation Age of Onset   Breast cancer Paternal Grandmother    Bone cancer Paternal Grandmother    Pancreatic cancer Mother    Heart disease Father    Heart disease Paternal Uncle    Stroke Paternal Grandfather    Family Psychiatric  History: See previous Social History:  Social History   Substance and Sexual Activity  Alcohol Use Not Currently   Alcohol/week: 1.0 standard drink of alcohol   Types: 1 Cans of beer per week     Social History   Substance and Sexual Activity  Drug Use No    Social History    Socioeconomic History   Marital status: Widowed    Spouse name: Not on file   Number of children: Not on file   Years of education: Not on file   Highest education level: Not on file  Occupational History   Not on file  Tobacco Use   Smoking status: Former    Packs/day: 1.00    Years: 45.00    Total pack years: 45.00    Types: Cigarettes    Quit date: 05/01/2021    Years since quitting: 1.3   Smokeless tobacco: Never   Tobacco comments:    since age 68  Vaping Use   Vaping Use: Never used  Substance and Sexual Activity   Alcohol use: Not Currently    Alcohol/week: 1.0 standard drink of alcohol    Types: 1 Cans of beer per week   Drug use: No   Sexual activity: Not on file  Other Topics Concern   Not on file  Social History Narrative   Not on file   Social Determinants of Health   Financial Resource Strain: Not on file  Food Insecurity: Not on file  Transportation Needs: Not on file  Physical Activity: Not on file  Stress: Not on file  Social Connections: Not on file   Additional Social History:  Sleep: Fair  Appetite:  Fair  Current Medications: Current Facility-Administered Medications  Medication Dose Route Frequency Provider Last Rate Last Admin   acetaminophen (TYLENOL) tablet 650 mg  650 mg Oral Q6H PRN [name], NP   650 mg at 09/23/22 0834   albuterol (PROVENTIL) (2.5 MG/3ML) 0.083% nebulizer solution 2.5 mg  2.5 mg Nebulization Q6H PRN [name], NP       albuterol (VENTOLIN HFA) 108 (90 Base) MCG/ACT inhaler 2 puff  2 puff Inhalation BID [name], DO   2 puff at 09/23/22 1610   ALPRAZolam [name]) tablet 0.5 mg  0.5 mg Oral QHS [name], DO   0.5 mg at 09/22/22 2103   ALPRAZolam (XANAX) tablet 0.5 mg  0.5 mg Oral BID [name], DO   0.5 mg at 09/23/22 0831   alum & mag hydroxide-simeth (MAALOX/MYLANTA) 200-200-20 MG/5ML suspension 30 mL  30 mL Oral Q4H PRN  [name], NP       buPROPion (WELLBUTRIN XL) 24 hr tablet 150 mg  150 mg Oral Daily [name], DO   150 mg at 09/23/22 9604   chlorproMAZINE (THORAZINE) tablet 150 mg  150 mg Oral QHS Adalynd Donahoe T, MD   150 mg at 09/22/22 2103   cloNIDine (CATAPRES) tablet 0.1 mg  0.1 mg Oral TID PRN [name], DO   0.1 mg at 09/19/22 1634   fluticasone furoate-vilanterol (BREO ELLIPTA) 100-25 MCG/ACT 1 puff  1 puff Inhalation Daily [name], NP   1 puff at 09/23/22 0835   hydrOXYzine (ATARAX) tablet 25 mg  25 mg Oral TID PRN [name], NP   25 mg at 09/18/22 2116   irbesartan (AVAPRO) tablet 300 mg  300 mg Oral Daily [name], DO   300 mg at 09/23/22 0831   magnesium hydroxide (MILK OF MAGNESIA) suspension 30 mL  30 mL Oral Daily PRN [name], NP   30 mL at 09/21/22 1521   pantoprazole (PROTONIX) EC tablet 40 mg  40 mg Oral QPC breakfast [name], DO   40 mg at 09/23/22 0831   traZODone (DESYREL) tablet 50 mg  50 mg Oral QHS PRN [name], NP       zonisamide (ZONEGRAN) capsule 100 mg  100 mg Oral QHS [name], DO   100 mg at 09/22/22 2104    Lab Results: No results found for this or any previous visit (from the past 48 hour(s)).  Blood Alcohol level:  Lab Results  Component Value Date   ETH <10 09/15/2022   ETH 309 ([facility]) 54/07/8118    Metabolic Disorder Labs: No results found for: "HGBA1C", "MPG" No results found for: "PROLACTIN" No results found for: "CHOL", "TRIG", "HDL", "CHOLHDL", "VLDL", "LDLCALC"  Physical Findings: AIMS:  , ,  ,  ,    CIWA:    COWS:     Musculoskeletal: Strength & Muscle Tone: within normal limits Gait & Station: normal Patient leans: N/A  Psychiatric Specialty Exam:  Presentation  General Appearance:  Appropriate for Environment  Eye Contact: Fair  Speech: Clear and Coherent  Speech Volume: Normal  Handedness: Right   Mood and Affect   Mood: Euthymic; Anxious  Affect: Full Range   Thought Process  Thought Processes: Coherent  Descriptions of Associations:Circumstantial  Orientation:Full (Time, Place and Person)  Thought Content:Delusions  History of Schizophrenia/Schizoaffective disorder:Yes  Duration of Psychotic Symptoms:Greater than six months  Hallucinations:No data  recorded Ideas of Reference:Paranoia; Delusions  Suicidal Thoughts:No data recorded Homicidal Thoughts:No data recorded  Sensorium  Memory: Immediate Fair; Remote Fair  Judgment: Impaired  Insight: Fair   Chartered certified accountant: Fair  Attention Span: Fair  Recall: [employer] of Knowledge: Fair  Language: Fair   Psychomotor Activity  Psychomotor Activity:No data recorded  Assets  Assets: Desire for Improvement; Financial Resources/Insurance; Housing; Social Support   Sleep  Sleep:No data recorded   Physical Exam: Physical Exam Vitals reviewed.  Constitutional:      Appearance: Normal appearance.  HENT:     Head: Normocephalic and atraumatic.     Mouth/Throat:     Pharynx: Oropharynx is clear.  Eyes:     Pupils: Pupils are equal, round, and reactive to light.  Cardiovascular:     Rate and Rhythm: Normal rate and regular rhythm.  Pulmonary:     Effort: Pulmonary effort is normal.     Breath sounds: Normal breath sounds.  Abdominal:     General: Abdomen is flat.     Palpations: Abdomen is soft.  Musculoskeletal:        General: Normal range of motion.  Skin:    General: Skin is warm and dry.  Neurological:     General: No focal deficit present.     Mental Status: She is alert. Mental status is at baseline.  Psychiatric:        Mood and Affect: Mood normal.        Thought Content: Thought content normal.    Review of Systems  Constitutional: Negative.   HENT: Negative.    Eyes: Negative.   Respiratory: Negative.    Cardiovascular: Negative.   Gastrointestinal: Negative.    Musculoskeletal: Negative.   Skin: Negative.   Neurological: Negative.   Psychiatric/Behavioral: Negative.     Blood pressure (!) 100/59, pulse (!) 112, temperature 97.7 F (36.5 C), temperature source Oral, resp. rate 16, height 4\' 11"  (1.499 m), SpO2 97 %. Body mass index is 37 kg/m.   Treatment Plan Summary: Plan no change to medication.  Supportive counseling.  Encourage patient to let staff know if there are problems and to talk with the treatment team tomorrow about discharge  , MD 09/23/2022, 1:22 PM

## 2022-09-24 DIAGNOSIS — F209 Schizophrenia, unspecified: Secondary | ICD-10-CM | POA: Diagnosis not present

## 2022-09-24 MED ORDER — BUPROPION HCL ER (XL) 150 MG PO TB24: 150.0000 mg | ORAL_TABLET | Freq: Every day | ORAL | 3 refills | Status: DC

## 2022-09-24 MED ORDER — ZONISAMIDE 100 MG PO CAPS: 100.0000 mg | ORAL_CAPSULE | Freq: Every day | ORAL | 3 refills | Status: DC

## 2022-09-24 MED ORDER — CHLORPROMAZINE HCL 50 MG PO TABS: 150.0000 mg | ORAL_TABLET | Freq: Every day | ORAL | 3 refills | Status: DC

## 2022-09-24 MED ORDER — PANTOPRAZOLE SODIUM 40 MG PO TBEC: 40.0000 mg | DELAYED_RELEASE_TABLET | Freq: Every day | ORAL | 3 refills | Status: AC

## 2022-09-24 NOTE — Care Management Important Message (Signed)
Important Message  Patient Details  Name: Krystal Lee MRN: [id number] Date of Birth: Apr 06, 1956   Medicare Important Message Given:  Yes     Rozann Lesches, LCSW 09/24/2022, 10:05 AM

## 2022-09-24 NOTE — Progress Notes (Addendum)
Patient is discharging at this time. Patient is A&Ox4. Stable condition. Patient denies SI,HI, and A/V/H with no plan/intent. Printed AVS reviewed with and given to patient along with medications and follow up appointments. Patient verbalized all understanding. All valuables/belongings returned to patient. Patient is being transported by her sister. No s/s of current distress.

## 2022-09-24 NOTE — Discharge Summary (Signed)
Physician Discharge Summary Note  Patient:  Krystal Lee is an 66 y.o., female MRN:  [id number] DOB:  1956/05/22 Patient phone:  [phone] (home)  Patient address:   [address] [place] [address],  Total Time spent with patient: 1 hour  Date of Admission:  09/16/2022 Date of Discharge: 09/24/2022  Reason for Admission:  K[place] Lee is a 65 year old white female who is voluntarily admitted to inpatient psychiatry for depression and visual hallucinations.  [place] says that she has visions of animals and hears animals scratching at night.  She has a long history of COPD and wears oxygen at nighttime.  She denies any auditory hallucinations except for those related to her visual hallucinations.  She says this has been going on for about 2 months and she is trying to play around with her medications to see if that is the problem.  She is on blood pressure medication and Seroquel.  She has been on Seroquel for the past 5 years and now that she is decreased to 200 mg is not helping her sleep.  She does complain of weight gain from it.  She currently lives alone in an apartment in [place] and has a lot of family help.  She has a history of hypertension, COPD, chronic back pain and aortic stenosis.  The last psychiatrist she saw was about 5 years ago in [place].  Dr. Omelia [place].  She has a past history of heroin and alcohol abuse and was in prison for check fraud and aggressive behavior.  She has been clean and sober for many years and has been off cigarettes off and on.  She currently denies any suicidal ideation.  She is looking to solve her depression and visual hallucinations.  She says that Seroquel worked at first but is not at a lower dose.  I spoke to her about Wellbutrin and Thorazine and she is in agreement.   Principal Problem: Schizophrenia [facility]) Discharge Diagnoses: Principal Problem:   Schizophrenia [facility])   Past Psychiatric History: Quite extensive.  She has not seen a  psychiatrist in 5 years and the last one was Dr. Omelia [place]  She has been psychiatrically hospitalized one time prior here at [facility].  She has been involved in outpatient with numerous psychiatrists in [place] including Dr. Adriana Lee for and Dr. Garnetta Lee. She went through [facility] in 2011.   Past Medical History:  Past Medical History:  Diagnosis Date   Anxiety    Arthritis    knees   Bipolar 1 disorder (HCC)    COPD (chronic obstructive pulmonary disease) (HCC)    Depression    GERD (gastroesophageal reflux disease)    Headache    during allergy season   Hepatitis C virus    Hypertension    Post-menopausal    Schizoaffective disorder (HCC)     Past Surgical History:  Procedure Laterality Date   CATARACT EXTRACTION  2010   right    CATARACT EXTRACTION W/PHACO Left 06/09/2019   Procedure: CATARACT EXTRACTION PHACO AND INTRAOCULAR LENS PLACEMENT (IOC) COMPLICATED  LEFT;  Surgeon: [place] Manila, MD;  [facility];  Service: Ophthalmology;  Laterality: Left;  VISION BLUE  OMIDRIA   LOWER EXTREMITY ANGIOGRAPHY Bilateral 08/31/2013   [facility], Dr. [name]   Family History:  Family History  Problem Relation Age of Onset   Breast cancer Paternal Grandmother    Bone cancer Paternal Grandmother    Pancreatic cancer Mother    Heart disease Father  Heart disease Paternal Uncle    Stroke Paternal Grandfather    Family Psychiatric  History: Unremarkable Social History:  Social History   Substance and Sexual Activity  Alcohol Use Not Currently   Alcohol/week: 1.0 standard drink of alcohol   Types: 1 Cans of beer per week     Social History   Substance and Sexual Activity  Drug Use No    Social History   Socioeconomic History   Marital status: Widowed    Spouse name: Not on file   Number of children: Not on file   Years of education: Not on file   Highest education level: Not on file  Occupational History   Not on file  Tobacco Use   Smoking status:  Former    Packs/day: 1.00    Years: 45.00    Total pack years: 45.00    Types: Cigarettes    Quit date: 05/01/2021    Years since quitting: 1.4   Smokeless tobacco: Never   Tobacco comments:    since age 69  Vaping Use   Vaping Use: Never used  Substance and Sexual Activity   Alcohol use: Not Currently    Alcohol/week: 1.0 standard drink of alcohol    Types: 1 Cans of beer per week   Drug use: No   Sexual activity: Not on file  Other Topics Concern   Not on file  Social History Narrative   Not on file   Social Determinants of Health   Financial Resource Strain: Not on file  Food Insecurity: Not on file  Transportation Needs: Not on file  Physical Activity: Not on file  Stress: Not on file  Social Connections: Not on file    Hospital Course: [place] is a 66 year old white female who was voluntarily admitted to inpatient psychiatry for worsening depression, insomnia, and visual hallucinations.  She complained of weight gain and Seroquel and stated that it was not helping with her visual hallucinations or sleep.  It was discontinued and Thorazine was added which was titrated up to 150 mg at bedtime which she tolerated well.  Because of the weight gain and sleep problems Zonegran 100 mg was added at bedtime.  She did very well with both of these medications.  Due to her depression Wellbutrin XL 150 mg was started for mood and weight loss.  She liked the medication changes.  She participated in groups.  She interacted well with staff and peers.  She denied any side effects from her medication.  Her visual hallucinations subsided and her sleep improved.  It was felt that she maximized hospitalization she was discharged home.  On the day of discharge she denied suicidal ideation or homicidal ideation, auditory or visual hallucinations, her judgment and insight were good.  Physical Findings: AIMS:  , ,  ,  ,    CIWA:    COWS:     Musculoskeletal: Strength & Muscle Tone: within normal  limits Gait & Station: normal Patient leans: N/A   Psychiatric Specialty Exam:  Presentation  General Appearance:  Appropriate for Environment  Eye Contact: Fair  Speech: Clear and Coherent  Speech Volume: Normal  Handedness: Right   Mood and Affect  Mood: Euthymic; Anxious  Affect: Full Range   Thought Process  Thought Processes: Coherent  Descriptions of Associations:Circumstantial  Orientation:Full (Time, Place and Person)  Thought Content:Delusions  History of Schizophrenia/Schizoaffective disorder:Yes  Duration of Psychotic Symptoms:Greater than six months  Hallucinations:No data recorded Ideas of Reference:Paranoia; Delusions  Suicidal Thoughts:No  data recorded Homicidal Thoughts:No data recorded  Sensorium  Memory: Immediate Fair; Remote Fair  Judgment: Impaired  Insight: Fair   Materials engineer: Fair  Attention Span: Fair  Recall: [employer] of Knowledge: Fair  Language: Fair   Psychomotor Activity  Psychomotor Activity:No data recorded  Assets  Assets: Desire for Improvement; Financial Resources/Insurance; Housing; Social Support   Sleep  Sleep:No data recorded   Physical Exam: Physical Exam Vitals and nursing note reviewed.  Constitutional:      Appearance: Normal appearance. She is normal weight.  Neurological:     General: No focal deficit present.     Mental Status: She is alert and oriented to person, place, and time.  Psychiatric:        Attention and Perception: Attention and perception normal.        Mood and Affect: Mood and affect normal.        Speech: Speech normal.        Behavior: Behavior normal. Behavior is cooperative.        Thought Content: Thought content normal.        Cognition and Memory: Cognition and memory normal.        Judgment: Judgment normal.    Review of Systems  Constitutional: Negative.   HENT: Negative.    Eyes: Negative.   Respiratory:  Negative.    Cardiovascular: Negative.   Gastrointestinal: Negative.   Genitourinary: Negative.   Musculoskeletal: Negative.   Skin: Negative.   Neurological: Negative.   Endo/Heme/Allergies: Negative.   Psychiatric/Behavioral: Negative.     Blood pressure (!) 93/54, pulse 94, temperature 97.8 F (36.6 C), temperature source Oral, resp. rate 18, height 4\' 11"  (1.499 m), SpO2 96 %. Body mass index is 37 kg/m.   Social History   Tobacco Use  Smoking Status Former   Packs/day: 1.00   Years: 45.00   Total pack years: 45.00   Types: Cigarettes   Quit date: 05/01/2021   Years since quitting: 1.4  Smokeless Tobacco Never  Tobacco Comments   since age 89   Tobacco Cessation:  A prescription for an FDA-approved tobacco cessation medication provided at discharge   Blood Alcohol level:  Lab Results  Component Value Date   ETH <10 09/15/2022   ETH 309 ([place]) 11/91/4782    Metabolic Disorder Labs:  No results found for: "HGBA1C", "MPG" No results found for: "PROLACTIN" No results found for: "CHOL", "TRIG", "HDL", "CHOLHDL", "VLDL", "[place]"  See Psychiatric Specialty Exam and Suicide Risk Assessment completed by Attending Physician prior to discharge.  Discharge destination:  Home  Is patient on multiple antipsychotic therapies at discharge:  No   Has Patient had three or more failed trials of antipsychotic monotherapy by history:  No  Recommended Plan for Multiple Antipsychotic Therapies: NA   Allergies as of 09/24/2022   No Known Allergies      Medication List     STOP taking these medications    QUEtiapine 100 MG tablet Commonly known as: SEROQUEL       TAKE these medications      Indication  albuterol (2.5 MG/3ML) 0.083% nebulizer solution Commonly known as: PROVENTIL Take 2.5 mg by nebulization every 6 (six) hours as needed for wheezing or shortness of breath.    albuterol 108 (90 Base) MCG/ACT inhaler Commonly known as: VENTOLIN HFA Inhale 2 puffs  into the lungs every 4 (four) hours as needed for shortness of breath or wheezing.    ALPRAZolam 0.5 MG tablet Commonly  known as: XANAX Take 0.5 mg by mouth 3 (three) times daily as needed for anxiety.    buPROPion 150 MG 24 hr tablet Commonly known as: WELLBUTRIN XL Take 1 tablet (150 mg total) by mouth daily. Start taking on: September 25, 2022  Indication: Major Depressive Disorder   chlorproMAZINE 50 MG tablet Commonly known as: THORAZINE Take 3 tablets (150 mg total) by mouth at bedtime.  Indication: Feeling Anxious, Psychosis   cholecalciferol 25 MCG (1000 UNIT) tablet Commonly known as: VITAMIN D3 Take 1,000 Units by mouth daily.    Fluticasone-Umeclidin-Vilant 100-62.5-25 MCG/ACT Aepb Inhale 1 puff into the lungs daily.    furosemide 20 MG tablet Commonly known as: LASIX Take 1 tablet (20 mg total) by mouth daily.    montelukast 10 MG tablet Commonly known as: SINGULAIR Take 10 mg by mouth at bedtime.    pantoprazole 40 MG tablet Commonly known as: PROTONIX Take 1 tablet (40 mg total) by mouth daily after breakfast. Start taking on: September 25, 2022  Indication: Gastroesophageal Reflux Disease, Heartburn   roflumilast 500 MCG Tabs tablet Commonly known as: DALIRESP Take 500 mcg by mouth daily.    rosuvastatin 40 MG tablet Commonly known as: CRESTOR Take 40 mg by mouth at bedtime.    valsartan 80 MG tablet Commonly known as: DIOVAN Take 1 tablet (80 mg total) by mouth daily.  Indication: High Blood Pressure Disorder   zonisamide 100 MG capsule Commonly known as: ZONEGRAN Take 1 capsule (100 mg total) by mouth at bedtime.  Indication: Binge Eating Disorder        Follow-up Information     [facility] Follow up.   Why: Please contact if you would like to be scheduled for therapy and/or medication management. Contact information: [address] [place] [address] [phone]         [facility] Follow up.   Why: Please contact if you would like to be scheduled for therapy and/or medication management. Contact information: [address] [place] [address] [phone]         [facility] Follow up.   Why: Please contact if you would like to be scheduled for therapy and/or medication management. Contact information: [address] [place] [address] [phone]                 Follow-up recommendations:  As above    Signed: Sarina Ill, DO 09/24/2022, 10:38 AM

## 2022-09-24 NOTE — Progress Notes (Signed)
  [facility] Adult Case Management Discharge Plan :  Will you be returning to the same living situation after discharge:  Yes,  pt reports that she is returning home. At discharge, do you have transportation home?: Yes,  Pt reports that her sister will provide transportation. Do you have the ability to pay for your medications: Yes,  AET[employer] MEDICARE / AETNA MEDICARE HMO/PPO  Release of information consent forms completed and in the chart;  Patient's signature needed at discharge.  Patient to Follow up at:  Follow-up [facility] Follow up.   Why: Please contact if you would like to be scheduled for therapy and/or medication management. Contact information: [place] [address] [place], [facility] Follow up.   Why: Please contact if you would like to be scheduled for therapy and/or medication management. Contact information: [address] [place] [address] [phone]         [facility] Follow up.   Why: Please contact if you would like to be scheduled for therapy and/or medication management. Contact information: [place] [address] [phone]                 Next level of care provider has access to [place] and Suicide Prevention discussed: Yes,  SPE completed with the patient.      Has patient been referred to the Quitline?: Patient refused referral  Patient has been referred for addiction treatment: Pt. refused referral  Rozann Lesches, LCSW 09/24/2022, 11:06 AM

## 2022-09-24 NOTE — [facility] Suicide Risk Assessment (Signed)
[facility] Discharge Suicide Risk Assessment   Principal Problem: Schizophrenia [facility]) Discharge Diagnoses: Principal Problem:   Schizophrenia ([place])   Total Time spent with patient: 1 hour  Musculoskeletal: Strength & Muscle Tone: within normal limits Gait & Station: normal Patient leans: N/A  Psychiatric Specialty Exam  Presentation  General Appearance:  Appropriate for Environment  Eye Contact: Fair  Speech: Clear and Coherent  Speech Volume: Normal  Handedness: Right   Mood and Affect  Mood: Euthymic; Anxious  Duration of Depression Symptoms: No data recorded Affect: Full Range   Thought Process  Thought Processes: Coherent  Descriptions of Associations:Circumstantial  Orientation:Full (Time, Place and Person)  Thought Content:Delusions  History of Schizophrenia/Schizoaffective disorder:Yes  Duration of Psychotic Symptoms:Greater than six months  Hallucinations:No data recorded Ideas of Reference:Paranoia; Delusions  Suicidal Thoughts:No data recorded Homicidal Thoughts:No data recorded  Sensorium  Memory: Immediate Fair; Remote Fair  Judgment: Impaired  Insight: Fair   Materials engineer: Fair  Attention Span: Fair  Recall: [employer] of Knowledge: Fair  Language: Fair   Psychomotor Activity  Psychomotor Activity:No data recorded  Assets  Assets: Desire for Improvement; Financial Resources/Insurance; Housing; Social Support   Sleep  Sleep:No data recorded  Physical Exam: Physical Exam Vitals and nursing note reviewed.  Constitutional:      Appearance: Normal appearance. She is normal weight.  Neurological:     General: No focal deficit present.     Mental Status: She is alert and oriented to person, place, and time.  Psychiatric:        Attention and Perception: Attention and perception normal.        Mood and Affect: Mood and affect normal.        Speech: Speech normal.        Behavior:  Behavior normal. Behavior is cooperative.        Thought Content: Thought content normal.        Cognition and Memory: Cognition and memory normal.        Judgment: Judgment normal.    Review of Systems  Constitutional: Negative.   HENT: Negative.    Eyes: Negative.   Respiratory: Negative.    Cardiovascular: Negative.   Gastrointestinal: Negative.   Genitourinary: Negative.   Musculoskeletal: Negative.   Skin: Negative.   Neurological: Negative.   Endo/Heme/Allergies: Negative.   Psychiatric/Behavioral: Negative.     Blood pressure (!) 93/54, pulse 94, temperature 97.8 F (36.6 C), temperature source Oral, resp. rate 18, height 4\' 11"  (1.499 m), SpO2 96 %. Body mass index is 37 kg/m.  Mental Status Per Nursing Assessment::   On Admission:   (Patient denying SI/HI,)  Demographic Factors:  Caucasian and Living alone  Loss Factors: NA  Historical Factors: NA  Risk Reduction Factors:   NA  Continued Clinical Symptoms:  Depression:   Anhedonia  Cognitive Features That Contribute To Risk:  None    Suicide Risk:  Minimal: No identifiable suicidal ideation.  Patients presenting with no risk factors but with morbid ruminations; may be classified as minimal risk based on the severity of the depressive symptoms   Follow[facility]up [facility] Follow up.   Why: Please contact if you would like to be scheduled for therapy and/or medication management. Contact information: [place] [address] [place], [facility] Follow up.   Why: Please contact if you would like to be scheduled for  therapy and/or medication management. Contact information: [address] [place] [address] [phone]         [facility] Follow up.   Why: Please contact if you would like to be scheduled for therapy and/or medication management. Contact  information: [place] [address] [phone]                 Plan Of Care/Follow-up recommendations: As above   [name], DO 09/24/2022, 10:27 AM

## 2022-09-25 ENCOUNTER — Encounter: Payer: Self-pay | Admitting: Pulmonary Disease

## 2022-09-25 ENCOUNTER — Ambulatory Visit: Payer: Medicare HMO | Admitting: Pulmonary Disease

## 2022-09-25 VITALS — BP 120/72 | HR 127 | Temp 98.0°F | Ht 59.0 in | Wt 183.0 lb

## 2022-09-25 DIAGNOSIS — R0602 Shortness of breath: Secondary | ICD-10-CM | POA: Diagnosis not present

## 2022-09-25 DIAGNOSIS — F17210 Nicotine dependence, cigarettes, uncomplicated: Secondary | ICD-10-CM

## 2022-09-25 DIAGNOSIS — R911 Solitary pulmonary nodule: Secondary | ICD-10-CM | POA: Diagnosis not present

## 2022-09-25 DIAGNOSIS — J449 Chronic obstructive pulmonary disease, unspecified: Secondary | ICD-10-CM | POA: Diagnosis not present

## 2022-09-25 NOTE — Patient Instructions (Signed)
We are scheduling some breathing test.  We are scheduling another CT of the chest in 8 weeks.  We will see you in follow-up in 8 to 12 weeks.  Please call sooner should any new problems arise.

## 2022-09-25 NOTE — Progress Notes (Signed)
Subjective:    Patient ID: Krystal Lee, female    DOB: Jan 25, 1956, 66 y.o.   MRN: [id number] Patient Care Team: Armando Gang, FNP as PCP - General (Family Medicine) Armando Gang, FNP (Family Medicine)  Chief Complaint  Patient presents with   Consult    Lung Nodule/COPD. SOB, wheezing and dry cough.     HPI Patient is a 66 year old current smoker who presents for evaluation of the lung nodule noted on CT angio chest 16 September 2022 and new from low-dose CT scan of the chest performed 23 January 2022.  She is kindly referred by Dr. Dorothea Glassman from [facility] ED.  The patient presented to [facility] ED on 15 September 2022 with a complaint of pleuritic chest pain on the left anterior chest along with shortness of breath.  She is a current smoker smoking approximately a pack a day.  She does not endorse any fevers, chills or sweats but did endorse issues with wheezing and shortness of breath on exertion.  She is enrolled in lung cancer screening program and had had a RADS 2 lung cancer screening CT in 23 January 2022.  Patient had a CT angio chest on 29 October working up the issue of the pleuritic chest pain.  An 11 mm lung nodule was noted on the left upper lobe which appears inflammatory.  However this will need further follow-up.  The patient does not endorse any weight loss or anorexia.  No hemoptysis.  She has had a dry cough.  No other symptomatology except as noted above.   Review of Systems A 10 point review of systems was performed and it is as noted above otherwise negative.  Past Medical History:  Diagnosis Date   Anxiety    Arthritis    knees   Bipolar 1 disorder (HCC)    COPD (chronic obstructive pulmonary disease) (HCC)    Depression    GERD (gastroesophageal reflux disease)    Headache    during allergy season   Hepatitis C virus    Hypertension    Post-menopausal    Schizoaffective disorder [facility])    Past Surgical History:  Procedure Laterality Date   CATARACT  EXTRACTION  2010   right    CATARACT EXTRACTION W/PHACO Left 06/09/2019   Procedure: CATARACT EXTRACTION PHACO AND INTRAOCULAR LENS PLACEMENT (IOC) COMPLICATED  LEFT;  Surgeon: [name], MD;  Location: [facility];  Service: Ophthalmology;  Laterality: Left;  VISION BLUE  [place]   LOWER EXTREMITY ANGIOGRAPHY Bilateral 08/31/2013   [facility], Dr. [name]   Patient Active Problem List   Diagnosis Date Noted   Schizophrenia, paranoid (HCC) 09/16/2022   Hallucinations 09/16/2022   Dyspnea 09/16/2022   Atypical chest pain 09/16/2022   Schizophrenia (HCC) 09/16/2022   Bipolar 1 disorder (HCC)    Depression    Anxiety    Hypertension    Sepsis (HCC)    HLD (hyperlipidemia)    GERD (gastroesophageal reflux disease)    Tobacco abuse    COPD exacerbation (HCC) 07/28/2018   Acute on chronic respiratory failure with hypoxia and hypercapnia (HCC) 09/16/2017   COPD with acute exacerbation (HCC) 09/16/2017   Family History  Problem Relation Age of Onset   Breast cancer Paternal Grandmother    Bone cancer Paternal Grandmother    Pancreatic cancer Mother    Heart disease Father    Heart disease Paternal Uncle    Stroke Paternal Grandfather    Social History   Tobacco Use  Smoking status: Every Day    Packs/day: 1.00    Years: 45.00    Additional pack years: 0.00    Total pack years: 45.00    Types: Cigarettes    Last attempt to quit: 05/01/2021    Years since quitting: 1.9   Smokeless tobacco: Never   Tobacco comments:    since age 62      Substance Use Topics   Alcohol use: Not Currently    Alcohol/week: 1.0 standard drink of alcohol    Types: 1 Cans of beer per week   Current Meds  Medication Sig   albuterol (PROVENTIL) (2.5 MG/3ML) 0.083% nebulizer solution Take 2.5 mg by nebulization every 6 (six) hours as needed for wheezing or shortness of breath.   albuterol (VENTOLIN HFA) 108 (90 Base) MCG/ACT inhaler Inhale 2 puffs into the lungs every 4 (four) hours as  needed for shortness of breath or wheezing.   ALPRAZolam (XANAX) 0.5 MG tablet Take 0.5 mg by mouth 3 (three) times daily as needed for anxiety.   buPROPion (WELLBUTRIN XL) 150 MG 24 hr tablet Take 1 tablet (150 mg total) by mouth daily. (Patient not taking: Reported on 03/05/2023)   chlorproMAZINE (THORAZINE) 50 MG tablet Take 3 tablets (150 mg total) by mouth at bedtime. (Patient not taking: Reported on 03/05/2023)   cholecalciferol (VITAMIN D3) 25 MCG (1000 UNIT) tablet Take 1,000 Units by mouth daily.   Fluticasone-Umeclidin-Vilant 100-62.5-25 MCG/ACT AEPB Inhale 1 puff into the lungs daily.   furosemide (LASIX) 20 MG tablet Take 1 tablet (20 mg total) by mouth daily.   montelukast (SINGULAIR) 10 MG tablet Take 10 mg by mouth at bedtime.   pantoprazole (PROTONIX) 40 MG tablet Take 1 tablet (40 mg total) by mouth daily after breakfast.   roflumilast (DALIRESP) 500 MCG TABS tablet Take 500 mcg by mouth daily.   rosuvastatin (CRESTOR) 40 MG tablet Take 40 mg by mouth at bedtime.   valsartan (DIOVAN) 80 MG tablet Take 1 tablet (80 mg total) by mouth daily.   zonisamide (ZONEGRAN) 100 MG capsule Take 1 capsule (100 mg total) by mouth at bedtime.   Immunization History  Administered Date(s) Administered   Influenza,inj,Quad PF,6+ Mos 09/17/2017   Influenza-Unspecified 09/24/2022   Pneumococcal Polysaccharide-23 09/17/2017       Objective:   Physical Exam BP 120/72 (BP Location: Left Arm, Cuff Size: Normal)   Pulse (!) 127   Temp 98 F (36.7 C)   Ht 4\' 11"  (1.499 m)   Wt 183 lb (83 kg) Comment: per patient. she is in a wheelchair  SpO2 95%   BMI 36.96 kg/m   SpO2: 95 % O2 Device: None (Room air)  GENERAL: Well-developed, overweight woman, no acute distress.  Fully ambulatory.  No conversational dyspnea. HEAD: Normocephalic, atraumatic.  EYES: Pupils equal, round, reactive to light.  No scleral icterus.  MOUTH: Oral mucosa moist.  No thrush. NECK: Supple. No thyromegaly. Trachea  midline. No JVD.  No adenopathy. PULMONARY: Good air entry bilaterally.  Coarse, otherwise no adventitious sounds. CARDIOVASCULAR: S1 and S2. Regular rate and rhythm.  ABDOMEN: Benign. MUSCULOSKELETAL: No joint deformity, no clubbing, no edema.  NEUROLOGIC: No overt focal deficit, no gait disturbance, speech is fluent. SKIN: Intact,warm,dry. PSYCH: Mood and behavior normal  CT chest have been reviewed including March and October studies.  Patient has significant COPD noted (emphysema) the nodule on the October film appears to have inflammatory contours.     Assessment & Plan:     ICD-10-CM  1. Lung nodule  R91.1 CT CHEST WO CONTRAST   Will need to repeat CT Recommend follow-up CT in 8 weeks    2. SOB (shortness of breath)  R06.02 Pulmonary Function Test [facility] Only   Suspect due to poorly compensated COPD Will obtain PFTs    3. COPD suggested by initial evaluation (HCC)  J44.9    Continue albuterol, Trelegy and Daliresp as she is currently doing STOP SMOKING!!!    4. Tobacco dependence due to cigarettes  F17.210    Patient counseled with regards to discontinuation of smoking Total counseling time 3 to 5 minutes     Orders Placed This Encounter  Procedures   CT CHEST WO CONTRAST    In 2 months    Standing Status:   Future    Standing Expiration Date:   09/26/2023    Order Specific Question:   Preferred imaging location?    Answer:   [facility]   Pulmonary Function Test [facility] Only    Standing Status:   Future    Standing Expiration Date:   09/26/2023    Order Specific Question:   Full PFT: includes the following: basic spirometry, spirometry pre & post bronchodilator, diffusion capacity (DLCO), lung volumes    Answer:   Full PFT    Order Specific Question:   This test can only be performed at    Answer:   [facility]   We have ordered the appropriate testing as noted above.  We will see the patient in follow-up in 8 to 12 weeks time she is to contact us prior to  that time should any new difficulties arise.  Gailen Shelter, MD Advanced Bronchoscopy PCCM [facility]    *This note was dictated using voice recognition software/Dragon.  Despite best efforts to proofread, errors can occur which can change the meaning. Any transcriptional errors that result from this process are unintentional and may not be fully corrected at the time of dictation.

## 2022-09-26 NOTE — Telephone Encounter (Signed)
The recent CT chest did not show anything that could be causing her left-sided pain.  We can call in a Azithromycin Z-Pak.

## 2022-09-26 NOTE — Telephone Encounter (Signed)
Dr. Gonzalez, please advise. Thanks 

## 2022-11-21 ENCOUNTER — Encounter: Payer: Self-pay | Admitting: *Deleted

## 2022-11-21 ENCOUNTER — Emergency Department: Payer: Medicare HMO

## 2022-11-21 ENCOUNTER — Other Ambulatory Visit: Payer: Self-pay

## 2022-11-21 DIAGNOSIS — R000 Tachycardia, unspecified: Secondary | ICD-10-CM | POA: Diagnosis present

## 2022-11-21 DIAGNOSIS — Z9842 Cataract extraction status, left eye: Secondary | ICD-10-CM

## 2022-11-21 DIAGNOSIS — R0602 Shortness of breath: Secondary | ICD-10-CM | POA: Diagnosis not present

## 2022-11-21 DIAGNOSIS — Z9841 Cataract extraction status, right eye: Secondary | ICD-10-CM

## 2022-11-21 DIAGNOSIS — Z961 Presence of intraocular lens: Secondary | ICD-10-CM | POA: Diagnosis present

## 2022-11-21 DIAGNOSIS — E785 Hyperlipidemia, unspecified: Secondary | ICD-10-CM | POA: Diagnosis present

## 2022-11-21 DIAGNOSIS — J441 Chronic obstructive pulmonary disease with (acute) exacerbation: Secondary | ICD-10-CM | POA: Diagnosis not present

## 2022-11-21 DIAGNOSIS — Z8619 Personal history of other infectious and parasitic diseases: Secondary | ICD-10-CM

## 2022-11-21 DIAGNOSIS — Z79899 Other long term (current) drug therapy: Secondary | ICD-10-CM

## 2022-11-21 DIAGNOSIS — F200 Paranoid schizophrenia: Secondary | ICD-10-CM | POA: Diagnosis present

## 2022-11-21 DIAGNOSIS — F319 Bipolar disorder, unspecified: Secondary | ICD-10-CM | POA: Diagnosis present

## 2022-11-21 DIAGNOSIS — Z7951 Long term (current) use of inhaled steroids: Secondary | ICD-10-CM

## 2022-11-21 DIAGNOSIS — E876 Hypokalemia: Secondary | ICD-10-CM | POA: Diagnosis present

## 2022-11-21 DIAGNOSIS — Z20822 Contact with and (suspected) exposure to covid-19: Secondary | ICD-10-CM | POA: Diagnosis present

## 2022-11-21 DIAGNOSIS — I10 Essential (primary) hypertension: Secondary | ICD-10-CM | POA: Diagnosis present

## 2022-11-21 DIAGNOSIS — Z8249 Family history of ischemic heart disease and other diseases of the circulatory system: Secondary | ICD-10-CM

## 2022-11-21 DIAGNOSIS — K219 Gastro-esophageal reflux disease without esophagitis: Secondary | ICD-10-CM | POA: Diagnosis present

## 2022-11-21 DIAGNOSIS — M170 Bilateral primary osteoarthritis of knee: Secondary | ICD-10-CM | POA: Diagnosis present

## 2022-11-21 DIAGNOSIS — Z780 Asymptomatic menopausal state: Secondary | ICD-10-CM

## 2022-11-21 DIAGNOSIS — Z791 Long term (current) use of non-steroidal anti-inflammatories (NSAID): Secondary | ICD-10-CM

## 2022-11-21 DIAGNOSIS — F17210 Nicotine dependence, cigarettes, uncomplicated: Secondary | ICD-10-CM | POA: Diagnosis present

## 2022-11-21 LAB — CBC
HCT: 40 % (ref 36.0–46.0)
Hemoglobin: 13 g/dL (ref 12.0–15.0)
MCH: 29.1 pg (ref 26.0–34.0)
MCHC: 32.5 g/dL (ref 30.0–36.0)
MCV: 89.5 fL (ref 80.0–100.0)
Platelets: 301 10*3/uL (ref 150–400)
RBC: 4.47 MIL/uL (ref 3.87–5.11)
RDW: 14.2 % (ref 11.5–15.5)
WBC: 7 10*3/uL (ref 4.0–10.5)
nRBC: 0 % (ref 0.0–0.2)

## 2022-11-21 LAB — BASIC METABOLIC PANEL
Anion gap: 12 (ref 5–15)
BUN: 13 mg/dL (ref 8–23)
CO2: 24 mmol/L (ref 22–32)
Calcium: 9.5 mg/dL (ref 8.9–10.3)
Chloride: 104 mmol/L (ref 98–111)
Creatinine, Ser: 0.83 mg/dL (ref 0.44–1.00)
GFR, Estimated: >60 mL/min (ref >60)
Glucose, Bld: 111 mg/dL — ABNORMAL HIGH (ref 70–99)
Potassium: 3.2 mmol/L — ABNORMAL LOW (ref 3.5–5.1)
Sodium: 140 mmol/L (ref 135–145)

## 2022-11-21 LAB — TROPONIN I (HIGH SENSITIVITY): Troponin I (High Sensitivity): 7 ng/L (ref <18)

## 2022-11-21 NOTE — ED Triage Notes (Signed)
Pt brought in via ems from home.  Pt reports sob.  Pt is on 2liters oxygen at home.  Hx copd.  Pt has pain beneath left breast.  Pt alert  speech clear.  Pt had 1 albuterol treatment en route.

## 2022-11-22 ENCOUNTER — Emergency Department: Payer: Medicare HMO

## 2022-11-22 ENCOUNTER — Inpatient Hospital Stay
Admission: EM | Admit: 2022-11-22 | Discharge: 2022-11-23 | DRG: 191 | Disposition: A | Discharge status: Home / Self Care | Payer: Medicare HMO | Attending: Obstetrics and Gynecology | Admitting: Obstetrics and Gynecology

## 2022-11-22 ENCOUNTER — Encounter: Payer: Self-pay | Admitting: Radiology

## 2022-11-22 DIAGNOSIS — E876 Hypokalemia: Secondary | ICD-10-CM

## 2022-11-22 DIAGNOSIS — E785 Hyperlipidemia, unspecified: Secondary | ICD-10-CM | POA: Diagnosis present

## 2022-11-22 DIAGNOSIS — I10 Essential (primary) hypertension: Secondary | ICD-10-CM | POA: Diagnosis present

## 2022-11-22 DIAGNOSIS — R000 Tachycardia, unspecified: Secondary | ICD-10-CM | POA: Diagnosis present

## 2022-11-22 DIAGNOSIS — R0602 Shortness of breath: Secondary | ICD-10-CM | POA: Diagnosis present

## 2022-11-22 DIAGNOSIS — Z9841 Cataract extraction status, right eye: Secondary | ICD-10-CM | POA: Diagnosis not present

## 2022-11-22 DIAGNOSIS — Z9842 Cataract extraction status, left eye: Secondary | ICD-10-CM | POA: Diagnosis not present

## 2022-11-22 DIAGNOSIS — Z8249 Family history of ischemic heart disease and other diseases of the circulatory system: Secondary | ICD-10-CM | POA: Diagnosis not present

## 2022-11-22 DIAGNOSIS — J441 Chronic obstructive pulmonary disease with (acute) exacerbation: Secondary | ICD-10-CM | POA: Diagnosis present

## 2022-11-22 DIAGNOSIS — F17210 Nicotine dependence, cigarettes, uncomplicated: Secondary | ICD-10-CM | POA: Diagnosis present

## 2022-11-22 DIAGNOSIS — Z8619 Personal history of other infectious and parasitic diseases: Secondary | ICD-10-CM | POA: Diagnosis not present

## 2022-11-22 DIAGNOSIS — Z780 Asymptomatic menopausal state: Secondary | ICD-10-CM | POA: Diagnosis not present

## 2022-11-22 DIAGNOSIS — F200 Paranoid schizophrenia: Secondary | ICD-10-CM | POA: Diagnosis present

## 2022-11-22 DIAGNOSIS — F319 Bipolar disorder, unspecified: Secondary | ICD-10-CM | POA: Diagnosis present

## 2022-11-22 DIAGNOSIS — J40 Bronchitis, not specified as acute or chronic: Secondary | ICD-10-CM

## 2022-11-22 DIAGNOSIS — M170 Bilateral primary osteoarthritis of knee: Secondary | ICD-10-CM | POA: Diagnosis present

## 2022-11-22 DIAGNOSIS — Z79899 Other long term (current) drug therapy: Secondary | ICD-10-CM | POA: Diagnosis not present

## 2022-11-22 DIAGNOSIS — Z20822 Contact with and (suspected) exposure to covid-19: Secondary | ICD-10-CM | POA: Diagnosis present

## 2022-11-22 DIAGNOSIS — Z791 Long term (current) use of non-steroidal anti-inflammatories (NSAID): Secondary | ICD-10-CM | POA: Diagnosis not present

## 2022-11-22 DIAGNOSIS — Z7951 Long term (current) use of inhaled steroids: Secondary | ICD-10-CM | POA: Diagnosis not present

## 2022-11-22 DIAGNOSIS — Z961 Presence of intraocular lens: Secondary | ICD-10-CM | POA: Diagnosis present

## 2022-11-22 DIAGNOSIS — K219 Gastro-esophageal reflux disease without esophagitis: Secondary | ICD-10-CM | POA: Diagnosis present

## 2022-11-22 LAB — EXPECTORATED SPUTUM ASSESSMENT W GRAM STAIN, RFLX TO RESP C

## 2022-11-22 LAB — RESP PANEL BY RT-PCR (RSV, FLU A&B, COVID)  RVPGX2
Influenza A by PCR: NEGATIVE
Influenza B by PCR: NEGATIVE
Resp Syncytial Virus by PCR: NEGATIVE
SARS Coronavirus 2 by RT PCR: NEGATIVE

## 2022-11-22 LAB — HIV ANTIBODY (ROUTINE TESTING W REFLEX): HIV Screen 4th Generation wRfx: NONREACTIVE

## 2022-11-22 LAB — CREATININE, SERUM
Creatinine, Ser: 0.56 mg/dL (ref 0.44–1.00)
GFR, Estimated: >60 mL/min (ref >60)

## 2022-11-22 LAB — CBC
HCT: 38.2 % (ref 36.0–46.0)
Hemoglobin: 12.7 g/dL (ref 12.0–15.0)
MCH: 29.2 pg (ref 26.0–34.0)
MCHC: 33.2 g/dL (ref 30.0–36.0)
MCV: 87.8 fL (ref 80.0–100.0)
Platelets: 296 10*3/uL (ref 150–400)
RBC: 4.35 MIL/uL (ref 3.87–5.11)
RDW: 13.8 % (ref 11.5–15.5)
WBC: 3.8 10*3/uL — ABNORMAL LOW (ref 4.0–10.5)
nRBC: 0 % (ref 0.0–0.2)

## 2022-11-22 LAB — TROPONIN I (HIGH SENSITIVITY): Troponin I (High Sensitivity): 6 ng/L (ref <18)

## 2022-11-22 MED ORDER — IRBESARTAN 150 MG PO TABS
150.0000 mg | ORAL_TABLET | Freq: Every day | ORAL | Status: DC
  Administered 2022-11-22 – 2022-11-23 (×2): 150 mg via ORAL
  Filled 2022-11-22 (×2): qty 1

## 2022-11-22 MED ORDER — SODIUM CHLORIDE 0.9 % IV SOLN
2.0000 g | Freq: Three times a day (TID) | INTRAVENOUS | Status: DC
  Administered 2022-11-22 – 2022-11-23 (×4): 2 g via INTRAVENOUS
  Filled 2022-11-22 (×2): qty 12.5
  Filled 2022-11-22: qty 2
  Filled 2022-11-22 (×2): qty 12.5

## 2022-11-22 MED ORDER — ENOXAPARIN SODIUM 40 MG/0.4ML IJ SOSY
0.5000 mg/kg | PREFILLED_SYRINGE | INTRAMUSCULAR | Status: DC
  Administered 2022-11-22 – 2022-11-23 (×2): 40 mg via SUBCUTANEOUS
  Filled 2022-11-22 (×2): qty 0.4

## 2022-11-22 MED ORDER — PREDNISONE 20 MG PO TABS: 40.0000 mg | ORAL_TABLET | Freq: Every day | ORAL | Status: DC

## 2022-11-22 MED ORDER — IPRATROPIUM-ALBUTEROL 0.5-2.5 (3) MG/3ML IN SOLN
3.0000 mL | Freq: Once | RESPIRATORY_TRACT | Status: AC
  Administered 2022-11-22: 3 mL via RESPIRATORY_TRACT
  Filled 2022-11-22: qty 3

## 2022-11-22 MED ORDER — PREDNISONE 20 MG PO TABS
40.0000 mg | ORAL_TABLET | Freq: Every day | ORAL | Status: DC
  Administered 2022-11-23: 40 mg via ORAL
  Filled 2022-11-22: qty 2

## 2022-11-22 MED ORDER — ACETAMINOPHEN 650 MG RE SUPP: 650.0000 mg | Freq: Four times a day (QID) | RECTAL | Status: DC | PRN

## 2022-11-22 MED ORDER — IOHEXOL 350 MG/ML SOLN
75.0000 mL | Freq: Once | INTRAVENOUS | Status: AC | PRN
  Administered 2022-11-22: 75 mL via INTRAVENOUS

## 2022-11-22 MED ORDER — HYDROCOD POLI-CHLORPHE POLI ER 10-8 MG/5ML PO SUER
5.0000 mL | Freq: Once | ORAL | Status: AC
  Administered 2022-11-22: 5 mL via ORAL
  Filled 2022-11-22: qty 5

## 2022-11-22 MED ORDER — METHYLPREDNISOLONE SODIUM SUCC 40 MG IJ SOLR
40.0000 mg | Freq: Two times a day (BID) | INTRAMUSCULAR | Status: AC
  Administered 2022-11-22: 40 mg via INTRAVENOUS
  Filled 2022-11-22: qty 1

## 2022-11-22 MED ORDER — MONTELUKAST SODIUM 10 MG PO TABS
10.0000 mg | ORAL_TABLET | Freq: Every day | ORAL | Status: DC
  Administered 2022-11-22: 10 mg via ORAL
  Filled 2022-11-22: qty 1

## 2022-11-22 MED ORDER — ZONISAMIDE 100 MG PO CAPS
100.0000 mg | ORAL_CAPSULE | Freq: Every day | ORAL | Status: DC
  Administered 2022-11-22: 100 mg via ORAL
  Filled 2022-11-22: qty 1

## 2022-11-22 MED ORDER — METHYLPREDNISOLONE SODIUM SUCC 125 MG IJ SOLR
125.0000 mg | Freq: Once | INTRAMUSCULAR | Status: AC
  Administered 2022-11-22: 125 mg via INTRAVENOUS
  Filled 2022-11-22: qty 2

## 2022-11-22 MED ORDER — POTASSIUM CHLORIDE CRYS ER 20 MEQ PO TBCR
40.0000 meq | EXTENDED_RELEASE_TABLET | Freq: Once | ORAL | Status: AC
  Administered 2022-11-22: 40 meq via ORAL
  Filled 2022-11-22: qty 2

## 2022-11-22 MED ORDER — ALPRAZOLAM 0.5 MG PO TABS
0.5000 mg | ORAL_TABLET | Freq: Three times a day (TID) | ORAL | Status: DC | PRN
  Administered 2022-11-22 – 2022-11-23 (×3): 0.5 mg via ORAL
  Filled 2022-11-22 (×3): qty 1

## 2022-11-22 MED ORDER — ROFLUMILAST 500 MCG PO TABS
500.0000 ug | ORAL_TABLET | Freq: Every day | ORAL | Status: DC
  Administered 2022-11-22 – 2022-11-23 (×2): 500 ug via ORAL
  Filled 2022-11-22 (×2): qty 1

## 2022-11-22 MED ORDER — FUROSEMIDE 20 MG PO TABS
20.0000 mg | ORAL_TABLET | Freq: Every day | ORAL | Status: DC
  Administered 2022-11-22 – 2022-11-23 (×2): 20 mg via ORAL
  Filled 2022-11-22 (×2): qty 1

## 2022-11-22 MED ORDER — ONDANSETRON HCL 4 MG PO TABS: 4.0000 mg | ORAL_TABLET | Freq: Four times a day (QID) | ORAL | Status: DC | PRN

## 2022-11-22 MED ORDER — PANTOPRAZOLE SODIUM 40 MG PO TBEC
40.0000 mg | DELAYED_RELEASE_TABLET | Freq: Every day | ORAL | Status: DC
  Administered 2022-11-22 – 2022-11-23 (×2): 40 mg via ORAL
  Filled 2022-11-22 (×2): qty 1

## 2022-11-22 MED ORDER — GUAIFENESIN-DM 100-10 MG/5ML PO SYRP
5.0000 mL | ORAL_SOLUTION | ORAL | Status: DC | PRN
  Administered 2022-11-22: 5 mL via ORAL
  Filled 2022-11-22: qty 10

## 2022-11-22 MED ORDER — ALBUTEROL SULFATE (2.5 MG/3ML) 0.083% IN NEBU: 2.5000 mg | INHALATION_SOLUTION | RESPIRATORY_TRACT | Status: DC | PRN

## 2022-11-22 MED ORDER — IPRATROPIUM-ALBUTEROL 0.5-2.5 (3) MG/3ML IN SOLN
3.0000 mL | Freq: Four times a day (QID) | RESPIRATORY_TRACT | Status: DC
  Administered 2022-11-22 – 2022-11-23 (×6): 3 mL via RESPIRATORY_TRACT
  Filled 2022-11-22 (×6): qty 3

## 2022-11-22 MED ORDER — ACETAMINOPHEN 325 MG PO TABS
650.0000 mg | ORAL_TABLET | Freq: Four times a day (QID) | ORAL | Status: DC | PRN
  Administered 2022-11-22 – 2022-11-23 (×2): 650 mg via ORAL
  Filled 2022-11-22 (×3): qty 2

## 2022-11-22 MED ORDER — ONDANSETRON HCL 4 MG/2ML IJ SOLN: 4.0000 mg | Freq: Four times a day (QID) | INTRAMUSCULAR | Status: DC | PRN

## 2022-11-22 MED ORDER — METHYLPREDNISOLONE SODIUM SUCC 40 MG IJ SOLR
40.0000 mg | Freq: Two times a day (BID) | INTRAMUSCULAR | Status: DC
  Filled 2022-11-22: qty 1

## 2022-11-22 MED ORDER — GUAIFENESIN ER 600 MG PO TB12
600.0000 mg | ORAL_TABLET | Freq: Two times a day (BID) | ORAL | Status: DC
  Filled 2022-11-22: qty 1

## 2022-11-22 MED ORDER — BUPROPION HCL ER (XL) 150 MG PO TB24
150.0000 mg | ORAL_TABLET | Freq: Every day | ORAL | Status: DC
  Administered 2022-11-22 – 2022-11-23 (×2): 150 mg via ORAL
  Filled 2022-11-22 (×2): qty 1

## 2022-11-22 MED ORDER — ROSUVASTATIN CALCIUM 10 MG PO TABS
40.0000 mg | ORAL_TABLET | Freq: Every day | ORAL | Status: DC
  Administered 2022-11-22: 40 mg via ORAL
  Filled 2022-11-22: qty 4

## 2022-11-22 MED ORDER — CHLORPROMAZINE HCL 50 MG PO TABS
150.0000 mg | ORAL_TABLET | Freq: Every day | ORAL | Status: DC
  Administered 2022-11-22: 150 mg via ORAL
  Filled 2022-11-22: qty 1

## 2022-11-22 NOTE — ED Notes (Signed)
Raquel RN gave report to [employer]

## 2022-11-22 NOTE — ED Notes (Addendum)
Rounded on patient - patient on montior, denies pain at this time. Respirations even and unlabored at this time.

## 2022-11-22 NOTE — Consult Note (Signed)
Pharmacy Antibiotic Note  Krystal Lee is a 67 y.o. female admitted on 11/22/2022 with  COPD exacerbation .  Pharmacy has been consulted for Cefepime dosing.  Assessment: 67 yo F presents with severe COPD exacerbation.  Pt had recent abx (Doxy x 7 days dispensed 10/31/22).  Sputum cx ordered.  Plan: Cefpime 2 gm IV Q8H  Height: 4\' 11"  (149.9 cm) Weight: 79.4 kg (175 lb) IBW/kg (Calculated) : 43.2  Temp (24hrs), Avg:97.9 F (36.6 C), Min:97.7 F (36.5 C), Max:98.1 F (36.7 C)  Recent Labs  Lab 11/21/22 2215  WBC 7.0  CREATININE 0.83    Estimated Creatinine Clearance: 62.4 mL/min (by C-G formula based on SCr of 0.83 mg/dL).    No Known Allergies  Antimicrobials this admission: Cefepime 1/4 >>    Dose adjustments this admission: N/A  Microbiology results: 1/4 Sputum: ordered    Thank you for allowing pharmacy to be a part of this patient's care.  Alison Murray 11/22/2022 10:18 AM

## 2022-11-22 NOTE — ED Provider Notes (Signed)
[facility] Provider Note    Event Date/Time   First MD Initiated Contact with Patient 11/22/22 [phone]     (approximate)   History   Shortness of Breath   HPI  Krystal Lee is a 67 y.o. female brought to the ED via EMS from home with a chief complaint of shortness of breath.  Patient has a history of COPD on baseline 2 L nasal cannula oxygen.  Reports cough x 1 month.  Finished prednisone and antibiotic 2 weeks ago, was feeling better but now worse.  Complains of sharp pain beneath her left breast.  Denies fever/chills, abdominal pain, nausea/vomiting or dizziness.     Past Medical History   Past Medical History:  Diagnosis Date   Anxiety    Arthritis    knees   Bipolar 1 disorder (HCC)    COPD (chronic obstructive pulmonary disease) (HCC)    Depression    GERD (gastroesophageal reflux disease)    Headache    during allergy season   Hepatitis C virus    Hypertension    Post-menopausal    Schizoaffective disorder [facility])      Active Problem List   Patient Active Problem List   Diagnosis Date Noted   Schizophrenia, paranoid ([place]) 09/16/2022   Hallucinations 09/16/2022   Dyspnea 09/16/2022   Atypical chest pain 09/16/2022   Schizophrenia ([place]) 09/16/2022   Bipolar 1 disorder ([place])    Depression    Anxiety    Hypertension    Sepsis ([place])    HLD (hyperlipidemia)    GERD (gastroesophageal reflux disease)    Tobacco abuse    COPD exacerbation ([place]) 07/28/2018   Acute on chronic respiratory failure with hypoxia and hypercapnia ([place]) 09/16/2017   COPD with acute exacerbation ([place]) 09/16/2017     Past Surgical History   Past Surgical History:  Procedure Laterality Date   CATARACT EXTRACTION  2010   right    CATARACT EXTRACTION W/PHACO Left 06/09/2019   Procedure: CATARACT EXTRACTION PHACO AND INTRAOCULAR LENS PLACEMENT ([place]) COMPLICATED  LEFT;  Surgeon: Birder Robson, MD;  Location: [place];  Service: Ophthalmology;   Laterality: Left;  VISION BLUE  [place]   LOWER EXTREMITY ANGIOGRAPHY Bilateral 08/31/2013   [facility], Dr. Lucky Cowboy     Home Medications   Prior to Admission medications   Medication Sig Start Date End Date Taking? Authorizing Provider  albuterol (PROVENTIL) (2.5 MG/3ML) 0.083% nebulizer solution Take 2.5 mg by nebulization every 6 (six) hours as needed for wheezing or shortness of breath.    [provider]  albuterol (VENTOLIN HFA) 108 (90 Base) MCG/ACT inhaler Inhale 2 puffs into the lungs every 4 (four) hours as needed for shortness of breath or wheezing.    [provider]  ALPRAZolam [name]) 0.5 MG tablet Take 0.5 mg by mouth 3 (three) times daily as needed for anxiety.    [provider]  buPROPion (WELLBUTRIN XL) 150 MG 24 hr tablet Take 1 tablet (150 mg total) by mouth daily. 09/25/22   [name], DO  chlorproMAZINE (THORAZINE) 50 MG tablet Take 3 tablets (150 mg total) by mouth at bedtime. 09/24/22   [name], DO  cholecalciferol (VITAMIN D3) 25 MCG (1000 UNIT) tablet Take 1,000 Units by mouth daily.    [provider]  Fluticasone-Umeclidin-Vilant 100-62.5-25 MCG/ACT AEPB Inhale 1 puff into the lungs daily.    [provider]  furosemide (LASIX) 20 MG tablet Take 1 tablet (20 mg total) by  mouth daily. 07/10/21   Minna Merritts, MD  montelukast (SINGULAIR) 10 MG tablet Take 10 mg by mouth at bedtime.    [provider]  pantoprazole (PROTONIX) 40 MG tablet Take 1 tablet (40 mg total) by mouth daily after breakfast. 09/25/22   [name], DO  roflumilast (DALIRESP) 500 MCG TABS tablet Take 500 mcg by mouth daily.    [provider]  rosuvastatin (CRESTOR) 40 MG tablet Take 40 mg by mouth at bedtime.    [provider]  valsartan (DIOVAN) 80 MG tablet Take 1 tablet (80 mg total) by mouth daily. 07/10/21   Minna Merritts, MD  zonisamide (ZONEGRAN) 100 MG capsule Take 1 capsule (100  mg total) by mouth at bedtime. 09/24/22   [name], DO     Allergies  Patient has no known allergies.   Family History   Family History  Problem Relation Age of Onset   Breast cancer Paternal Grandmother    Bone cancer Paternal Grandmother    Pancreatic cancer Mother    Heart disease Father    Heart disease Paternal Uncle    Stroke Paternal Grandfather      Physical Exam  Triage Vital Signs: ED Triage Vitals  Enc Vitals Group     BP 11/21/22 2211 119/80     Pulse Rate 11/21/22 2211 (!) 104     Resp 11/21/22 2211 20     Temp 11/21/22 2211 97.7 F (36.5 C)     Temp Source 11/21/22 2211 Oral     SpO2 11/21/22 2211 94 %     Weight 11/21/22 2212 175 lb (79.4 kg)     Height 11/21/22 2212 4\' 11"  (1.499 m)     Head Circumference --      Peak Flow --      Pain Score 11/21/22 2212 10     Pain Loc --      Pain Edu? --      Excl. in [place]? --     Updated Vital Signs: BP 131/69 (BP Location: Left Arm)   Pulse 94   Temp 97.8 F (36.6 C) (Oral)   Resp 18   Ht 4\' 11"  (1.499 m)   Wt 79.4 kg   SpO2 99%   BMI 35.35 kg/m    General: Awake, no distress.  CV:  RRR.  Good peripheral perfusion. Resp:  Normal effort.  Diminished aeration, otherwise CTAB. Abd:  Nontender.  No distention.  Other:  Supple calves which are nontender.   ED Results / Procedures / Treatments  Labs (all labs ordered are listed, but only abnormal results are displayed) Labs Reviewed  BASIC METABOLIC PANEL - Abnormal; Notable for the following components:      Result Value   Potassium 3.2 (*)    Glucose, Bld 111 (*)    All other components within normal limits  RESP PANEL BY RT-PCR (RSV, FLU A&B, COVID)  [id number]  CBC  TROPONIN I (HIGH SENSITIVITY)  TROPONIN I (HIGH SENSITIVITY)     EKG  ED ECG REPORT I, Kashauna Celmer J, the attending physician, personally viewed and interpreted this ECG.   Date: 11/22/2022  EKG Time: 2213  Rate: 102  Rhythm: sinus tachycardia  Axis: Normal   Intervals:none  ST&T Change: Nonspecific    RADIOLOGY I have independently visualized and interpreted patient's x-ray and CT scan as well as noted the radiology interpretation:  Chest x-ray: Emphysema and bronchitic changes  CTA chest: No PE  Official radiology report(s):  CT Angio Chest PE W/Cm &/Or Wo Cm  Result Date: 11/22/2022 CLINICAL DATA:  Suspected pulmonary embolism. EXAM: CT ANGIOGRAPHY CHEST WITH CONTRAST TECHNIQUE: Multidetector CT imaging of the chest was performed using the standard protocol during bolus administration of intravenous contrast. Multiplanar CT image reconstructions and MIPs were obtained to evaluate the vascular anatomy. RADIATION DOSE REDUCTION: This exam was performed according to the departmental dose-optimization program which includes automated exposure control, adjustment of the mA and/or kV according to patient size and/or use of iterative reconstruction technique. CONTRAST:  14mL OMNIPAQUE IOHEXOL 350 MG/ML SOLN COMPARISON:  September 16, 2022 FINDINGS: Cardiovascular: There is marked severity calcification of the aortic arch, without evidence of aortic aneurysm or dissection. Satisfactory opacification of the pulmonary arteries to the segmental level. No evidence of pulmonary embolism. Normal heart size with moderate severity coronary artery calcification. There is a small, stable pericardial effusion. Mediastinum/Nodes: No enlarged mediastinal, hilar, or axillary lymph nodes. Thyroid gland, trachea, and esophagus demonstrate no significant findings. Lungs/Pleura: There is moderate to marked severity emphysematous lung disease involving predominantly the bilateral upper lobes. Mild atelectasis is seen within the posterior aspect of the left upper lobe and right lung base. There is no evidence of a pleural effusion or pneumothorax. Upper Abdomen: No acute abnormality. Musculoskeletal: A chronic fracture deformity is seen at the level of L1 vertebral body. Multilevel  degenerative changes seen throughout the thoracic spine. Review of the MIP images confirms the above findings. IMPRESSION: 1. No evidence of pulmonary embolism. 2. 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 3. Mild posterior left upper lobe and right basilar atelectasis. 4. Stable small pericardial effusion. 5. Chronic fracture deformity of the L1 vertebral body. 6. Aortic atherosclerosis. Aortic Atherosclerosis (ICD10-I70.0) and Emphysema (ICD10-J43.9). Electronically Signed   By: Virgina Norfolk M.D.   On: 11/22/2022 03:08   DG Chest Port 1 View  Result Date: 11/21/2022 CLINICAL DATA:  Shortness of breath EXAM: PORTABLE CHEST 1 VIEW COMPARISON:  09/15/2022, CT 09/16/2022 FINDINGS: Emphysema and mild bronchitic changes. No acute airspace disease or pleural effusion. Stable cardiomediastinal silhouette. No pneumothorax. Aortic atherosclerosis. Old bilateral rib fractures IMPRESSION: No active disease. Emphysema and bronchitic changes. Electronically Signed   By: Donavan Foil M.D.   On: 11/21/2022 22:41     PROCEDURES:  Critical Care performed: Yes, see critical care procedure note(s)  CRITICAL CARE Performed by: Paulette Blanch   Total critical care time: 45 minutes  Critical care time was exclusive of separately billable procedures and treating other patients.  Critical care was necessary to treat or prevent imminent or life-threatening deterioration.  Critical care was time spent personally by me on the following activities: development of treatment plan with patient and/or surrogate as well as nursing, discussions with consultants, evaluation of patient's response to treatment, examination of patient, obtaining history from patient or surrogate, ordering and performing treatments and interventions, ordering and review of laboratory studies, ordering and review of radiographic studies, pulse oximetry and re-evaluation of patient's condition.   Marland Kitchen1-3 Lead EKG  Interpretation  Performed by: Paulette Blanch, MD Authorized by: Paulette Blanch, MD     Interpretation: abnormal     ECG rate:  102   ECG rate assessment: tachycardic     Rhythm: sinus tachycardia     Ectopy: none     Conduction: normal   Comments:     Patient placed on cardiac monitor to evaluate for arrhythmias    MEDICATIONS ORDERED IN ED: Medications  ipratropium-albuterol (DUONEB) 0.5-2.5 (3) MG/3ML nebulizer  solution 3 mL (has no administration in time range)  methylPREDNISolone sodium succinate (SOLU-MEDROL) 125 mg/2 mL injection 125 mg (125 mg Intravenous Given 11/22/22 0220)  ipratropium-albuterol (DUONEB) 0.5-2.5 (3) MG/3ML nebulizer solution 3 mL (3 mLs Nebulization Given 11/22/22 0222)  chlorpheniramine-HYDROcodone (TUSSIONEX) 10-8 MG/5ML suspension 5 mL (5 mLs Oral Given 11/22/22 0222)  potassium chloride SA (KLOR-CON M) CR tablet 40 mEq (40 mEq Oral Given 11/22/22 0221)  iohexol (OMNIPAQUE) 350 MG/ML injection 75 mL (75 mLs Intravenous Contrast Given 11/22/22 0249)     IMPRESSION / MDM / ASSESSMENT AND PLAN / ED COURSE  I reviewed the triage vital signs and the nursing notes.                             67 year old female with COPD presenting with cough and shortness of breath. Differential includes, but is not limited to, viral syndrome, bronchitis including COPD exacerbation, pneumonia, reactive airway disease including asthma, CHF including exacerbation with or without pulmonary/interstitial edema, pneumothorax, ACS, thoracic trauma, and pulmonary embolism.  I have personally reviewed patient's records and note a pulmonology office visit on 09/25/2022.  Patient's presentation is most consistent with acute presentation with potential threat to life or bodily function.  The patient is on the cardiac monitor to evaluate for evidence of arrhythmia and/or significant heart rate changes.  Laboratory results demonstrate normal WBC of 7, mild hypokalemia with potassium 3.2, initial  troponin and chest x-ray unremarkable.  Will repeat troponin, obtain respiratory panel.  Given sharp pain beneath left breast, will obtain CTA chest to evaluate for PE.  Administer oral potassium replacement, 125 mg IV Solu-Medrol, DuoNeb and Tussionex for cough.  Will reassess.  Clinical Course as of 11/22/22 0338  Thu Nov 22, 2022  [id number] Aeration remains diminished.  Patient is not feeling significantly better.  CTA chest negative for PE.  Will administer another DuoNeb which will make patient's third nebulizer treatment as EMS gave her albuterol treatment prior to arrival.  Will consult hospitalist services for evaluation and admission. [JS]    Clinical Course User Index [JS] Paulette Blanch, MD     FINAL CLINICAL IMPRESSION(S) / ED DIAGNOSES   Final diagnoses:  COPD exacerbation ([place])  Bronchitis  Hypokalemia     Rx / DC Orders   ED Discharge Orders     None        Note:  This document was prepared using Dragon voice recognition software and may include unintentional dictation errors.   Paulette Blanch, MD 11/22/22 [phone]

## 2022-11-22 NOTE — Progress Notes (Signed)
Interim progress note not for billing.  I have reviewed history and physical, examined patient, reviewed labs and studies and meds. Agree with assessment and plan though note no antibiotics are ordered. Given patient's report of recent antibiotics and what appears to be severe copd will cover for pseudomonas with cefepime and will order sputum culture.

## 2022-11-22 NOTE — ED Notes (Signed)
Patient transported back from CT 

## 2022-11-22 NOTE — ED Notes (Signed)
Pt requesting home dose xanax for anxiety and cough meds medicated per Mar.   Pt voices no other needs at this time.

## 2022-11-22 NOTE — ED Notes (Signed)
Pt encouraged to give sputum sample - assisted patient to bathroom.

## 2022-11-22 NOTE — ED Notes (Signed)
Message sent to pharmacy to adjust Solumedrol times.

## 2022-11-22 NOTE — H&P (Signed)
History and Physical    Patient: Krystal Lee GUY:[id number] DOB: 29-Mar-1956 DOA: 11/22/2022 DOS: the patient was seen and examined on 11/22/2022 PCP: Armando Gang, FNP  Patient coming from: Home  Chief Complaint:  Chief Complaint  Patient presents with   Shortness of Breath    HPI: Margee Trentham is a 67 y.o. female with medical history significant for  hypertension, hyperlipidemia, COPD on 2.5 L oxygen, GERD, anxiety, bipolar, schizoaffective disorder, HCV, who presents with shortness of breath, progressively worsening over several days, with associated cough that has been ongoing for the past month not resolved after 2 courses of antibiotics and steroids.  She has no fever or chills. ED course and data Review: Mildly tachycardic at 104 but vitals otherwise unremarkable.  Respiratory viral panel negative for COVID flu and RSV.  Labs unremarkable except for potassium of 3.2.  EKG, personally viewed and interpreted showing sinus tachycardia at 102 with nonspecific ST-T wave changes.  CTA chest PE protocol with no evidence of PE but showing moderate to marked severity emphysematous lung disease among other findings as outlined below: IMPRESSION: 1. No evidence of pulmonary embolism. 2. 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 3. Mild posterior left upper lobe and right basilar atelectasis. 4. Stable small pericardial effusion. 5. Chronic fracture deformity of the L1 vertebral body. 6. Aortic atherosclerosis.  .  Patient treated with several DuoNebs methylprednisolone Tussionex but continued to have increased work of breathing.  Hospitalist thus consulted for admission.   Review of Systems: As mentioned in the history of present illness. All other systems reviewed and are negative.  Past Medical History:  Diagnosis Date   Anxiety    Arthritis    knees   Bipolar 1 disorder (HCC)    COPD (chronic obstructive pulmonary disease) (HCC)    Depression    GERD  (gastroesophageal reflux disease)    Headache    during allergy season   Hepatitis C virus    Hypertension    Post-menopausal    Schizoaffective disorder [facility])    Past Surgical History:  Procedure Laterality Date   CATARACT EXTRACTION  2010   right    CATARACT EXTRACTION W/PHACO Left 06/09/2019   Procedure: CATARACT EXTRACTION PHACO AND INTRAOCULAR LENS PLACEMENT (IOC) COMPLICATED  LEFT;  Surgeon: [name], MD;  [facility];  Service: Ophthalmology;  Laterality: Left;  VISION BLUE  [place]   LOWER EXTREMITY ANGIOGRAPHY Bilateral 08/31/2013   [facility], Dr. [name]   Social History:  reports that she has been smoking cigarettes. She has a 45.00 pack-year smoking history. She has never used smokeless tobacco. She reports that she does not currently use alcohol after a past usage of about 1.0 standard drink of alcohol per week. She reports that she does not use drugs.  No Known Allergies  Family History  Problem Relation Age of Onset   Breast cancer Paternal Grandmother    Bone cancer Paternal Grandmother    Pancreatic cancer Mother    Heart disease Father    Heart disease Paternal Uncle    Stroke Paternal Grandfather     Prior to Admission medications   Medication Sig Start Date End Date Taking? Authorizing Provider  albuterol (PROVENTIL) (2.5 MG/3ML) 0.083% nebulizer solution Take 2.5 mg by nebulization every 6 (six) hours as needed for wheezing or shortness of breath.    [provider]  albuterol (VENTOLIN HFA) 108 (90 Base) MCG/ACT inhaler Inhale 2 puffs into the lungs every 4 (four) hours as  needed for shortness of breath or wheezing.    [provider]  ALPRAZolam [name]) 0.5 MG tablet Take 0.5 mg by mouth 3 (three) times daily as needed for anxiety.    [provider]  buPROPion (WELLBUTRIN XL) 150 MG 24 hr tablet Take 1 tablet (150 mg total) by mouth daily. 09/25/22   [name], DO  chlorproMAZINE (THORAZINE) 50 MG  tablet Take 3 tablets (150 mg total) by mouth at bedtime. 09/24/22   [name], DO  cholecalciferol (VITAMIN D3) 25 MCG (1000 UNIT) tablet Take 1,000 Units by mouth daily.    [provider]  Fluticasone-Umeclidin-Vilant 100-62.5-25 MCG/ACT AEPB Inhale 1 puff into the lungs daily.    [provider]  furosemide (LASIX) 20 MG tablet Take 1 tablet (20 mg total) by mouth daily. 07/10/21   Minna Merritts, MD  montelukast (SINGULAIR) 10 MG tablet Take 10 mg by mouth at bedtime.    [provider]  pantoprazole (PROTONIX) 40 MG tablet Take 1 tablet (40 mg total) by mouth daily after breakfast. 09/25/22   [name], DO  roflumilast (DALIRESP) 500 MCG TABS tablet Take 500 mcg by mouth daily.    [provider]  rosuvastatin (CRESTOR) 40 MG tablet Take 40 mg by mouth at bedtime.    [provider]  valsartan (DIOVAN) 80 MG tablet Take 1 tablet (80 mg total) by mouth daily. 07/10/21   Minna Merritts, MD  zonisamide (ZONEGRAN) 100 MG capsule Take 1 capsule (100 mg total) by mouth at bedtime. 09/24/22   [name], DO    Physical Exam: Vitals:   11/21/22 2211 11/21/22 2212 11/22/22 0045  BP: 119/80  131/69  Pulse: (!) 104  94  Resp: 20  18  Temp: 97.7 F (36.5 C)  97.8 F (36.6 C)  TempSrc: Oral  Oral  SpO2: 94%  99%  Weight:  79.4 kg   Height:  4\' 11"  (1.499 m)    Physical Exam Vitals and nursing note reviewed.  Constitutional:      General: She is not in acute distress. HENT:     Head: Normocephalic and atraumatic.  Cardiovascular:     Rate and Rhythm: Normal rate and regular rhythm.     Heart sounds: Normal heart sounds.  Pulmonary:     Effort: Tachypnea present.     Breath sounds: Wheezing and rhonchi present.  Abdominal:     Palpations: Abdomen is soft.     Tenderness: There is no abdominal tenderness.  Neurological:     Mental Status: Mental status is at baseline.     Labs on Admission: I  have personally reviewed following labs and imaging studies  CBC: Recent Labs  Lab 11/21/22 2215  WBC 7.0  HGB 13.0  HCT 40.0  MCV 89.5  PLT 938   Basic Metabolic Panel: Recent Labs  Lab 11/21/22 2215  NA 140  K 3.2*  CL 104  CO2 24  GLUCOSE 111*  BUN 13  CREATININE 0.83  CALCIUM 9.5   GFR: Estimated Creatinine Clearance: 62.4 mL/min (by C-G formula based on SCr of 0.83 mg/dL). Liver Function Tests: No results for input(s): "AST", "ALT", "ALKPHOS", "BILITOT", "PROT", "ALBUMIN" in the last 168 hours. No results for input(s): "LIPASE", "AMYLASE" in the last 168 hours. No results for input(s): "AMMONIA" in the last 168 hours. Coagulation Profile: No results for input(s): "INR", "PROTIME" in the last 168 hours. Cardiac Enzymes: No results for input(s): "CKTOTAL", "CKMB", "CKMBINDEX", "TROPONINI"  in the last 168 hours. BNP (last 3 results) No results for input(s): "PROBNP" in the last 8760 hours. HbA1C: No results for input(s): "HGBA1C" in the last 72 hours. CBG: No results for input(s): "GLUCAP" in the last 168 hours. Lipid Profile: No results for input(s): "CHOL", "HDL", "LDLCALC", "TRIG", "CHOLHDL", "LDLDIRECT" in the last 72 hours. Thyroid Function Tests: No results for input(s): "TSH", "T4TOTAL", "FREET4", "T3FREE", "THYROIDAB" in the last 72 hours. Anemia Panel: No results for input(s): "VITAMINB12", "FOLATE", "FERRITIN", "TIBC", "IRON", "RETICCTPCT" in the last 72 hours. Urine analysis:    Component Value Date/Time   COLORURINE YELLOW (A) 09/15/2022 2124   APPEARANCEUR HAZY (A) 09/15/2022 2124   APPEARANCEUR Clear 10/29/2013 2004   LABSPEC 1.023 09/15/2022 2124   LABSPEC 1.004 10/29/2013 2004   PHURINE 5.0 09/15/2022 2124   GLUCOSEU NEGATIVE 09/15/2022 2124   GLUCOSEU Negative 10/29/2013 2004   HGBUR NEGATIVE 09/15/2022 2124   [place] NEGATIVE 09/15/2022 2124   BILIRUBINUR Negative 10/29/2013 2004   KETONESUR 5 (A) 09/15/2022 2124   PROTEINUR  NEGATIVE 09/15/2022 2124   UROBILINOGEN 1.0 11/07/2013 1053   NITRITE NEGATIVE 09/15/2022 2124   LEUKOCYTESUR NEGATIVE 09/15/2022 2124   LEUKOCYTESUR Negative 10/29/2013 2004    Radiological Exams on Admission: CT Angio Chest PE W/Cm &/Or Wo Cm  Result Date: 11/22/2022 CLINICAL DATA:  Suspected pulmonary embolism. EXAM: CT ANGIOGRAPHY CHEST WITH CONTRAST TECHNIQUE: Multidetector CT imaging of the chest was performed using the standard protocol during bolus administration of intravenous contrast. Multiplanar CT image reconstructions and MIPs were obtained to evaluate the vascular anatomy. RADIATION DOSE REDUCTION: This exam was performed according to the departmental dose-optimization program which includes automated exposure control, adjustment of the mA and/or kV according to patient size and/or use of iterative reconstruction technique. CONTRAST:  41mL OMNIPAQUE IOHEXOL 350 MG/ML SOLN COMPARISON:  September 16, 2022 FINDINGS: Cardiovascular: There is marked severity calcification of the aortic arch, without evidence of aortic aneurysm or dissection. Satisfactory opacification of the pulmonary arteries to the segmental level. No evidence of pulmonary embolism. Normal heart size with moderate severity coronary artery calcification. There is a small, stable pericardial effusion. Mediastinum/Nodes: No enlarged mediastinal, hilar, or axillary lymph nodes. Thyroid gland, trachea, and esophagus demonstrate no significant findings. Lungs/Pleura: There is moderate to marked severity emphysematous lung disease involving predominantly the bilateral upper lobes. Mild atelectasis is seen within the posterior aspect of the left upper lobe and right lung base. There is no evidence of a pleural effusion or pneumothorax. Upper Abdomen: No acute abnormality. Musculoskeletal: A chronic fracture deformity is seen at the level of L1 vertebral body. Multilevel degenerative changes seen throughout the thoracic spine. Review of  the MIP images confirms the above findings. IMPRESSION: 1. No evidence of pulmonary embolism. 2. 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 3. Mild posterior left upper lobe and right basilar atelectasis. 4. Stable small pericardial effusion. 5. Chronic fracture deformity of the L1 vertebral body. 6. Aortic atherosclerosis. Aortic Atherosclerosis (ICD10-I70.0) and Emphysema (ICD10-J43.9). Electronically Signed   By: Virgina Norfolk M.D.   On: 11/22/2022 03:08   DG Chest Port 1 View  Result Date: 11/21/2022 CLINICAL DATA:  Shortness of breath EXAM: PORTABLE CHEST 1 VIEW COMPARISON:  09/15/2022, CT 09/16/2022 FINDINGS: Emphysema and mild bronchitic changes. No acute airspace disease or pleural effusion. Stable cardiomediastinal silhouette. No pneumothorax. Aortic atherosclerosis. Old bilateral rib fractures IMPRESSION: No active disease. Emphysema and bronchitic changes. Electronically Signed   By: Donavan Foil M.D.   On: 11/21/2022 22:41  Data Reviewed: Relevant notes from primary care and specialist visits, past discharge summaries as available in EHR, including Care Everywhere. Prior diagnostic testing as pertinent to current admission diagnoses Updated medications and problem lists for reconciliation ED course, including vitals, labs, imaging, treatment and response to treatment Triage notes, nursing and pharmacy notes and ED provider's notes Notable results as noted in HPI   Assessment and Plan:   COPD exacerbation [facility]): Currently patient has oxygen saturation 95% on home 2.5 L of oxygen.   -Chest x-ray negative for infiltration. -Bronchodilators -singular -Solu-Medrol 40 mg IV bid -Z pak  -Mucinex for cough  -Incentive spirometry -Follow up sputum culture -Nasal cannula oxygen as needed to maintain O2 saturation 93% or greater    Bipolar 1 disorder, depression and anxiety -Continue home medications   Hypertension:  -Continue Lasix, Coreg and valsartan   HLD  (hyperlipidemia) -crestor   GERD (gastroesophageal reflux disease) -Protonix   Tobacco abuse -Did counseling about importance of quitting smoking -Nicotine patch       DVT prophylaxis: Lovenox  Consults: none  Advance Care Planning:   Code Status: Prior   Family Communication: none  Disposition Plan: Back to previous home environment  Severity of Illness: The appropriate patient status for this patient is INPATIENT. Inpatient status is judged to be reasonable and necessary in order to provide the required intensity of service to ensure the patient's safety. The patient's presenting symptoms, physical exam findings, and initial radiographic and laboratory data in the context of their chronic comorbidities is felt to place them at high risk for further clinical deterioration. Furthermore, it is not anticipated that the patient will be medically stable for discharge from the hospital within 2 midnights of admission.   * I certify that at the point of admission it is my clinical judgment that the patient will require inpatient hospital care spanning beyond 2 midnights from the point of admission due to high intensity of service, high risk for further deterioration and high frequency of surveillance required.*  Author: Andris Baumann, MD 11/22/2022 3:47 AM  For on call review [email or web address].

## 2022-11-22 NOTE — ED Notes (Signed)
ED Provider at bedside. 

## 2022-11-23 DIAGNOSIS — J441 Chronic obstructive pulmonary disease with (acute) exacerbation: Secondary | ICD-10-CM | POA: Diagnosis not present

## 2022-11-23 LAB — BASIC METABOLIC PANEL
Anion gap: 6 (ref 5–15)
BUN: 16 mg/dL (ref 8–23)
CO2: 23 mmol/L (ref 22–32)
Calcium: 9.3 mg/dL (ref 8.9–10.3)
Chloride: 112 mmol/L — ABNORMAL HIGH (ref 98–111)
Creatinine, Ser: 0.59 mg/dL (ref 0.44–1.00)
GFR, Estimated: >60 mL/min (ref >60)
Glucose, Bld: 130 mg/dL — ABNORMAL HIGH (ref 70–99)
Potassium: 3.7 mmol/L (ref 3.5–5.1)
Sodium: 141 mmol/L (ref 135–145)

## 2022-11-23 MED ORDER — PREDNISONE 10 MG PO TABS: ORAL_TABLET | ORAL | 0 refills | Status: DC

## 2022-11-23 MED ORDER — LEVOFLOXACIN 750 MG PO TABS: 750.0000 mg | ORAL_TABLET | Freq: Every day | ORAL | 0 refills | Status: AC

## 2022-11-23 NOTE — Evaluation (Signed)
Physical Therapy Evaluation & Discharge Patient Details Name: Krystal Lee MRN: [id number] DOB: 06/28/1956 Today's Date: 11/23/2022  History of Present Illness  67 y/o female presented to ED on 11/21/22 for SOB. Admitted for COPD exacerbation. PMH: HTN, COPD on 2L O2, anxiety, bipolar, schizoaffective disorder  Clinical Impression  Patient admitted with the above. PTA, patient lives alone and was independent with mobility. Currently, functioning at modI level with no AD. Ambulated on RA with spO2 92-93%. Utilizes 2L O2 intermittently at home but has been requiring during the day recently. Patient seems at baseline for mobility. Encouraged continued mobility with nursing staff and mobility specialists for improving activity tolerance. No further skilled PT needs identified. No PT follow up recommended at this time. PT will sign off.        Recommendations for follow up therapy are one component of a multi-disciplinary discharge planning process, led by the attending physician.  Recommendations may be updated based on patient status, additional functional criteria and insurance authorization.  Follow Up Recommendations No PT follow up      Assistance Recommended at Discharge PRN  Patient can return home with the following       Equipment Recommendations None recommended by PT  Recommendations for Other Services       Functional Status Assessment Patient has not had a recent decline in their functional status     Precautions / Restrictions Precautions Precautions: None Restrictions Weight Bearing Restrictions: No      Mobility  Bed Mobility Overal bed mobility: Modified Independent                  Transfers Overall transfer level: Modified independent Equipment used: None                    Ambulation/Gait Ambulation/Gait assistance: Modified independent (Device/Increase time) Gait Distance (Feet): 180 Feet Assistive device: IV Pole Gait  Pattern/deviations: WFL(Within Functional Limits) Gait velocity: decreased     General Gait Details: ambulated on RA with spO2 92-93% throughout  Stairs            Wheelchair Mobility    Modified Rankin (Stroke Patients Only)       Balance Overall balance assessment: Mild deficits observed, not formally tested                                           Pertinent Vitals/Pain Pain Assessment Pain Assessment: No/denies pain    Home Living Family/patient expects to be discharged to:: Private residence Living Arrangements: Alone Available Help at Discharge: Family Type of Home: House Home Access: Stairs to enter Entrance Stairs-Rails: None Entrance Stairs-Number of Steps: 3   Home Layout: One level Home Equipment: None      Prior Function Prior Level of Function : Independent/Modified Independent             Mobility Comments: independent with no AD. recently has been using O2 during the day where she was only utilizing at night       Hand Dominance        Extremity/Trunk Assessment   Upper Extremity Assessment Upper Extremity Assessment: Overall WFL for tasks assessed    Lower Extremity Assessment Lower Extremity Assessment: Overall WFL for tasks assessed    Cervical / Trunk Assessment Cervical / Trunk Assessment: Normal  Communication   Communication: No difficulties  Cognition Arousal/Alertness: Awake/alert Behavior During  Therapy: WFL for tasks assessed/performed Overall Cognitive Status: Within Functional Limits for tasks assessed                                          General Comments General comments (skin integrity, edema, etc.): On 2L on arrival, spO2 98%. Ambulated on RA with spO2 92-93% throughout    Exercises     Assessment/Plan    PT Assessment Patient does not need any further PT services  PT Problem List         PT Treatment Interventions      PT Goals (Current goals can be found  in the Care Plan section)  Acute Rehab PT Goals Patient Stated Goal: to breathe better PT Goal Formulation: All assessment and education complete, DC therapy    Frequency       Co-evaluation               AM-PAC PT "6 Clicks" Mobility  Outcome Measure Help needed turning from your back to your side while in a flat bed without using bedrails?: None Help needed moving from lying on your back to sitting on the side of a flat bed without using bedrails?: None Help needed moving to and from a bed to a chair (including a wheelchair)?: None Help needed standing up from a chair using your arms (e.g., wheelchair or bedside chair)?: None Help needed to walk in hospital room?: None Help needed climbing 3-5 steps with a railing? : None 6 Click Score: 24    End of Session Equipment Utilized During Treatment: Oxygen Activity Tolerance: Patient tolerated treatment well Patient left: in bed;with call bell/phone within reach Nurse Communication: Mobility status PT Visit Diagnosis: Muscle weakness (generalized) (M62.81)    Time: 1062-6948 PT Time Calculation (min) (ACUTE ONLY): 20 min   Charges:   PT Evaluation $PT Eval Low Complexity: 1 Low          Antanisha Mohs A. Gilford Rile PT, DPT [facility] - Acute Rehabilitation Services   Apolonio Cutting A Ajai Terhaar 11/23/2022, 1:52 PM

## 2022-11-23 NOTE — Plan of Care (Signed)
  Problem: Education: Goal: Individualized Educational Video(s) Outcome: Progressing   Problem: Activity: Goal: Ability to tolerate increased activity will improve Outcome: Progressing   Problem: Activity: Goal: Will verbalize the importance of balancing activity with adequate rest periods Outcome: Progressing

## 2022-11-23 NOTE — Discharge Summary (Signed)
Krystal Lee ATF:[id number] DOB: 20-Nov-1955 DOA: 11/22/2022  PCP: Armando Gang, FNP  Admit date: 11/22/2022 Discharge date: 11/23/2022  Time spent: 35 minutes  Recommendations for Outpatient Follow-up:  Pcp f/u Pulmonology f/u     Discharge Diagnoses:  Principal Problem:   COPD with acute exacerbation [facility]) Active Problems:   Bipolar 1 disorder (HCC)   Hypertension   Schizophrenia, paranoid (HCC)   Discharge Condition: improved  Diet recommendation: heart healthy  Filed Weights   11/21/22 2212 11/22/22 1044  Weight: 79.4 kg 80.4 kg    History of present illness:  From admission h and p rudy Krystal Lee is a 67 y.o. female with medical history significant for  hypertension, hyperlipidemia, COPD on 2.5 L oxygen, GERD, anxiety, bipolar, schizoaffective disorder, HCV, who presents with shortness of breath, progressively worsening over several days, with associated cough that has been ongoing for the past month not resolved after 2 courses of antibiotics and steroids.  She has no fever or chills.   Hospital Course:  Patient presents with copd exacerbation. Had been treated with course of abx and steroids 2 weeks prior, improved somewhat, now with worsening symptoms. Is on intermittent o2 at home. Here was treated with cefepime and glucocorticoids. Symptomatically improved and ambulated well with PT, no hypoxemia with ambulation off of nasal cannula O2. CTA negative for PE or infection. Covid/flu/rsv negative. Sputum culture growing gram negative rods, will f/u results and rx levofloxacin to complete a course. Also prescribing prednisone taper. Advised pulmonology f/u and smoking cessation.  Procedures: none   Consultations: none  Discharge Exam: Vitals:   11/23/22 0402 11/23/22 0917  BP: (!) 149/75 115/86  Pulse: 83 (!) 102  Resp: 20 17  Temp: 97.6 F (36.4 C) 97.7 F (36.5 C)  SpO2: 95% 97%    General: NAD Cardiovascular: RRR Respiratory: few scattered  rhonchi  Discharge Instructions   Discharge Instructions     Diet - low sodium heart healthy   Complete by: As directed    Increase activity slowly   Complete by: As directed       Allergies as of 11/23/2022   No Known Allergies      Medication List     TAKE these medications    albuterol (2.5 MG/3ML) 0.083% nebulizer solution Commonly known as: PROVENTIL Take 2.5 mg by nebulization every 6 (six) hours as needed for wheezing or shortness of breath.   albuterol 108 (90 Base) MCG/ACT inhaler Commonly known as: VENTOLIN HFA Inhale 2 puffs into the lungs every 4 (four) hours as needed for shortness of breath or wheezing.   ALPRAZolam 0.5 MG tablet Commonly known as: XANAX Take 0.5 mg by mouth 3 (three) times daily as needed for anxiety.   buPROPion 150 MG 24 hr tablet Commonly known as: WELLBUTRIN XL Take 1 tablet (150 mg total) by mouth daily.   chlorproMAZINE 50 MG tablet Commonly known as: THORAZINE Take 3 tablets (150 mg total) by mouth at bedtime.   cholecalciferol 25 MCG (1000 UNIT) tablet Commonly known as: VITAMIN D3 Take 1,000 Units by mouth daily.   Fluticasone-Umeclidin-Vilant 100-62.5-25 MCG/ACT Aepb Inhale 1 puff into the lungs daily.   furosemide 20 MG tablet Commonly known as: LASIX Take 1 tablet (20 mg total) by mouth daily.   levofloxacin 750 MG tablet Commonly known as: Levaquin Take 1 tablet (750 mg total) by mouth daily for 4 days.   montelukast 10 MG tablet Commonly known as: SINGULAIR Take 10 mg by mouth at bedtime.  pantoprazole 40 MG tablet Commonly known as: PROTONIX Take 1 tablet (40 mg total) by mouth daily after breakfast.   predniSONE 10 MG tablet Commonly known as: DELTASONE 4 tabs daily for 3 days, then 3 tabs daily for 3 days, then 2 tabs daily for 3 days, then 1 tab daily   roflumilast 500 MCG Tabs tablet Commonly known as: DALIRESP Take 500 mcg by mouth daily.   rosuvastatin 40 MG tablet Commonly known as:  CRESTOR Take 40 mg by mouth at bedtime.   valsartan 80 MG tablet Commonly known as: DIOVAN Take 1 tablet (80 mg total) by mouth daily.   zonisamide 100 MG capsule Commonly known as: ZONEGRAN Take 1 capsule (100 mg total) by mouth at bedtime.       No Known Allergies  Follow-up Information     Armando Gang, FNP Follow up.   Specialty: Family Medicine Contact information: [address] [place] [address] [phone]         Salena Saner, MD Follow up.   Specialty: Pulmonary Disease Contact information: [address] [place] [address] [phone]                  The results of significant diagnostics from this hospitalization (including imaging, microbiology, ancillary and laboratory) are listed below for reference.    Significant Diagnostic Studies: CT Angio Chest PE W/Cm &/Or Wo Cm  Result Date: 11/22/2022 CLINICAL DATA:  Suspected pulmonary embolism. EXAM: CT ANGIOGRAPHY CHEST WITH CONTRAST TECHNIQUE: Multidetector CT imaging of the chest was performed using the standard protocol during bolus administration of intravenous contrast. Multiplanar CT image reconstructions and MIPs were obtained to evaluate the vascular anatomy. RADIATION DOSE REDUCTION: This exam was performed according to the departmental dose-optimization program which includes automated exposure control, adjustment of the mA and/or kV according to patient size and/or use of iterative reconstruction technique. CONTRAST:  52mL OMNIPAQUE IOHEXOL 350 MG/ML SOLN COMPARISON:  September 16, 2022 FINDINGS: Cardiovascular: There is marked severity calcification of the aortic arch, without evidence of aortic aneurysm or dissection. Satisfactory opacification of the pulmonary arteries to the segmental level. No evidence of pulmonary embolism. Normal heart size with moderate severity coronary artery calcification. There is a small, stable pericardial effusion.  Mediastinum/Nodes: No enlarged mediastinal, hilar, or axillary lymph nodes. Thyroid gland, trachea, and esophagus demonstrate no significant findings. Lungs/Pleura: There is moderate to marked severity emphysematous lung disease involving predominantly the bilateral upper lobes. Mild atelectasis is seen within the posterior aspect of the left upper lobe and right lung base. There is no evidence of a pleural effusion or pneumothorax. Upper Abdomen: No acute abnormality. Musculoskeletal: A chronic fracture deformity is seen at the level of L1 vertebral body. Multilevel degenerative changes seen throughout the thoracic spine. Review of the MIP images confirms the above findings. IMPRESSION: 1. No evidence of pulmonary embolism. 2. 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 3. Mild posterior left upper lobe and right basilar atelectasis. 4. Stable small pericardial effusion. 5. Chronic fracture deformity of the L1 vertebral body. 6. Aortic atherosclerosis. Aortic Atherosclerosis (ICD10-I70.0) and Emphysema (ICD10-J43.9). Electronically Signed   By: Aram Candela M.D.   On: 11/22/2022 03:08   DG Chest Port 1 View  Result Date: 11/21/2022 CLINICAL DATA:  Shortness of breath EXAM: PORTABLE CHEST 1 VIEW COMPARISON:  09/15/2022, CT 09/16/2022 FINDINGS: Emphysema and mild bronchitic changes. No acute airspace disease or pleural effusion. Stable cardiomediastinal silhouette. No pneumothorax. Aortic atherosclerosis. Old bilateral rib fractures IMPRESSION:  No active disease. Emphysema and bronchitic changes. Electronically Signed   By: Donavan Foil M.D.   On: 11/21/2022 22:41    Microbiology: Recent Results (from the past 240 hour(s))  Resp panel by RT-PCR (RSV, Flu A&B, Covid) Anterior Nasal Swab     Status: None   Collection Time: 11/22/22  2:24 AM   Specimen: Anterior Nasal Swab  Result Value Ref Range Status   SARS Coronavirus 2 by RT PCR NEGATIVE NEGATIVE Final    Comment: (NOTE) SARS-CoV-2  target nucleic acids are NOT DETECTED.  The SARS-CoV-2 RNA is generally detectable in upper respiratory specimens during the acute phase of infection. The lowest concentration of SARS-CoV-2 viral copies this assay can detect is 138 copies/mL. A negative result does not preclude SARS-Cov-2 infection and should not be used as the sole basis for treatment or other patient management decisions. A negative result may occur with  improper specimen collection/handling, submission of specimen other than nasopharyngeal swab, presence of viral mutation(s) within the areas targeted by this assay, and inadequate number of viral copies(<138 copies/mL). A negative result must be combined with clinical observations, patient history, and epidemiological information. The expected result is Negative.  Fact Sheet for Patients:  EntrepreneurPulse.com.au  Fact Sheet for Healthcare Providers:  IncredibleEmployment.be  This test is no t yet approved or cleared by the [place] FDA and  has been authorized for detection and/or diagnosis of SARS-CoV-2 by FDA under an Emergency Use Authorization (EUA). This EUA will remain  in effect (meaning this test can be used) for the duration of the COVID-19 declaration under Section 564(b)(1) of the Act, 21 U.S.C.section 360bbb-3(b)(1), unless the authorization is terminated  or revoked sooner.       Influenza A by PCR NEGATIVE NEGATIVE Final   Influenza B by PCR NEGATIVE NEGATIVE Final    Comment: (NOTE) The Xpert Xpress SARS-CoV-2/FLU/RSV plus assay is intended as an aid in the diagnosis of influenza from Nasopharyngeal swab specimens and should not be used as a sole basis for treatment. Nasal washings and aspirates are unacceptable for Xpert Xpress SARS-CoV-2/FLU/RSV testing.  Fact Sheet for Patients: EntrepreneurPulse.com.au  Fact Sheet for Healthcare  Providers: IncredibleEmployment.be  This test is not yet approved or cleared by the [place] FDA and has been authorized for detection and/or diagnosis of SARS-CoV-2 by FDA under an Emergency Use Authorization (EUA). This EUA will remain in effect (meaning this test can be used) for the duration of the COVID-19 declaration under Section 564(b)(1) of the Act, 21 U.S.C. section 360bbb-3(b)(1), unless the authorization is terminated or revoked.     Resp Syncytial Virus by PCR NEGATIVE NEGATIVE Final    Comment: (NOTE) Fact Sheet for Patients: EntrepreneurPulse.com.au  Fact Sheet for Healthcare Providers: IncredibleEmployment.be  This test is not yet approved or cleared by the [place] FDA and has been authorized for detection and/or diagnosis of SARS-CoV-2 by FDA under an Emergency Use Authorization (EUA). This EUA will remain in effect (meaning this test can be used) for the duration of the COVID-19 declaration under Section 564(b)(1) of the Act, 21 U.S.C. section 360bbb-3(b)(1), unless the authorization is terminated or revoked.  Performed at [facility], [address] [place], [place] [address]   Expectorated Sputum Assessment w Gram Stain, Rflx to Resp Cult     Status: None   Collection Time: 11/22/22 12:34 PM   Specimen: Expectorated Sputum  Result Value Ref Range Status   Specimen Description EXPECTORATED SPUTUM  Final   Special Requests NONE  Final   Sputum evaluation   Final    THIS SPECIMEN IS ACCEPTABLE FOR SPUTUM CULTURE Performed at [facility], [address] [place], [place] [address]    Report Status 11/22/2022 FINAL  Final  Culture, Respiratory w Gram Stain     Status: None (Preliminary result)   Collection Time: 11/22/22 12:34 PM  Result Value Ref Range Status   Specimen Description   Final    EXPECTORATED SPUTUM Performed at [facility], [address] [place], [place] [address]    Special Requests   Final    NONE Reflexed from [phone] Performed at [place], [place] [address]    Gram Stain   Final    ABUNDANT SQUAMOUS EPITHELIAL CELLS PRESENT RARE BUDDING YEAST SEEN FEW GRAM POSITIVE COCCI IN PAIRS FEW GRAM NEGATIVE RODS    Culture   Final    CULTURE REINCUBATED FOR BETTER GROWTH Performed at [facility], [place] [address], [place], [place] [address]    Report Status PENDING  Incomplete     Labs: Basic Metabolic Panel: Recent Labs  Lab 11/21/22 2215 11/22/22 1454 11/23/22 0650  NA 140  --  141  K 3.2*  --  3.7  CL 104  --  112*  CO2 24  --  23  GLUCOSE 111*  --  130*  BUN 13  --  16  CREATININE 0.83 0.56 0.59  CALCIUM 9.5  --  9.3   Liver Function Tests: No results for input(s): "AST", "ALT", "ALKPHOS", "BILITOT", "PROT", "ALBUMIN" in the last 168 hours. No results for input(s): "LIPASE", "AMYLASE" in the last 168 hours. No results for input(s): "AMMONIA" in the last 168 hours. CBC: Recent Labs  Lab 11/21/22 2215 11/22/22 1454  WBC 7.0 3.8*  HGB 13.0 12.7  HCT 40.0 38.2  MCV 89.5 87.8  PLT 301 296   Cardiac Enzymes: No results for input(s): "CKTOTAL", "CKMB", "CKMBINDEX", "TROPONINI" in the last 168 hours. BNP: BNP (last 3 results) Recent Labs    09/15/22 2124  BNP 23.4    ProBNP (last 3 results) No results for input(s): "PROBNP" in the last 8760 hours.  CBG: No results for input(s): "GLUCAP" in the last 168 hours.     Signed:  Desma Maxim MD.  [facility] 11/23/2022, 12:44 PM

## 2022-11-23 NOTE — Consult Note (Signed)
Pharmacy Antibiotic Note  Krystal Lee is a 67 y.o. female admitted on 11/22/2022 with  COPD exacerbation .  Pharmacy has been consulted for Cefepime dosing.  Assessment: 67 yo F presents with severe COPD exacerbation.  Pt had recent abx (Doxy x 7 days dispensed 10/31/22).  Sputum cx ordered.  Plan: Continue Cefpime 2 gm IV Q8H  Height: 4\' 11"  (149.9 cm) Weight: 80.4 kg (177 lb 4.8 oz) IBW/kg (Calculated) : 43.2  Temp (24hrs), Avg:97.6 F (36.4 C), Min:97.5 F (36.4 C), Max:97.7 F (36.5 C)  Recent Labs  Lab 11/21/22 2215 11/22/22 1454 11/23/22 0650  WBC 7.0 3.8*  --   CREATININE 0.83 0.56 0.59     Estimated Creatinine Clearance: 65.2 mL/min (by C-G formula based on SCr of 0.59 mg/dL).    No Known Allergies  Antimicrobials this admission: Cefepime 1/4 >>    Dose adjustments this admission: N/A  Microbiology results: 1/4 Sputum: pending   Thank you for allowing pharmacy to be a part of this patient's care.  Lorin Picket, PharmD 11/23/2022 9:05 AM

## 2022-11-23 NOTE — Progress Notes (Signed)
Mobility Specialist - Progress Note    11/23/22 1414  Mobility  Activity Ambulated independently in room  Level of Assistance Independent  Assistive Device None  Distance Ambulated (ft) 20 ft  Activity Response Tolerated well  $Mobility charge 1 Mobility   Pt ambulating in room upon entry, utilizing RA. Pt ambulated around the room without AD, tolerated well. Pt left standing near bathroom with needs within, no complaints.   Candie Mile Mobility Specialist 11/23/22 2:18 PM

## 2022-11-24 LAB — CULTURE, RESPIRATORY W GRAM STAIN: Culture: NORMAL

## 2022-12-19 ENCOUNTER — Other Ambulatory Visit: Payer: Medicare HMO

## 2023-01-24 ENCOUNTER — Ambulatory Visit: Payer: Medicare HMO | Attending: Family Medicine

## 2023-03-04 NOTE — Progress Notes (Unsigned)
Cardiology Office Note  Date:  03/05/2023   ID:  Krystal Lee, Krystal Lee February 07, 1956, MRN [id number]  PCP:  Armando Gang, FNP   Chief Complaint  Patient presents with   6 month follow up     Patient c/o shortness of breath and chest pain today. Medications reviewed by the patient verbally.     HPI:  [name] is a 67 year old woman with past medical history of Smoking COPD HTN bipolar Recent hospitalization for COPD exacerbation Who presents for f/u of her  hypertension, abnormal echocardiogram  LOV July 2023 In hospital January 2020 for COPD exacerbation In hospital 05/29/22 copd exacerbation In hospital 05/18/2021: copd exacerbation In hospital 1/23: copd exacerbation  In follow-up today reports having increasing wheezing, shortness of breath Feels she is having COPD flare, Pollen set it off, sitting for hours outside  Last flare was Jan 2024, had prednisone taper which helped  Denies significant leg edema, no chest pain concerning for angina On her visit today not on oxygen Bringing up small amounts of mucus  Reports she is not on carvedilol  Has a neb machine, On inhaler trelegy Has oxygen at home, generator  Reports she continues to smoke  Echo 05/20/2021 Normal  EF  55 to 60%  EKG personally reviewed by myself on todays visit Sinus tachycardia rate 81 no ST or T wave changes   PMH:   has a past medical history of Anxiety, Arthritis, Bipolar 1 disorder, COPD (chronic obstructive pulmonary disease), Depression, GERD (gastroesophageal reflux disease), Headache, Hepatitis C virus, Hypertension, Post-menopausal, and Schizoaffective disorder.  PSH:    Past Surgical History:  Procedure Laterality Date   CATARACT EXTRACTION  2010   right    CATARACT EXTRACTION W/PHACO Left 06/09/2019   Procedure: CATARACT EXTRACTION PHACO AND INTRAOCULAR LENS PLACEMENT (IOC) COMPLICATED  LEFT;  Surgeon: [name], MD;  Location: [facility];  Service:  Ophthalmology;  Laterality: Left;  VISION BLUE  OM[place]A   LOWER EXTREMITY ANGIOGRAPHY Bilateral 08/31/2013   [facility], Dr. [name]    Current Outpatient Medications  Medication Sig Dispense Refill   albuterol (PROVENTIL) (2.5 MG/3ML) 0.083% nebulizer solution Take 2.5 mg by nebulization every 6 (six) hours as needed for wheezing or shortness of breath.     albuterol (VENTOLIN HFA) 108 (90 Base) MCG/ACT inhaler Inhale 2 puffs into the lungs every 4 (four) hours as needed for shortness of breath or wheezing.     ALPRAZolam (XANAX) 0.5 MG tablet Take 0.5 mg by mouth 3 (three) times daily as needed for anxiety.     cholecalciferol (VITAMIN D3) 25 MCG (1000 UNIT) tablet Take 1,000 Units by mouth daily.     Fluticasone-Umeclidin-Vilant 100-62.5-25 MCG/ACT AEPB Inhale 1 puff into the lungs daily.     furosemide (LASIX) 20 MG tablet Take 1 tablet (20 mg total) by mouth daily. 30 tablet    montelukast (SINGULAIR) 10 MG tablet Take 10 mg by mouth at bedtime.     pantoprazole (PROTONIX) 40 MG tablet Take 1 tablet (40 mg total) by mouth daily after breakfast. 30 tablet 3   QUEtiapine Fumarate (SEROQUEL XR) 150 MG 24 hr tablet Take 150 mg by mouth at bedtime.     roflumilast (DALIRESP) 500 MCG TABS tablet Take 500 mcg by mouth daily.     rosuvastatin (CRESTOR) 40 MG tablet Take 40 mg by mouth at bedtime.     valsartan (DIOVAN) 80 MG tablet Take 1 tablet (80 mg total) by mouth daily. 30 tablet 0  zonisamide (ZONEGRAN) 100 MG capsule Take 1 capsule (100 mg total) by mouth at bedtime. 30 capsule 3   buPROPion (WELLBUTRIN XL) 150 MG 24 hr tablet Take 1 tablet (150 mg total) by mouth daily. (Patient not taking: Reported on 03/05/2023) 30 tablet 3   chlorproMAZINE (THORAZINE) 50 MG tablet Take 3 tablets (150 mg total) by mouth at bedtime. (Patient not taking: Reported on 03/05/2023) 90 tablet 3   predniSONE (DELTASONE) 10 MG tablet 4 tabs daily for 3 days, then 3 tabs daily for 3 days, then 2 tabs daily for 3 days, then  1 tab daily (Patient not taking: Reported on 03/05/2023) 30 tablet 0   No current facility-administered medications for this visit.     Allergies:   Patient has no known allergies.   Social History:  The patient  reports that she has been smoking cigarettes. She has a 45.00 pack-year smoking history. She has never used smokeless tobacco. She reports that she does not currently use alcohol after a past usage of about 1.0 standard drink of alcohol per week. She reports that she does not use drugs.   Family History:   family history includes Bone cancer in her paternal grandmother; Breast cancer in her paternal grandmother; Heart disease in her father and paternal uncle; Pancreatic cancer in her mother; Stroke in her paternal grandfather.    Review of Systems: Review of Systems  Constitutional: Negative.   HENT: Negative.    Respiratory:  Positive for sputum production and shortness of breath.   Cardiovascular: Negative.   Gastrointestinal: Negative.   Musculoskeletal: Negative.   Neurological: Negative.   Psychiatric/Behavioral: Negative.    All other systems reviewed and are negative.   PHYSICAL EXAM: VS:  BP 116/70 (BP Location: Left Arm, Patient Position: Sitting, Cuff Size: Normal)   Pulse 81   Ht 4\' 11"  (1.499 m)   Wt 164 lb (74.4 kg)   SpO2 97%   BMI 33.12 kg/m  , BMI Body mass index is 33.12 kg/m. Constitutional:  oriented to person, place, and time. No distress.  HENT:  Head: Grossly normal Eyes:  no discharge. No scleral icterus.  Neck: No JVD, no carotid bruits  Cardiovascular: Regular rate and rhythm, no murmurs appreciated Pulmonary/Chest: Moderately decreased breath sounds throughout, expiratory wheezing Abdominal: Soft.  no distension.  no tenderness.  Musculoskeletal: Normal range of motion Neurological:  normal muscle tone. Coordination normal. No atrophy Skin: Skin warm and dry Psychiatric: normal affect, pleasant  Recent Labs: 09/15/2022: ALT 19; B  Natriuretic Peptide 23.4 11/22/2022: Hemoglobin 12.7; Platelets 296 11/23/2022: BUN 16; Creatinine, Ser 0.59; Potassium 3.7; Sodium 141    Lipid Panel No results found for: "CHOL", "HDL", "LDLCALC", "TRIG"    Wt Readings from Last 3 Encounters:  03/05/23 164 lb (74.4 kg)  11/22/22 177 lb 4.8 oz (80.4 kg)  09/25/22 183 lb (83 kg)     ASSESSMENT AND PLAN:  Problem List Items Addressed This Visit       Cardiology Problems   Hypertension   HLD (hyperlipidemia)     Other   Bipolar 1 disorder   Schizophrenia, paranoid   Dyspnea   Atypical chest pain   Other Visit Diagnoses     Centrilobular emphysema    -  Primary     Essential hypertension Blood pressure is well controlled on today's visit. No changes made to the medications.  Diastolic CHF Ejection fraction 55 to 60% Minimal leg edema, appears euvolemic Blood pressure stable No changes to medications  Smoker/COPD Frequent hospitalizations for COPD exacerbation Reports she is having COPD flare after sitting in the pollen for several hours Does not want to go to the emergency room, requesting prednisone taper Significant wheezing on exam, lungs are very tight Do not see significant need for antibiotics given lack of sputum production, less likely acute bronchitis We will send in prednisone taper 40 mg 3 days, 30 mg 3 days, 20 mg 3 days, 10 mg 3 days Given frequent hospitalizations for COPD exacerbation, could consider long-term steroids on a daily basis.  Will defer to pulmonary Reports that she has portable oxygen, not using it today  Hyperlipidemia Continue Crestor Will treat aggressively, goal LDL less than 70   Total encounter time more than 30 minutes  Greater than 50% was spent in counseling and coordination of care with the patient    Signed, Dossie Arbour, M.D., Ph.D. [facility] [place], [place] [phone]

## 2023-03-05 ENCOUNTER — Ambulatory Visit: Payer: Medicare HMO | Attending: Cardiovascular Disease | Admitting: Cardiovascular Disease

## 2023-03-05 ENCOUNTER — Encounter: Payer: Self-pay | Admitting: Cardiovascular Disease

## 2023-03-05 VITALS — BP 116/70 | HR 81 | Ht 59.0 in | Wt 164.0 lb

## 2023-03-05 DIAGNOSIS — E782 Mixed hyperlipidemia: Secondary | ICD-10-CM | POA: Diagnosis not present

## 2023-03-05 DIAGNOSIS — I10 Essential (primary) hypertension: Secondary | ICD-10-CM | POA: Diagnosis not present

## 2023-03-05 DIAGNOSIS — F319 Bipolar disorder, unspecified: Secondary | ICD-10-CM

## 2023-03-05 DIAGNOSIS — R0789 Other chest pain: Secondary | ICD-10-CM

## 2023-03-05 DIAGNOSIS — J432 Centrilobular emphysema: Secondary | ICD-10-CM

## 2023-03-05 DIAGNOSIS — F200 Paranoid schizophrenia: Secondary | ICD-10-CM

## 2023-03-05 DIAGNOSIS — R0600 Dyspnea, unspecified: Secondary | ICD-10-CM

## 2023-03-05 MED ORDER — PREDNISONE 10 MG PO TABS: ORAL_TABLET | ORAL | 0 refills | Status: DC

## 2023-03-05 NOTE — Patient Instructions (Addendum)
Medication Instructions:  Prednisone 40 mg 3 days Then 30 mg 3 days Then 20 mg 3 days Then 10 mg 3 days  If you need a refill on your cardiac medications before your next appointment, please call your pharmacy.   Lab work: No new labs needed  Testing/Procedures: No new testing needed  Follow-Up: At [facility], you and your health needs are our priority.  As part of our continuing mission to provide you with exceptional heart care, we have created designated Provider Care Teams.  These Care Teams include your primary Cardiologist (physician) and Advanced Practice Providers (APPs -  Physician Assistants and Nurse Practitioners) who all work together to provide you with the care you need, when you need it.  You will need a follow up appointment in 12 months  Providers on your designated Care Team:   Nicolasa Ducking, NP Eula Listen, PA-C Cadence Fransico Michael, [place]  COVID-19 Vaccine Information can be found at: [email or web address] For questions related to vaccine distribution or appointments, please email [email or web address] or call [phone].

## 2023-04-16 ENCOUNTER — Encounter: Payer: Self-pay | Admitting: Pulmonary Disease

## 2023-04-22 ENCOUNTER — Telehealth: Payer: Self-pay

## 2023-04-22 NOTE — Telephone Encounter (Signed)
Left VM  for patient to call for scheduling annual LDCT 

## 2023-06-18 ENCOUNTER — Emergency Department
Admission: EM | Admit: 2023-06-18 | Discharge: 2023-06-18 | Disposition: A | Discharge status: Home / Self Care | Payer: Medicare HMO | Attending: Emergency Medicine | Admitting: Emergency Medicine

## 2023-06-18 ENCOUNTER — Emergency Department: Payer: Medicare HMO

## 2023-06-18 ENCOUNTER — Encounter: Payer: Self-pay | Admitting: Emergency Medicine

## 2023-06-18 DIAGNOSIS — R222 Localized swelling, mass and lump, trunk: Secondary | ICD-10-CM

## 2023-06-18 DIAGNOSIS — F17210 Nicotine dependence, cigarettes, uncomplicated: Secondary | ICD-10-CM | POA: Insufficient documentation

## 2023-06-18 DIAGNOSIS — D72829 Elevated white blood cell count, unspecified: Secondary | ICD-10-CM | POA: Diagnosis not present

## 2023-06-18 DIAGNOSIS — R0602 Shortness of breath: Secondary | ICD-10-CM | POA: Diagnosis present

## 2023-06-18 DIAGNOSIS — J449 Chronic obstructive pulmonary disease, unspecified: Secondary | ICD-10-CM | POA: Insufficient documentation

## 2023-06-18 DIAGNOSIS — I10 Essential (primary) hypertension: Secondary | ICD-10-CM | POA: Diagnosis not present

## 2023-06-18 DIAGNOSIS — R079 Chest pain, unspecified: Secondary | ICD-10-CM | POA: Diagnosis not present

## 2023-06-18 DIAGNOSIS — R0600 Dyspnea, unspecified: Secondary | ICD-10-CM | POA: Insufficient documentation

## 2023-06-18 DIAGNOSIS — R911 Solitary pulmonary nodule: Secondary | ICD-10-CM | POA: Insufficient documentation

## 2023-06-18 LAB — TROPONIN I (HIGH SENSITIVITY)
Troponin I (High Sensitivity): 4 ng/L (ref <18)
Troponin I (High Sensitivity): 4 ng/L (ref <18)

## 2023-06-18 LAB — CBC
HCT: 39.5 % (ref 36.0–46.0)
Hemoglobin: 13.5 g/dL (ref 12.0–15.0)
MCH: 28.9 pg (ref 26.0–34.0)
MCHC: 34.2 g/dL (ref 30.0–36.0)
MCV: 84.6 fL (ref 80.0–100.0)
Platelets: 249 10*3/uL (ref 150–400)
RBC: 4.67 MIL/uL (ref 3.87–5.11)
RDW: 14.4 % (ref 11.5–15.5)
WBC: 11.9 10*3/uL — ABNORMAL HIGH (ref 4.0–10.5)
nRBC: 0 % (ref 0.0–0.2)

## 2023-06-18 LAB — BASIC METABOLIC PANEL
Anion gap: 11 (ref 5–15)
BUN: 11 mg/dL (ref 8–23)
CO2: 16 mmol/L — ABNORMAL LOW (ref 22–32)
Calcium: 9.7 mg/dL (ref 8.9–10.3)
Chloride: 111 mmol/L (ref 98–111)
Creatinine, Ser: 0.59 mg/dL (ref 0.44–1.00)
GFR, Estimated: >60 mL/min (ref >60)
Glucose, Bld: 123 mg/dL — ABNORMAL HIGH (ref 70–99)
Potassium: 3 mmol/L — ABNORMAL LOW (ref 3.5–5.1)
Sodium: 138 mmol/L (ref 135–145)

## 2023-06-18 MED ORDER — METHYLPREDNISOLONE SODIUM SUCC 125 MG IJ SOLR
125.0000 mg | Freq: Once | INTRAMUSCULAR | Status: AC
  Administered 2023-06-18: 125 mg via INTRAVENOUS
  Filled 2023-06-18: qty 2

## 2023-06-18 MED ORDER — IOHEXOL 300 MG/ML  SOLN
75.0000 mL | Freq: Once | INTRAMUSCULAR | Status: AC | PRN
  Administered 2023-06-18: 75 mL via INTRAVENOUS

## 2023-06-18 MED ORDER — PREDNISONE 10 MG PO TABS: 10.0000 mg | ORAL_TABLET | Freq: Every day | ORAL | 0 refills | Status: DC

## 2023-06-18 MED ORDER — IPRATROPIUM-ALBUTEROL 0.5-2.5 (3) MG/3ML IN SOLN
3.0000 mL | Freq: Once | RESPIRATORY_TRACT | Status: AC
  Administered 2023-06-18: 3 mL via RESPIRATORY_TRACT
  Filled 2023-06-18: qty 3

## 2023-06-18 MED ORDER — AZITHROMYCIN 250 MG PO TABS: ORAL_TABLET | ORAL | 0 refills | Status: AC

## 2023-06-18 MED ORDER — CEPHALEXIN 500 MG PO CAPS: 500.0000 mg | ORAL_CAPSULE | Freq: Two times a day (BID) | ORAL | 0 refills | Status: DC

## 2023-06-18 MED ORDER — OXYCODONE-ACETAMINOPHEN 5-325 MG PO TABS
1.0000 | ORAL_TABLET | Freq: Once | ORAL | Status: AC
  Administered 2023-06-18: 1 via ORAL
  Filled 2023-06-18: qty 1

## 2023-06-18 NOTE — ED Triage Notes (Signed)
Pt endorses COPD exacerbation for 6 weeks that has worsened. Endorses chest tightness, 8/10, worse on left side. Last home neb at 4 today. Also been using home O2 more frequently over past 2 weeks, usually uses just at night.

## 2023-06-18 NOTE — Discharge Instructions (Signed)
We discussed you should be receiving a call to arrange appointment for follow-up for this pulmonary nodule/mass.  Please take antibiotics as prescribed for their entire course.  Please take your steroids as prescribed for their entire course.  Return to the emergency department for any return of/worsening shortness of breath chest pain or any other symptom concerning to yourself.  If you do not hear from the pulmonary nodule clinic by Thursday please call them at the number provided above

## 2023-06-18 NOTE — ED Notes (Signed)
Pt denies needs, call bell at right side.

## 2023-06-18 NOTE — ED Notes (Signed)
Report from abby, rn

## 2023-06-18 NOTE — ED Provider Notes (Signed)
[facility] Provider Note    Event Date/Time   First MD Initiated Contact with Patient 06/18/23 2138     (approximate)  History   Chief Complaint: Shortness of Breath  HPI  Krystal Lee is a 67 y.o. female with a past medical history of anxiety, arthritis, bipolar, COPD, gastric reflux, hypertension, presents to the emergency department for intermittent chest pain and shortness of breath.  According to the patient for the past several weeks she has had intermittent chest pain and shortness of breath.  Patient is a daily smoker.  Patient thinks that her COPD has worsened.  Currently satting 99 to 100% on room air throughout my evaluation.  No baseline O2 requirement.  Physical Exam   Triage Vital Signs: ED Triage Vitals  Encounter Vitals Group     BP 06/18/23 1840 139/85     Systolic BP Percentile --      Diastolic BP Percentile --      Pulse Rate 06/18/23 1840 (!) 108     Resp 06/18/23 1840 (!) 22     Temp 06/18/23 1840 97.9 F (36.6 C)     Temp Source 06/18/23 1840 Oral     SpO2 06/18/23 1840 100 %     Weight 06/18/23 1841 165 lb 5.5 oz (75 kg)     Height 06/18/23 1841 4\' 11"  (1.499 m)     Head Circumference --      Peak Flow --      Pain Score 06/18/23 1840 8     Pain Loc --      Pain Education --      Exclude from Growth Chart --     Most recent vital signs: Vitals:   06/18/23 1840  BP: 139/85  Pulse: (!) 108  Resp: (!) 22  Temp: 97.9 F (36.6 C)  SpO2: 100%    General: Awake, no distress.  CV:  Good peripheral perfusion.  Regular rate and rhythm  Resp:  Normal effort.  Somewhat diminished breath sounds bilaterally but no wheeze rales or rhonchi. Abd:  No distention.  Soft, nontender.  No rebound or guarding.  ED Results / Procedures / Treatments   EKG  EKG viewed and interpreted by myself shows sinus tachycardia 104 bpm with a narrow QRS, normal axis, normal intervals, no concerning ST changes.  RADIOLOGY  I reviewed  and interpreted chest x-ray images.  Patient appears to have a consolidation in the right middle to upper lobe. Radiology has confirmed mild right upper lobe linear atelectasis with a possible thin-walled cavitary lesion.  Recommend CT to evaluate further.   MEDICATIONS ORDERED IN ED: Medications  methylPREDNISolone sodium succinate (SOLU-MEDROL) 125 mg/2 mL injection 125 mg (has no administration in time range)  ipratropium-albuterol (DUONEB) 0.5-2.5 (3) MG/3ML nebulizer solution 3 mL (has no administration in time range)  ipratropium-albuterol (DUONEB) 0.5-2.5 (3) MG/3ML nebulizer solution 3 mL (has no administration in time range)     IMPRESSION / MDM / ASSESSMENT AND PLAN / ED COURSE  I reviewed the triage vital signs and the nursing notes.  Patient's presentation is most consistent with acute presentation with potential threat to life or bodily function.  Patient presents emergency department for intermittent chest pain and shortness of breath ongoing over the last few weeks.  Patient has a history of COPD and feels like her COPD is worsening causing her symptoms.  Currently satting 99 to 100% on room air with no baseline O2 requirement.  Clear lung sounds although  somewhat diminished bilaterally.  Patient's lab work is reassuring with a normal CBC with just minimal leukocytosis 11,900, chemistry shows no significant findings besides a bicarb of 16.  Troponin of 4 we will repeat a troponin as a precaution.  Patient's chest x-ray however does show this area of lineal atelectasis in the right upper lobe we will obtain a CT scan with contrast to further evaluate.  I discussed this with the patient she states they were watching a node in the lungs but approximately 6 months ago had an x-ray and per patient she was told that the node is gone.  CT scan shows advanced emphysema.  There is a new area of heterogeneous masslike consolidation with cavitation in the right upper lobe.  Findings could be  infectious or inflammatory in nature possibly neoplastic.  We will start the patient on antibiotics and a prednisone taper we will have the patient follow-up with pulmonology for further evaluation and consideration of biopsy.  I discussed this with the patient who is agreeable to this plan of care.  Did discuss the importance of following up for further evaluation to rule out cancerous causes.  I placed an order for ambulatory referral to the pulmonary nodule clinic.  FINAL CLINICAL IMPRESSION(S) / ED DIAGNOSES   Chest pain Dyspnea Lung nodule  Note:  This document was prepared using Dragon voice recognition software and may include unintentional dictation errors.   Minna Antis, MD 06/18/23 [phone]

## 2023-06-19 ENCOUNTER — Telehealth: Payer: Self-pay | Admitting: Pulmonary Disease

## 2023-06-19 DIAGNOSIS — R911 Solitary pulmonary nodule: Secondary | ICD-10-CM

## 2023-06-19 NOTE — Telephone Encounter (Signed)
We saw her in November 2023 she never followed up with studies and follow-up appointment as recommended.  The new process could be inflammatory/infectious.  Did they give her antibiotics in the ED?  If not she will need antibiotics.  We can put her on the schedule for next week.

## 2023-06-19 NOTE — Telephone Encounter (Signed)
Can you advise on when patient needs to be seen?

## 2023-06-19 NOTE — Telephone Encounter (Signed)
I spoke with the patient and scheduled her an appt for 8/6 at 2:30pm. She have a chest xray before her appt. She did get antibiotics from the ED.  Nothing further needed.

## 2023-06-19 NOTE — Telephone Encounter (Signed)
Pt. Was in hosp but needs sooner apt with Dr. Bernerd Limbo but nothing in sched. She had CT done and new mass was found

## 2023-06-21 ENCOUNTER — Encounter: Payer: Self-pay | Admitting: Intensive Care

## 2023-06-21 ENCOUNTER — Emergency Department: Payer: Medicare HMO

## 2023-06-21 ENCOUNTER — Other Ambulatory Visit: Payer: Self-pay

## 2023-06-21 ENCOUNTER — Emergency Department
Admission: EM | Admit: 2023-06-21 | Discharge: 2023-06-21 | Disposition: A | Discharge status: Home / Self Care | Payer: Medicare HMO | Attending: Student in an Organized Health Care Education/Training Program | Admitting: Student in an Organized Health Care Education/Training Program

## 2023-06-21 DIAGNOSIS — I10 Essential (primary) hypertension: Secondary | ICD-10-CM | POA: Diagnosis not present

## 2023-06-21 DIAGNOSIS — M25552 Pain in left hip: Secondary | ICD-10-CM | POA: Diagnosis present

## 2023-06-21 DIAGNOSIS — W1830XA Fall on same level, unspecified, initial encounter: Secondary | ICD-10-CM | POA: Insufficient documentation

## 2023-06-21 DIAGNOSIS — S7002XA Contusion of left hip, initial encounter: Secondary | ICD-10-CM | POA: Diagnosis not present

## 2023-06-21 DIAGNOSIS — J449 Chronic obstructive pulmonary disease, unspecified: Secondary | ICD-10-CM | POA: Insufficient documentation

## 2023-06-21 MED ORDER — FENTANYL CITRATE PF 50 MCG/ML IJ SOSY
50.0000 ug | PREFILLED_SYRINGE | Freq: Once | INTRAMUSCULAR | Status: AC
  Administered 2023-06-21: 50 ug via INTRAMUSCULAR
  Filled 2023-06-21: qty 1

## 2023-06-21 MED ORDER — ONDANSETRON 4 MG PO TBDP
4.0000 mg | ORAL_TABLET | Freq: Once | ORAL | Status: AC
  Administered 2023-06-21: 4 mg via ORAL
  Filled 2023-06-21: qty 1

## 2023-06-21 MED ORDER — HYDROCODONE-ACETAMINOPHEN 5-325 MG PO TABS: 1.0000 | ORAL_TABLET | Freq: Four times a day (QID) | ORAL | 0 refills | Status: DC | PRN

## 2023-06-21 NOTE — Discharge Instructions (Signed)
Follow-up with your regular doctor improving to 3 days.  Return if worsening.  Take medication as prescribed

## 2023-06-21 NOTE — ED Triage Notes (Signed)
Patient reports walking to use restroom and bumped into heater in the room and injured groin area. No burn- heater was not turned on

## 2023-06-21 NOTE — ED Notes (Signed)
See triage note  States she got up and hit the edge of her heater on Wednesday morning  Having pain to left groin area

## 2023-06-21 NOTE — ED Provider Notes (Signed)
[facility] Provider Note    Event Date/Time   First MD Initiated Contact with Patient 06/21/23 0815     (approximate)   History   Groin Injury   HPI  Krystal Lee is a 67 y.o. female with history of osteoporosis, COPD, hypertension presents emergency department with left hip pain.  Patient got up in the middle of the night and ran into a heater which caused her to fall backwards.  She states continued pain in the pelvic area that radiates down the left leg.  No fever or chills.  No chest pain/shortness of breath this time.  No head injury.      Physical Exam   Triage Vital Signs: ED Triage Vitals [06/21/23 0801]  Encounter Vitals Group     BP 110/71     Systolic BP Percentile      Diastolic BP Percentile      Pulse Rate (!) 114     Resp 16     Temp (!) 97.5 F (36.4 C)     Temp Source Oral     SpO2 96 %     Weight 148 lb (67.1 kg)     Height 4\' 11"  (1.499 m)     Head Circumference      Peak Flow      Pain Score 10     Pain Loc      Pain Education      Exclude from Growth Chart     Most recent vital signs: Vitals:   06/21/23 0801  BP: 110/71  Pulse: (!) 114  Resp: 16  Temp: (!) 97.5 F (36.4 C)  SpO2: 96%     General: Awake, no distress.    CV:  Good peripheral perfusion. regular rate and  rhythm Resp:  Normal effort. Lungs CTA Abd:  No distention.   Other:  Left hip tender to palpation, appears to have a small amount of leg shortening on the left, neurovascular intact   ED Results / Procedures / Treatments   Labs (all labs ordered are listed, but only abnormal results are displayed) Labs Reviewed - No data to display   EKG     RADIOLOGY X-ray of the left hip    PROCEDURES:   Procedures   MEDICATIONS ORDERED IN ED: Medications  fentaNYL (SUBLIMAZE) injection 50 mcg (50 mcg Intramuscular Given 06/21/23 0904)  ondansetron (ZOFRAN-ODT) disintegrating tablet 4 mg (4 mg Oral Given 06/21/23 0904)      IMPRESSION / MDM / ASSESSMENT AND PLAN / ED COURSE  I reviewed the triage vital signs and the nursing notes.                              Differential diagnosis includes, but is not limited to, fracture, contusion, strain, fall  Patient's presentation is most consistent with acute presentation with potential threat to life or bodily function.   Due to the patient's fall and osteoporosis go-ahead do x-ray of the left hip.  She is given fentanyl 50 mcg IM for pain.  X-ray of the left hip and pelvis was independently reviewed and interpreted by me as being negative for any acute abnormality.  Patient was able to stand at the bedside and walk across room and back.  When standing pain is in the lower back radiating around to the left hip.  Has a history of compression fractures so we will do a CT lumbar spine and  pelvis without contrast.   CT lumbar spine and pelvis without contrast, did independently review the radiologist report and interpreted this is being negative for any acute abnormality.  I did explain findings to the patient.  She continues to have pain.  Gave her prescription for Vicodin.  She is to follow-up with her regular doctor.  Return if worsening.  Also instructed to follow-up with orthopedics.  Discharged stable condition.  FINAL CLINICAL IMPRESSION(S) / ED DIAGNOSES   Final diagnoses:  Contusion of left hip, initial encounter     Rx / DC Orders   ED Discharge Orders          Ordered    HYDROcodone-acetaminophen (NORCO/VICODIN) 5-325 MG tablet  Every 6 hours PRN        06/21/23 1144             Note:  This document was prepared using Dragon voice recognition software and may include unintentional dictation errors.    Faythe Ghee, PA-C 06/21/23 1259    Willy Eddy, MD 06/21/23 1434

## 2023-06-21 NOTE — ED Notes (Signed)
Patient was pulled up in the bed by this medic and the PA. Patient was given a blanket

## 2023-06-25 ENCOUNTER — Ambulatory Visit
Admission: RE | Admit: 2023-06-25 | Discharge: 2023-06-25 | Disposition: A | Discharge status: Home / Self Care | Payer: Medicare HMO | Source: Ambulatory Visit | Attending: Pulmonary Disease | Admitting: Pulmonary Disease

## 2023-06-25 ENCOUNTER — Inpatient Hospital Stay: Admit: 2023-06-25 | Payer: Medicare HMO

## 2023-06-25 ENCOUNTER — Encounter: Payer: Self-pay | Admitting: Pulmonary Disease

## 2023-06-25 ENCOUNTER — Ambulatory Visit
Admission: RE | Admit: 2023-06-25 | Discharge: 2023-06-25 | Disposition: A | Discharge status: Home / Self Care | Payer: Medicare HMO | Attending: Pulmonary Disease | Admitting: Pulmonary Disease

## 2023-06-25 ENCOUNTER — Ambulatory Visit: Payer: Medicare HMO | Admitting: Pulmonary Disease

## 2023-06-25 VITALS — BP 112/70 | HR 107 | Temp 97.4°F | Ht 59.0 in | Wt 147.2 lb

## 2023-06-25 DIAGNOSIS — J441 Chronic obstructive pulmonary disease with (acute) exacerbation: Secondary | ICD-10-CM

## 2023-06-25 DIAGNOSIS — R911 Solitary pulmonary nodule: Secondary | ICD-10-CM | POA: Diagnosis not present

## 2023-06-25 DIAGNOSIS — B029 Zoster without complications: Secondary | ICD-10-CM

## 2023-06-25 DIAGNOSIS — R9389 Abnormal findings on diagnostic imaging of other specified body structures: Secondary | ICD-10-CM

## 2023-06-25 DIAGNOSIS — F17210 Nicotine dependence, cigarettes, uncomplicated: Secondary | ICD-10-CM

## 2023-06-25 MED ORDER — ACYCLOVIR 800 MG PO TABS: 800.0000 mg | ORAL_TABLET | Freq: Every day | ORAL | 0 refills | Status: AC

## 2023-06-25 MED ORDER — IPRATROPIUM-ALBUTEROL 0.5-2.5 (3) MG/3ML IN SOLN: 3.0000 mL | Freq: Four times a day (QID) | RESPIRATORY_TRACT | 2 refills | Status: DC

## 2023-06-25 MED ORDER — IPRATROPIUM-ALBUTEROL 0.5-2.5 (3) MG/3ML IN SOLN
3.0000 mL | Freq: Once | RESPIRATORY_TRACT | Status: AC
Start: 2023-06-25 — End: 2023-06-25
  Administered 2023-06-25: 3 mL via RESPIRATORY_TRACT

## 2023-06-25 NOTE — Patient Instructions (Signed)
Take your prednisone as prescribed.  We have sent in a prescription for an antiviral medication called Zovirax this is 1 tablet 5 times a day for 1 week.  This for your shingles.  This should also help with your back pain.  We have sent in a new nebulizer solution called DuoNeb (ipratropium/albuterol) this is 1 vial via the nebulizer 4 times a day.  DO NOT TAKE THE TRELEGY FOR NOW.  We will see you in follow-up in 1 to 2 weeks time with either me or the nurse practitioner.  We will schedule your follow-up CT once we evaluate you in follow-up.

## 2023-06-25 NOTE — Progress Notes (Unsigned)
Subjective:    Patient ID: Krystal Lee, female    DOB: [birth year], 67 y.o.   MRN: [id number]  Patient Care Team: Armando Gang, FNP as PCP - General (Family Medicine) Armando Gang, FNP (Family Medicine)  Chief Complaint  Patient presents with   Follow-up    SOB. Wheezing when she lays down. No cough.    HPI Krystal Lee is a 68 year old current smoker, who presents for evaluation of shortness of breath and wheezing in the setting of COPD exacerbation.  We had initially seen this patient in November 2023 for a lung nodule.  At that time follow-up CT was ordered as well as pulmonary function testing and the patient was advised to follow-up in 8 to 12 weeks time.  She did have follow-up CT that showed resolution of the nodule in question however she never followed up with PFTs nor with the visit.  She has been maintained on Trelegy Ellipta 200 by her primary care provider.  She notes however that over the last several weeks the medication has become less effective.  She was seen on 18 June 2023 in the emergency room for increased shortness of breath where a CT scan showed advanced emphysema and a new area of heterogeneous masslike consolidation with cavitation that could be infectious or inflammatory but neoplasm could not be excluded.  On my independent review this appears to be a pneumonic infiltrate and will need careful follow-up.  She was treated with antibiotics (cephalexin Azithromycin) and given a prednisone taper.  She is currently on the 20 mg dose of cephalexin.  She completed Azithromycin.  She is also still on prednisone.  She then presented to the emergency room on 2 August with a complaint of low back and left hip pain.  No particular etiology could be found and she was treated with as needed hydrocodone.  Today she states that she still having increasing shortness of breath, she is very uncomfortable with the back pain and wants to be admitted to the hospital.  She has not  had any fevers, chills or sweats since she started her antibiotics.  She has not had any nausea or vomiting.  No significant cough or sputum production since antibiotics were started.  No hemoptysis.  She has persisted to have the lower lumbar back pain radiating to her left lower extremity.  States it feels like a burning pain.  She does not endorse any other symptomatology.   Review of Systems A 10 point review of systems was performed and it is as noted above otherwise negative.   Patient Active Problem List   Diagnosis Date Noted   Schizophrenia, paranoid (HCC) 09/16/2022   Hallucinations 09/16/2022   Dyspnea 09/16/2022   Atypical chest pain 09/16/2022   Schizophrenia (HCC) 09/16/2022   Bipolar 1 disorder (HCC)    Depression    Anxiety    Hypertension    Sepsis (HCC)    HLD (hyperlipidemia)    GERD (gastroesophageal reflux disease)    Tobacco abuse    COPD exacerbation (HCC) 07/28/2018   Acute on chronic respiratory failure with hypoxia and hypercapnia (HCC) 09/16/2017   COPD with acute exacerbation (HCC) 09/16/2017    Social History   Tobacco Use   Smoking status: Every Day    Current packs/day: 0.00    Average packs/day: 1 pack/day for 45.0 years (45.0 ttl pk-yrs)    Types: Cigarettes    Start date: 05/01/1976    Last attempt to quit: 05/01/2021  Years since quitting: 2.1   Smokeless tobacco: Never   Tobacco comments:    since age 24    10 cigarettes a day. Khj 06/25/2023  Substance Use Topics   Alcohol use: Not Currently    Alcohol/week: 1.0 standard drink of alcohol    Types: 1 Cans of beer per week    No Known Allergies  Current Meds  Medication Sig   albuterol (PROVENTIL) (2.5 MG/3ML) 0.083% nebulizer solution Take 2.5 mg by nebulization every 6 (six) hours as needed for wheezing or shortness of breath.   albuterol (VENTOLIN HFA) 108 (90 Base) MCG/ACT inhaler Inhale 2 puffs into the lungs every 4 (four) hours as needed for shortness of breath or wheezing.    ALPRAZolam (XANAX) 0.5 MG tablet Take 0.5 mg by mouth 3 (three) times daily as needed for anxiety.   cephALEXin (KEFLEX) 500 MG capsule Take 1 capsule (500 mg total) by mouth 2 (two) times daily.   cholecalciferol (VITAMIN D3) 25 MCG (1000 UNIT) tablet Take 1,000 Units by mouth daily.   furosemide (LASIX) 20 MG tablet Take 1 tablet (20 mg total) by mouth daily.   HYDROcodone-acetaminophen (NORCO/VICODIN) 5-325 MG tablet Take 1 tablet by mouth every 6 (six) hours as needed for moderate pain.   montelukast (SINGULAIR) 10 MG tablet Take 10 mg by mouth at bedtime.   pantoprazole (PROTONIX) 40 MG tablet Take 1 tablet (40 mg total) by mouth daily after breakfast.   predniSONE (DELTASONE) 10 MG tablet Take 1 tablet (10 mg total) by mouth daily. Day 1-3: take 4 tablets PO daily Day 4-6: take 3 tablets PO daily Day 7-9: take 2 tablets PO daily Day 10-12: take 1 tablet PO daily   QUEtiapine Fumarate (SEROQUEL XR) 150 MG 24 hr tablet Take 150 mg by mouth at bedtime.   roflumilast (DALIRESP) 500 MCG TABS tablet Take 500 mcg by mouth daily.   rosuvastatin (CRESTOR) 40 MG tablet Take 40 mg by mouth at bedtime.   TRELEGY ELLIPTA 200-62.5-25 MCG/ACT AEPB Inhale 1 puff into the lungs daily.   valsartan (DIOVAN) 80 MG tablet Take 1 tablet (80 mg total) by mouth daily.   zonisamide (ZONEGRAN) 100 MG capsule Take 1 capsule (100 mg total) by mouth at bedtime.    Immunization History  Administered Date(s) Administered   Influenza,inj,Quad PF,6+ Mos 09/17/2017   Influenza-Unspecified 08/19/2018, 09/24/2022   Pneumococcal Polysaccharide-23 09/17/2017        Objective:     BP 112/70 (BP Location: Left Arm, Cuff Size: Normal)   Pulse (!) 107   Temp (!) 97.4 F (36.3 C)   Ht 4\' 11"  (1.499 m)   Wt 147 lb 3.2 oz (66.8 kg)   SpO2 99%   BMI 29.73 kg/m   SpO2: 99 % O2 Device: None (Room air)  GENERAL: Appears acutely ill but nontoxic.  Well-developed, overweight woman, uncomfortable but no acute respiratory  distress.  Fully ambulatory.  No conversational dyspnea. HEAD: Normocephalic, atraumatic.  EYES: Pupils equal, round, reactive to light.  No scleral icterus.  MOUTH: Oral mucosa moist.  No thrush. NECK: Supple. No thyromegaly. Trachea midline. No JVD.  No adenopathy. PULMONARY: Distant breath sounds with poor air movement bilaterally.  Coarse, otherwise no adventitious sounds. CARDIOVASCULAR: S1 and S2. Regular rate and rhythm.  No rubs, murmurs or gallops heard. ABDOMEN: Benign. MUSCULOSKELETAL: No joint deformity, no clubbing, no edema.  NEUROLOGIC: No overt focal deficit, no gait disturbance, speech is fluent. SKIN: Intact,warm,dry.  Classic herpes zoster eruption in the lumbosacral  dermatome on the left. PSYCH: Mood and behavior normal.    Patient received nebulizer treatment with DuoNeb x 1: Improved air entry bilaterally after treatment, improved shortness of breath.    July 30 chest x-ray showing the right upper lobe density (arrow):   Today's chest x-ray there is slight decrease on the right upper lobe density, formal interpretation pending:   Assessment & Plan:     ICD-10-CM   1. COPD with acute exacerbation (HCC)  J44.1 ipratropium-albuterol (DUONEB) 0.5-2.5 (3) MG/3ML nebulizer solution 3 mL   Complete antibiotics and prednisone taper Hold Trelegy for now DuoNebs 4 times a day Continue as needed albuterol    2. Abnormal CT of the chest  R93.89    It appears to be an infiltrate Query necrotizing pneumonia Follow-up imaging scheduled Chest x-ray today query decrease in size    3. Herpes zoster involving lumbar dermatome  B02.9    Zovirax 800 mg 5 times a day x days Advised on proper isolation around infants/immune compromised individuals    4. Tobacco dependence due to cigarettes  F17.210    Patient counseled regards to discontinuation of smoking Total counseling time 3 to 5 minutes     Meds ordered this encounter  Medications   ipratropium-albuterol (DUONEB)  0.5-2.5 (3) MG/3ML nebulizer solution 3 mL   ipratropium-albuterol (DUONEB) 0.5-2.5 (3) MG/3ML SOLN    Sig: Take 3 mLs by nebulization every 6 (six) hours.    Dispense:  360 mL    Refill:  2   acyclovir (ZOVIRAX) 800 MG tablet    Sig: Take 1 tablet (800 mg total) by mouth 5 (five) times daily for 7 days.    Dispense:  35 tablet    Refill:  0   We will see the patient in follow-up in 1 to 2 weeks time with either me or the nurse practitioner at that time.  We will schedule follow-up CT once she is evaluated on follow-up.  She is to contact us prior to that time should any new difficulties arise.   Gailen Shelter, MD Advanced Bronchoscopy PCCM [facility]    *This note was dictated using voice recognition software/Dragon.  Despite best efforts to proofread, errors can occur which can change the meaning. Any transcriptional errors that result from this process are unintentional and may not be fully corrected at the time of dictation.

## 2023-07-05 ENCOUNTER — Emergency Department
Admission: EM | Admit: 2023-07-05 | Discharge: 2023-07-05 | Disposition: A | Discharge status: Home / Self Care | Payer: Medicare HMO | Attending: Emergency Medicine | Admitting: Emergency Medicine

## 2023-07-05 ENCOUNTER — Emergency Department: Payer: Medicare HMO

## 2023-07-05 ENCOUNTER — Other Ambulatory Visit: Payer: Self-pay

## 2023-07-05 DIAGNOSIS — B029 Zoster without complications: Secondary | ICD-10-CM | POA: Diagnosis not present

## 2023-07-05 DIAGNOSIS — I10 Essential (primary) hypertension: Secondary | ICD-10-CM | POA: Diagnosis not present

## 2023-07-05 DIAGNOSIS — E785 Hyperlipidemia, unspecified: Secondary | ICD-10-CM

## 2023-07-05 DIAGNOSIS — J441 Chronic obstructive pulmonary disease with (acute) exacerbation: Secondary | ICD-10-CM | POA: Insufficient documentation

## 2023-07-05 DIAGNOSIS — J188 Other pneumonia, unspecified organism: Secondary | ICD-10-CM | POA: Insufficient documentation

## 2023-07-05 DIAGNOSIS — R0602 Shortness of breath: Secondary | ICD-10-CM | POA: Diagnosis present

## 2023-07-05 DIAGNOSIS — J189 Pneumonia, unspecified organism: Secondary | ICD-10-CM

## 2023-07-05 HISTORY — DX: Hyperlipidemia, unspecified: E78.5

## 2023-07-05 LAB — CBC
HCT: 37 % (ref 36.0–46.0)
Hemoglobin: 12.1 g/dL (ref 12.0–15.0)
MCH: 29.2 pg (ref 26.0–34.0)
MCHC: 32.7 g/dL (ref 30.0–36.0)
MCV: 89.4 fL (ref 80.0–100.0)
Platelets: 215 10*3/uL (ref 150–400)
RBC: 4.14 MIL/uL (ref 3.87–5.11)
RDW: 15.9 % — ABNORMAL HIGH (ref 11.5–15.5)
WBC: 10.9 10*3/uL — ABNORMAL HIGH (ref 4.0–10.5)
nRBC: 0 % (ref 0.0–0.2)

## 2023-07-05 LAB — COMPREHENSIVE METABOLIC PANEL
ALT: 12 U/L (ref 0–44)
AST: 13 U/L — ABNORMAL LOW (ref 15–41)
Albumin: 3.3 g/dL — ABNORMAL LOW (ref 3.5–5.0)
Alkaline Phosphatase: 65 U/L (ref 38–126)
Anion gap: 11 (ref 5–15)
BUN: 9 mg/dL (ref 8–23)
CO2: 23 mmol/L (ref 22–32)
Calcium: 9.1 mg/dL (ref 8.9–10.3)
Chloride: 105 mmol/L (ref 98–111)
Creatinine, Ser: 0.69 mg/dL (ref 0.44–1.00)
GFR, Estimated: >60 mL/min (ref >60)
Glucose, Bld: 128 mg/dL — ABNORMAL HIGH (ref 70–99)
Potassium: 3.1 mmol/L — ABNORMAL LOW (ref 3.5–5.1)
Sodium: 139 mmol/L (ref 135–145)
Total Bilirubin: 0.5 mg/dL (ref 0.3–1.2)
Total Protein: 6.5 g/dL (ref 6.5–8.1)

## 2023-07-05 LAB — URINALYSIS, ROUTINE W REFLEX MICROSCOPIC
Bilirubin Urine: NEGATIVE
Glucose, UA: NEGATIVE mg/dL
Hgb urine dipstick: NEGATIVE
Ketones, ur: NEGATIVE mg/dL
Leukocytes,Ua: NEGATIVE
Nitrite: NEGATIVE
Protein, ur: NEGATIVE mg/dL
Specific Gravity, Urine: 1.006 (ref 1.005–1.030)
pH: 6 (ref 5.0–8.0)

## 2023-07-05 LAB — TROPONIN I (HIGH SENSITIVITY): Troponin I (High Sensitivity): 6 ng/L (ref <18)

## 2023-07-05 MED ORDER — DOXYCYCLINE HYCLATE 100 MG PO CAPS: 100.0000 mg | ORAL_CAPSULE | Freq: Two times a day (BID) | ORAL | 0 refills | Status: AC

## 2023-07-05 MED ORDER — MORPHINE SULFATE (PF) 4 MG/ML IV SOLN
4.0000 mg | Freq: Once | INTRAVENOUS | Status: AC
  Administered 2023-07-05: 4 mg via INTRAVENOUS
  Filled 2023-07-05: qty 1

## 2023-07-05 MED ORDER — POTASSIUM CHLORIDE CRYS ER 20 MEQ PO TBCR: 40.0000 meq | EXTENDED_RELEASE_TABLET | Freq: Once | ORAL | Status: DC

## 2023-07-05 MED ORDER — PREDNISONE 10 MG PO TABS: ORAL_TABLET | ORAL | 0 refills | Status: AC

## 2023-07-05 MED ORDER — IPRATROPIUM-ALBUTEROL 0.5-2.5 (3) MG/3ML IN SOLN
3.0000 mL | Freq: Once | RESPIRATORY_TRACT | Status: AC
  Administered 2023-07-05: 3 mL via RESPIRATORY_TRACT
  Filled 2023-07-05: qty 3

## 2023-07-05 MED ORDER — IOHEXOL 350 MG/ML SOLN
75.0000 mL | Freq: Once | INTRAVENOUS | Status: AC | PRN
  Administered 2023-07-05: 75 mL via INTRAVENOUS

## 2023-07-05 MED ORDER — OXYCODONE-ACETAMINOPHEN 5-325 MG PO TABS
1.0000 | ORAL_TABLET | Freq: Once | ORAL | Status: AC
  Administered 2023-07-05: 1 via ORAL
  Filled 2023-07-05: qty 1

## 2023-07-05 MED ORDER — PREDNISONE 20 MG PO TABS
60.0000 mg | ORAL_TABLET | Freq: Once | ORAL | Status: AC
  Administered 2023-07-05: 60 mg via ORAL
  Filled 2023-07-05: qty 3

## 2023-07-05 MED ORDER — OXYCODONE-ACETAMINOPHEN 5-325 MG PO TABS: 1.0000 | ORAL_TABLET | ORAL | 0 refills | Status: DC | PRN

## 2023-07-05 MED ORDER — POTASSIUM CHLORIDE 20 MEQ PO PACK
40.0000 meq | PACK | Freq: Once | ORAL | Status: AC
  Administered 2023-07-05: 40 meq via ORAL
  Filled 2023-07-05: qty 2

## 2023-07-05 MED ORDER — LIDOCAINE 5 % EX PTCH
1.0000 | MEDICATED_PATCH | CUTANEOUS | Status: DC
  Filled 2023-07-05: qty 1

## 2023-07-05 NOTE — ED Provider Notes (Signed)
[facility] Provider Note    Event Date/Time   First MD Initiated Contact with Patient 07/05/23 1345     (approximate)   History   Chief Complaint Weakness, Pelvic Pain, and Back Pain   HPI  Krystal Lee is a 67 y.o. female with past medical history of hypertension, hyperlipidemia, COPD, schizophrenia, and bipolar disorder who presents to the ED complaining of shortness of breath and back pain.  Patient reports that she has had about 10 days of pain and rash in her left lower back, previously diagnosed with shingles by her pulmonologist.  She states that she completed a course of acyclovir yesterday but continues to have significant pain in this area.  She has been taking Tylenol for pain without relief, does state that the rash seems to be scabbing over with no erythema, warmth, or drainage.  She additionally reports increasing difficulty breathing with tightness in her chest, has not had any fevers or cough denies any overt pain in her chest.  She states she has been using her nebulizer at home without significant relief.  She has not noticed any pain or swelling in her legs.     Physical Exam   Triage Vital Signs: ED Triage Vitals [07/05/23 1306]  Encounter Vitals Group     BP 130/79     Systolic BP Percentile      Diastolic BP Percentile      Pulse Rate (!) 108     Resp 18     Temp (!) 97.5 F (36.4 C)     Temp Source Oral     SpO2 95 %     Weight 149 lb (67.6 kg)     Height 4\' 11"  (1.499 m)     Head Circumference      Peak Flow      Pain Score 10     Pain Loc      Pain Education      Exclude from Growth Chart     Most recent vital signs: Vitals:   07/05/23 1306 07/05/23 1816  BP: 130/79 (!) 157/110  Pulse: (!) 108 91  Resp: 18 18  Temp: (!) 97.5 F (36.4 C) 98.3 F (36.8 C)  SpO2: 95% 95%    Constitutional: Alert and oriented. Eyes: Conjunctivae are normal. Head: Atraumatic. Nose: No congestion/rhinnorhea. Mouth/Throat:  Mucous membranes are moist.  Cardiovascular: Tachycardic, regular rhythm. Grossly normal heart sounds.  2+ radial pulses bilaterally. Respiratory: Normal respiratory effort.  No retractions. Lungs with poor air movement throughout and faint expiratory wheezing. Gastrointestinal: Soft and nontender. No distention. Musculoskeletal: No lower extremity tenderness nor edema.  Scabbed over patchy rash to left lumbar area, no surrounding erythema, warmth, or drainage noted. Neurologic:  Normal speech and language. No gross focal neurologic deficits are appreciated.    ED Results / Procedures / Treatments   Labs (all labs ordered are listed, but only abnormal results are displayed) Labs Reviewed  CBC - Abnormal; Notable for the following components:      Result Value   WBC 10.9 (*)    RDW 15.9 (*)    All other components within normal limits  URINALYSIS, ROUTINE W REFLEX MICROSCOPIC - Abnormal; Notable for the following components:   Color, Urine YELLOW (*)    APPearance HAZY (*)    All other components within normal limits  COMPREHENSIVE METABOLIC PANEL - Abnormal; Notable for the following components:   Potassium 3.1 (*)    Glucose, Bld 128 (*)  Albumin 3.3 (*)    AST 13 (*)    All other components within normal limits  TROPONIN I (HIGH SENSITIVITY)     EKG  ED ECG REPORT I, Chesley Noon, the attending physician, personally viewed and interpreted this ECG.   Date: 07/05/2023  EKG Time: 14:09  Rate: 94  Rhythm: normal sinus rhythm  Axis: Normal  Intervals:none  ST&T Change: None  RADIOLOGY Chest x-ray reviewed and interpreted by me with right-sided infiltrate, no edema or effusion noted.  PROCEDURES:  Critical Care performed: No  Procedures   MEDICATIONS ORDERED IN ED: Medications  lidocaine (LIDODERM) 5 % 1 patch (1 patch Transdermal Patient Refused/Not Given 07/05/23 1448)  ipratropium-albuterol (DUONEB) 0.5-2.5 (3) MG/3ML nebulizer solution 3 mL (3 mLs  Nebulization Given 07/05/23 1449)  predniSONE (DELTASONE) tablet 60 mg (60 mg Oral Given 07/05/23 1447)  oxyCODONE-acetaminophen (PERCOCET/ROXICET) 5-325 MG per tablet 1 tablet (1 tablet Oral Given 07/05/23 1447)  potassium chloride (KLOR-CON) packet 40 mEq (40 mEq Oral Given 07/05/23 1657)  morphine (PF) 4 MG/ML injection 4 mg (4 mg Intravenous Given 07/05/23 1750)  iohexol (OMNIPAQUE) 350 MG/ML injection 75 mL (75 mLs Intravenous Contrast Given 07/05/23 1720)     IMPRESSION / MDM / ASSESSMENT AND PLAN / ED COURSE  I reviewed the triage vital signs and the nursing notes.                              68 y.o. female with past medical history of hypertension, hyperlipidemia, COPD, schizophrenia, and bipolar disorder who presents to the ED complaining of ongoing pain related to shingles rash over her left lower back also with chest tightness and increasing difficulty breathing for the past few days.  Patient's presentation is most consistent with acute presentation with potential threat to life or bodily function.  Differential diagnosis includes, but is not limited to, shingles, cellulitis, COPD exacerbation, CHF, ACS, PE, pneumonia, pneumothorax, musculoskeletal pain, GERD, anxiety.  Patient nontoxic-appearing and in no acute distress, vital signs remarkable for mild tachycardia but otherwise reassuring.  She is not in any respiratory distress and maintaining oxygen saturations on room air, does have poor air movement with expiratory wheezing.  We will treat with prednisone and DuoNeb, chest x-ray results pending at this time.  EKG shows no evidence of arrhythmia or ischemia, will add on troponin but low suspicion for ACS or PE at this time.  Labs without significant anemia, leukocytosis, chest abnormality, or AKI.  LFTs are also unremarkable.  She does have healing shingles rash to her left lower back, no signs of associated cellulitis.  We will treat symptomatically with Percocet and Lidoderm  patch.  Chest x-ray concern for worsening right sided infiltrate, patient previously found to have mass versus cavitating pneumonia.  CTA performed and negative for PE, does show similar lesion to what was seen on CT imaging about 2 weeks ago.  Patient was offered admission, but declines and has follow-up scheduled with pulmonary next week.  We will restart prednisone taper as she does seem to have a component of COPD exacerbation, also start patient on doxycycline given MRSA coverage for cavitary lesion.  She was counseled to return to the ED for new or worsening symptoms, patient agrees with plan.      FINAL CLINICAL IMPRESSION(S) / ED DIAGNOSES   Final diagnoses:  COPD exacerbation (HCC)  Cavitary pneumonia  Herpes zoster without complication     Rx / DC Orders  ED Discharge Orders          Ordered    predniSONE (DELTASONE) 10 MG tablet  Daily        07/05/23 1852    doxycycline (VIBRAMYCIN) 100 MG capsule  2 times daily        07/05/23 1852             Note:  This document was prepared using Dragon voice recognition software and may include unintentional dictation errors.   Chesley Noon, MD 07/05/23 [phone]

## 2023-07-05 NOTE — ED Triage Notes (Signed)
Pt states that she was recently diagnosed her with shingles and she competed the treatment Monday, states that she cont to have severe pain, reports lower abd, pelvis and lower back pain, states that she thinks she saw blood in her salvia this am, denies gum issues, states that she thinks the blood is coming from her lungs due to recent pneumonia but states that she can't cough, states that it feels trapped in her lungs and reports feeling like a house is on her chest

## 2023-07-11 ENCOUNTER — Encounter: Payer: Self-pay | Admitting: Pulmonary Disease

## 2023-07-11 ENCOUNTER — Ambulatory Visit: Payer: Medicare HMO | Admitting: Pulmonary Disease

## 2023-07-11 VITALS — BP 142/82 | HR 103 | Temp 97.8°F | Ht 59.0 in | Wt 147.0 lb

## 2023-07-11 DIAGNOSIS — F17210 Nicotine dependence, cigarettes, uncomplicated: Secondary | ICD-10-CM | POA: Diagnosis not present

## 2023-07-11 DIAGNOSIS — R9389 Abnormal findings on diagnostic imaging of other specified body structures: Secondary | ICD-10-CM | POA: Diagnosis not present

## 2023-07-11 DIAGNOSIS — B029 Zoster without complications: Secondary | ICD-10-CM | POA: Diagnosis not present

## 2023-07-11 DIAGNOSIS — J441 Chronic obstructive pulmonary disease with (acute) exacerbation: Secondary | ICD-10-CM

## 2023-07-11 MED ORDER — BUDESONIDE 0.25 MG/2ML IN SUSP: 0.2500 mg | Freq: Two times a day (BID) | RESPIRATORY_TRACT | 6 refills | Status: DC

## 2023-07-11 NOTE — Progress Notes (Signed)
Subjective:    Patient ID: Krystal Lee, female    DOB: Feb 27, 1956, 67 y.o.   MRN: [id number]  Patient Care Team: Armando Gang, FNP as PCP - General (Family Medicine) Armando Gang, FNP (Family Medicine)  Chief Complaint  Patient presents with   Follow-up    SOB is a little better. Wheezing at night. Cough with white sputum.    HPI Krystal Lee is a 67 year old current smoker, who presents for follow-up on the issue of shortness of breath and wheezing in the setting of COPD exacerbation.  We had initially seen this patient in November 2023 for a lung nodule.  At that time follow-up CT was ordered as well as pulmonary function testing and the patient was advised to follow-up in 8 to 12 weeks time.  She did have follow-up CT that showed resolution of the nodule in question however she never followed up with PFTs nor with the visit. I then saw her on 25 June 2023 as an acute visit at that time she noted that she had been on Trelegy Ellipta 200 by her primary care provider.  However she was having difficulties with the medication becoming less effective a few weeks prior to her 6 August visit. She had been seen on 18 June 2023 in the emergency room for increased shortness of breath where a CT scan showed advanced emphysema and a new area of heterogeneous masslike consolidation with cavitation that could be infectious or inflammatory but neoplasm could not be excluded.  On my independent review this appeared to be a pneumonic infiltrate (necrotizing pneumonia) and will need careful follow-up.  She was treated with antibiotics (cephalexin Azithromycin) and given a prednisone taper.  She then presented to the emergency room on 2 August with a complaint of low back and left hip pain.  No particular etiology could be found and she was treated with as needed hydrocodone.  However during her 6 August visit she was noted to have herpes zoster following a lumbar dermatome and she was treated with  Zovirax.  During that visit she was also switched to DuoNebs 4 times a day and the Trelegy was discontinued.  She then was seen in the emergency room again on 16 August with same complaints of shortness of breath and back pain.  She was given a prednisone taper and doxycycline for 14 days.  She is completing that course tomorrow.  She is also completing prednisone.  CT angio chest was performed on 16 August showed that the lesion on her right upper lobe was still present.  On my review it appears that it is an evolving infiltrate.  Today she states that she still having shortness of breath, however she feels that this is improving.  Her back pain is also improving, she completed Zovirax.  She has not had any fevers, chills or sweats since she started her antibiotics.  She has not had any nausea or vomiting.  She has had however some loose stools and abdominal/pelvic discomfort.  No significant cough or sputum production since antibiotics were started.  No hemoptysis. She does not endorse any other symptomatology.  She presents with her sister today.  I showed the patient and her sister the most recent CT scan.  Discussed the concerns with regards to the findings on the right upper lobe.    Review of Systems A 10 point review of systems was performed and it is as noted above otherwise negative.   Patient Active Problem List  Diagnosis Date Noted   Schizophrenia, paranoid (HCC) 09/16/2022   Hallucinations 09/16/2022   Dyspnea 09/16/2022   Atypical chest pain 09/16/2022   Schizophrenia (HCC) 09/16/2022   Bipolar 1 disorder (HCC)    Depression    Anxiety    Hypertension    Sepsis (HCC)    HLD (hyperlipidemia)    GERD (gastroesophageal reflux disease)    Tobacco abuse    COPD exacerbation (HCC) 07/28/2018   Acute on chronic respiratory failure with hypoxia and hypercapnia (HCC) 09/16/2017   COPD with acute exacerbation (HCC) 09/16/2017    Social History   Tobacco Use   Smoking status:  Every Day    Current packs/day: 0.00    Average packs/day: 1 pack/day for 45.0 years (45.0 ttl pk-yrs)    Types: Cigarettes    Start date: 05/01/1976    Last attempt to quit: 05/01/2021    Years since quitting: 2.1   Smokeless tobacco: Never   Tobacco comments:    since age 40    2 cigarettes a day. Khj 07/11/2023  Substance Use Topics   Alcohol use: Not Currently    Alcohol/week: 1.0 standard drink of alcohol    Types: 1 Cans of beer per week    No Known Allergies  Current Meds  Medication Sig   albuterol (PROVENTIL) (2.5 MG/3ML) 0.083% nebulizer solution Take 2.5 mg by nebulization every 6 (six) hours as needed for wheezing or shortness of breath.   albuterol (VENTOLIN HFA) 108 (90 Base) MCG/ACT inhaler Inhale 2 puffs into the lungs every 4 (four) hours as needed for shortness of breath or wheezing.   ALPRAZolam (XANAX) 0.5 MG tablet Take 0.5 mg by mouth 3 (three) times daily as needed for anxiety.   budesonide (PULMICORT) 0.25 MG/2ML nebulizer solution Take 2 mLs (0.25 mg total) by nebulization 2 (two) times daily.   cephALEXin (KEFLEX) 500 MG capsule Take 1 capsule (500 mg total) by mouth 2 (two) times daily.   cholecalciferol (VITAMIN D3) 25 MCG (1000 UNIT) tablet Take 1,000 Units by mouth daily.   doxycycline (VIBRAMYCIN) 100 MG capsule Take 1 capsule (100 mg total) by mouth 2 (two) times daily for 7 days.   furosemide (LASIX) 20 MG tablet Take 1 tablet (20 mg total) by mouth daily.   ipratropium-albuterol (DUONEB) 0.5-2.5 (3) MG/3ML SOLN Take 3 mLs by nebulization every 6 (six) hours.   montelukast (SINGULAIR) 10 MG tablet Take 10 mg by mouth at bedtime.   oxyCODONE-acetaminophen (PERCOCET) 5-325 MG tablet Take 1 tablet by mouth every 4 (four) hours as needed for severe pain.   pantoprazole (PROTONIX) 40 MG tablet Take 1 tablet (40 mg total) by mouth daily after breakfast.   predniSONE (DELTASONE) 10 MG tablet Take 6 tablets (60 mg total) by mouth daily for 2 days, THEN 5 tablets  (50 mg total) daily for 2 days, THEN 4 tablets (40 mg total) daily for 2 days, THEN 3 tablets (30 mg total) daily for 2 days, THEN 2 tablets (20 mg total) daily for 2 days, THEN 1 tablet (10 mg total) daily for 2 days.   QUEtiapine Fumarate (SEROQUEL XR) 150 MG 24 hr tablet Take 150 mg by mouth at bedtime.   roflumilast (DALIRESP) 500 MCG TABS tablet Take 500 mcg by mouth daily.   rosuvastatin (CRESTOR) 40 MG tablet Take 40 mg by mouth at bedtime.   valsartan (DIOVAN) 80 MG tablet Take 1 tablet (80 mg total) by mouth daily.   zonisamide (ZONEGRAN) 100 MG capsule Take 1  capsule (100 mg total) by mouth at bedtime.    Immunization History  Administered Date(s) Administered   Influenza,inj,Quad PF,6+ Mos 09/17/2017   Influenza-Unspecified 08/19/2018, 09/24/2022   Pneumococcal Polysaccharide-23 09/17/2017        Objective:     BP (!) 142/82 (BP Location: Left Arm, Cuff Size: Normal)   Pulse (!) 103   Temp 97.8 F (36.6 C)   Ht 4\' 11"  (1.499 m)   Wt 147 lb (66.7 kg)   SpO2 98%   BMI 29.69 kg/m   SpO2: 98 % O2 Device: None (Room air)  GENERAL: Appears acutely ill but nontoxic.  Well-developed, overweight woman, uncomfortable but no acute respiratory distress.  Fully ambulatory.  No conversational dyspnea. HEAD: Normocephalic, atraumatic.  EYES: Pupils equal, round, reactive to light.  No scleral icterus.  MOUTH: Oral mucosa moist.  No thrush. NECK: Supple. No thyromegaly. Trachea midline. No JVD.  No adenopathy. PULMONARY: Distant breath sounds with poor air movement bilaterally.  Coarse, otherwise no adventitious sounds. CARDIOVASCULAR: S1 and S2. Regular rate and rhythm.  No rubs, murmurs or gallops heard. ABDOMEN: Benign. MUSCULOSKELETAL: No joint deformity, no clubbing, no edema.  NEUROLOGIC: No overt focal deficit, no gait disturbance, speech is fluent. SKIN: Intact,warm,dry.  Classic herpes zoster eruption in the lumbosacral dermatome on the left. PSYCH: Mood and behavior  normal.  Representative image from the CT performed 05 July 2023 showing the masslike infiltrate on the right upper lobe:    Assessment & Plan:     ICD-10-CM   1. COPD with acute exacerbation (HCC)  J44.1 DG Chest 2 View   Continue DuoNebs 4 times a day Add Pulmicort 0.25 mg via neb twice daily Complete antibiotics and prednisone    2. Abnormal CT of the chest  R93.89 DG Chest 2 View   Will need follow-up after exacerbation has cleared Discussed that cannot rule out mass underlying potential infiltrate    3. Herpes zoster involving lumbar dermatome  B02.9    Completed Zovirax Symptomatically improved    4. Tobacco dependence due to cigarettes  F17.210    Patient counseled regards to discontinuation of smoking Total counseling time 3 to 5 minutes      Orders Placed This Encounter  Procedures   DG Chest 2 View    Standing Status:   Future    Standing Expiration Date:   07/10/2024    Order Specific Question:   Reason for Exam (SYMPTOM  OR DIAGNOSIS REQUIRED)    Answer:   pneumonia    Order Specific Question:   Preferred imaging location?    Answer:   [facility]    Meds ordered this encounter  Medications   budesonide (PULMICORT) 0.25 MG/2ML nebulizer solution    Sig: Take 2 mLs (0.25 mg total) by nebulization 2 (two) times daily.    Dispense:  120 mL    Refill:  6   Symptomatically Ms. Shillinglaw appears to be doing better.  She however, continues to smoke and she was advised that he needs to stop this ASAP.  To date we do not know what her baseline pulmonary function is as she never kept the appointment for PFTs.  I suspect she has fairly advanced COPD.  We will continue nebulization treatments.  Added budesonide to her nebulizer regimen.  She will need follow-up of the process on the right upper lobe.  She is not a candidate for invasive procedures at present due to decompensated COPD status which appears to be improving slowly.  She has a follow-up appointment on 5  September and we will do chest x-ray at that time.  She will need repeat CT chest in approximately 6 to 8 weeks to ensure that the right upper lobe process has responded to therapy and if not then next steps will need to be determined.   Gailen Shelter, MD Advanced Bronchoscopy PCCM [facility]    *This note was dictated using voice recognition software/Dragon.  Despite best efforts to proofread, errors can occur which can change the meaning. Any transcriptional errors that result from this process are unintentional and may not be fully corrected at the time of dictation.

## 2023-07-11 NOTE — Patient Instructions (Addendum)
Please complete the antibiotics you already have.  Complete the prednisone you have.  Stop the Daliresp (Roflumilast).  This may be causing some of your stomach upset.  Please stop smoking.  Continue the DuoNeb (ipratropium/albuterol) 4 times a day.  We are adding a nebulizer occasionally called Pulmicort (budesonide) this is twice a day.  Use it after one of your DuoNeb treatments.  Do not mix it with the DuoNeb because it will foam.  We will see you as scheduled on 5 September.  On that day please come early so that you can get a chest x-ray done stop by the x-ray department before you come to the office.  Also note that we will be on our new office on the first floor.

## 2023-07-25 ENCOUNTER — Ambulatory Visit: Payer: Medicare HMO | Admitting: Pulmonary Disease

## 2023-07-25 ENCOUNTER — Ambulatory Visit
Admission: RE | Admit: 2023-07-25 | Discharge: 2023-07-25 | Disposition: A | Discharge status: Home / Self Care | Payer: Medicare HMO | Attending: Pulmonary Disease | Admitting: Pulmonary Disease

## 2023-07-25 ENCOUNTER — Ambulatory Visit
Admission: RE | Admit: 2023-07-25 | Discharge: 2023-07-25 | Disposition: A | Discharge status: Home / Self Care | Payer: Medicare HMO | Source: Ambulatory Visit | Attending: Pulmonary Disease | Admitting: Pulmonary Disease

## 2023-07-25 ENCOUNTER — Encounter: Payer: Self-pay | Admitting: Pulmonary Disease

## 2023-07-25 VITALS — BP 110/70 | HR 96 | Temp 98.1°F | Ht 59.0 in | Wt 150.0 lb

## 2023-07-25 DIAGNOSIS — J441 Chronic obstructive pulmonary disease with (acute) exacerbation: Secondary | ICD-10-CM | POA: Insufficient documentation

## 2023-07-25 DIAGNOSIS — J189 Pneumonia, unspecified organism: Secondary | ICD-10-CM

## 2023-07-25 DIAGNOSIS — R9389 Abnormal findings on diagnostic imaging of other specified body structures: Secondary | ICD-10-CM

## 2023-07-25 HISTORY — DX: Abnormal findings on diagnostic imaging of other specified body structures: R93.89

## 2023-07-25 MED ORDER — BUDESONIDE 0.25 MG/2ML IN SUSP: 0.2500 mg | Freq: Two times a day (BID) | RESPIRATORY_TRACT | 6 refills | Status: DC

## 2023-07-25 MED ORDER — IPRATROPIUM-ALBUTEROL 0.5-2.5 (3) MG/3ML IN SOLN: 3.0000 mL | Freq: Four times a day (QID) | RESPIRATORY_TRACT | 2 refills | Status: DC

## 2023-07-25 NOTE — Patient Instructions (Addendum)
Your lungs sounded clear today.  We are going to get a chest CT to follow-up on the area of concern in your lung.  We will send your prescriptions for the nebulizers through [facility].  We will see you in follow-up in 6 to 8 weeks time call sooner should any new problems arise.

## 2023-07-25 NOTE — Progress Notes (Signed)
Subjective:    Patient ID: Krystal Lee, female    DOB: July 30, 1956, 67 y.o.   MRN: [id number]  Patient Care Team: Armando Gang, FNP as PCP - General (Family Medicine) Armando Gang, FNP (Family Medicine)  Chief Complaint  Patient presents with   Follow-up    SOB in the mornings. No cough. Some wheezing.     HPI [name] is a 67 year old current smoker, who presents for follow-up on the issue of shortness of breath and wheezing in the setting of COPD exacerbation.  We had initially seen this patient in November 2023 for a lung nodule.  At that time follow-up CT was ordered as well as pulmonary function testing and the patient was advised to follow-up in 8 to 12 weeks time.  She did have follow-up CT that showed resolution of the nodule in question however, she never followed up with PFTs nor with the visit. I then saw her on 25 June 2023 as an acute visit at that time she noted that she had been on Trelegy Ellipta 200 by her primary care provider.  However, she was having difficulties with the medication becoming less effective a few weeks prior to her 6 August visit. She had been seen on 18 June 2023 in the emergency room for increased shortness of breath where a CT scan showed advanced emphysema and a new area of heterogeneous masslike consolidation with cavitation that could be infectious or inflammatory but neoplasm could not be excluded.  On my independent review this appeared to be a pneumonic infiltrate (necrotizing pneumonia) and will need careful follow-up.  She was treated with antibiotics (cephalexin + Azithromycin) and given a prednisone taper.  She then presented to the emergency room on 2 August with a complaint of low back and left hip pain.  No particular etiology could be found and she was treated with as needed hydrocodone.  However, during her 6 August visit she was noted to have herpes zoster following a lumbar dermatome and she was treated with Zovirax.  During  that visit she was also switched to DuoNebs 4 times a day and the Trelegy was discontinued.  She then was seen in the emergency room again on 16 August with same complaints of shortness of breath and back pain.  She was given a prednisone taper and doxycycline for 14 days.  A CT angio chest was performed on 16 August during her ER visit and it showed that the lesion on her right upper lobe was still present.  On my review it appears that it is an evolving mass-like infiltrate.  At her visit here on 22 August she stated that she still having shortness of breath, however she felt that this was improving.  Her back pain was also improving.    Since her most recent visit on 22 August she has made steady progress.  She feels that DuoNeb and Pulmicort are working well for her and prefers it to the inhalers.  She has been compliant with nocturnal oxygen at 2 L/min.  She has not had any fevers, chills or sweats since her last visit.  She has not had any nausea or vomiting.  No GI discomfort or symptoms.  No cough or sputum production since antibiotics were completed.  No hemoptysis. She does not endorse any other symptomatology.   She had a chest x-ray performed today prior to this visit it still shows an opacity on the right upper lobe albeit, on independent review, perhaps less prominent.  Review of Systems A 10 point review of systems was performed and it is as noted above otherwise negative.   Patient Active Problem List   Diagnosis Date Noted   Abnormal CT of the chest 07/25/2023   Schizophrenia, paranoid (HCC) 09/16/2022   Hallucinations 09/16/2022   Dyspnea 09/16/2022   Atypical chest pain 09/16/2022   Schizophrenia (HCC) 09/16/2022   Bipolar 1 disorder (HCC)    Depression    Anxiety    Hypertension    Sepsis (HCC)    HLD (hyperlipidemia)    GERD (gastroesophageal reflux disease)    Tobacco abuse    COPD exacerbation (HCC) 07/28/2018   Acute on chronic respiratory failure with hypoxia and  hypercapnia (HCC) 09/16/2017   COPD with acute exacerbation (HCC) 09/16/2017    Social History   Tobacco Use   Smoking status: Every Day    Current packs/day: 0.00    Average packs/day: 1 pack/day for 45.0 years (45.0 ttl pk-yrs)    Types: Cigarettes    Start date: 05/01/1976    Last attempt to quit: 05/01/2021    Years since quitting: 2.2   Smokeless tobacco: Never   Tobacco comments:    since age 11    5 cigarettes a day 07/25/2023 khj  Substance Use Topics   Alcohol use: Not Currently    Alcohol/week: 1.0 standard drink of alcohol    Types: 1 Cans of beer per week    No Known Allergies  Current Meds  Medication Sig   albuterol (VENTOLIN HFA) 108 (90 Base) MCG/ACT inhaler Inhale 2 puffs into the lungs every 4 (four) hours as needed for shortness of breath or wheezing.   ALPRAZolam (XANAX) 0.5 MG tablet Take 0.5 mg by mouth 3 (three) times daily as needed for anxiety.   cholecalciferol (VITAMIN D3) 25 MCG (1000 UNIT) tablet Take 1,000 Units by mouth daily.   furosemide (LASIX) 20 MG tablet Take 1 tablet (20 mg total) by mouth daily.   montelukast (SINGULAIR) 10 MG tablet Take 10 mg by mouth at bedtime.   pantoprazole (PROTONIX) 40 MG tablet Take 1 tablet (40 mg total) by mouth daily after breakfast.   QUEtiapine Fumarate (SEROQUEL XR) 150 MG 24 hr tablet Take 150 mg by mouth at bedtime.   rosuvastatin (CRESTOR) 40 MG tablet Take 40 mg by mouth at bedtime.   valsartan (DIOVAN) 80 MG tablet Take 1 tablet (80 mg total) by mouth daily.   zonisamide (ZONEGRAN) 100 MG capsule Take 1 capsule (100 mg total) by mouth at bedtime.   [DISCONTINUED] budesonide (PULMICORT) 0.25 MG/2ML nebulizer solution Take 2 mLs (0.25 mg total) by nebulization 2 (two) times daily.   [DISCONTINUED] ipratropium-albuterol (DUONEB) 0.5-2.5 (3) MG/3ML SOLN Take 3 mLs by nebulization every 6 (six) hours.    Immunization History  Administered Date(s) Administered   Influenza,inj,Quad PF,6+ Mos 09/17/2017    Influenza-Unspecified 08/19/2018, 09/24/2022   Pneumococcal Polysaccharide-23 09/17/2017        Objective:     BP 110/70 (BP Location: Right Arm, Cuff Size: Normal)   Pulse 96   Temp 98.1 F (36.7 C)   Ht 4\' 11"  (1.499 m)   Wt 150 lb (68 kg) Comment: per patient. in a wheelchair today  SpO2 96%   BMI 30.30 kg/m   SpO2: 96 % O2 Device: None (Room air)  GENERAL: Appears acutely ill but nontoxic.  Well-developed, overweight woman, uncomfortable but no acute respiratory distress.  Fully ambulatory.  No conversational dyspnea. HEAD: Normocephalic, atraumatic.  EYES: Pupils  equal, round, reactive to light.  No scleral icterus.  MOUTH: Oral mucosa moist.  No thrush. NECK: Supple. No thyromegaly. Trachea midline. No JVD.  No adenopathy. PULMONARY: Distant breath sounds with poor air movement bilaterally.  Coarse, otherwise no adventitious sounds. CARDIOVASCULAR: S1 and S2. Regular rate and rhythm.  No rubs, murmurs or gallops heard. ABDOMEN: Benign. MUSCULOSKELETAL: No joint deformity, no clubbing, no edema.  NEUROLOGIC: No overt focal deficit, no gait disturbance, speech is fluent. SKIN: Intact,warm,dry.  Herpes zoster eruption in the lumbosacral dermatome on the left has dried up completely and is healing. PSYCH: Mood and behavior normal.  Chest x-ray performed today showing the opacity on the right upper lobe:       Assessment & Plan:     ICD-10-CM   1. Pneumonia of right upper lobe due to infectious organism  J18.9 CT CHEST WO CONTRAST   Pneumonia with masslike density Clinically improved/resolved Check CT chest for resolution    2. Abnormal CT of the chest  R93.89    Masslike density with associated pneumonic infiltrate Check chest CT    3. COPD exacerbation (HCC)  J44.1    Continues to improve More stable now on DuoNeb and Pulmicort Continue same     Orders Placed This Encounter  Procedures   CT CHEST WO CONTRAST    Standing Status:   Future    Standing  Expiration Date:   07/24/2024    Order Specific Question:   Preferred imaging location?    Answer:   [facility]   Meds ordered this encounter  Medications   budesonide (PULMICORT) 0.25 MG/2ML nebulizer solution    Sig: Take 2 mLs (0.25 mg total) by nebulization 2 (two) times daily.    Dispense:  120 mL    Refill:  6   ipratropium-albuterol (DUONEB) 0.5-2.5 (3) MG/3ML SOLN    Sig: Take 3 mLs by nebulization every 6 (six) hours.    Dispense:  360 mL    Refill:  2   We will see the patient in follow-up in 6 to 8 weeks time she is to call sooner should any new problems arise.   Gailen Shelter, MD Advanced Bronchoscopy PCCM [facility]    *This note was dictated using voice recognition software/Dragon.  Despite best efforts to proofread, errors can occur which can change the meaning. Any transcriptional errors that result from this process are unintentional and may not be fully corrected at the time of dictation.

## 2023-07-29 NOTE — Group Note (Deleted)

## 2023-08-13 ENCOUNTER — Ambulatory Visit
Admission: RE | Admit: 2023-08-13 | Discharge: 2023-08-13 | Disposition: A | Discharge status: Home / Self Care | Payer: Medicare HMO | Source: Ambulatory Visit | Attending: Family Medicine | Admitting: Family Medicine

## 2023-08-13 DIAGNOSIS — J189 Pneumonia, unspecified organism: Secondary | ICD-10-CM | POA: Insufficient documentation

## 2023-08-26 ENCOUNTER — Telehealth: Payer: Self-pay | Admitting: Pulmonary Disease

## 2023-08-26 DIAGNOSIS — R911 Solitary pulmonary nodule: Secondary | ICD-10-CM

## 2023-08-26 DIAGNOSIS — I3139 Other pericardial effusion (noninflammatory): Secondary | ICD-10-CM

## 2023-08-26 NOTE — Telephone Encounter (Signed)
Patient is returning phone call. Patient phone number is [phone].

## 2023-08-27 NOTE — Telephone Encounter (Signed)
Patient is aware of results and voiced her understanding.  CT and echo ordered. She prefers to have echo at Dr. Windell Hummingbird office.  Nothing further needed.

## 2023-08-27 NOTE — Telephone Encounter (Signed)
Patient is returning phone call. Patient phone number is [phone].

## 2023-08-27 NOTE — Telephone Encounter (Signed)
Salena Saner, MD  P [facility] Her chest CT looks improved.  However, she will need a follow-up in 3 months to make sure that things are indeed resolving.  She does also have some fluid around her heart and she will need an echocardiogram to evaluate this.  Diagnosis: Pericardial effusion.  Lm x1 for patient.

## 2023-09-05 ENCOUNTER — Ambulatory Visit: Payer: Medicare HMO | Attending: Pulmonary Disease

## 2023-09-05 ENCOUNTER — Ambulatory Visit: Payer: Medicare HMO | Admitting: Pulmonary Disease

## 2023-09-05 ENCOUNTER — Encounter: Payer: Self-pay | Admitting: Pulmonary Disease

## 2023-09-05 VITALS — BP 128/80 | HR 110 | Temp 97.1°F | Ht 59.0 in | Wt 150.0 lb

## 2023-09-05 DIAGNOSIS — Z23 Encounter for immunization: Secondary | ICD-10-CM

## 2023-09-05 DIAGNOSIS — J439 Emphysema, unspecified: Secondary | ICD-10-CM | POA: Diagnosis not present

## 2023-09-05 DIAGNOSIS — R0602 Shortness of breath: Secondary | ICD-10-CM | POA: Insufficient documentation

## 2023-09-05 DIAGNOSIS — R9389 Abnormal findings on diagnostic imaging of other specified body structures: Secondary | ICD-10-CM | POA: Diagnosis not present

## 2023-09-05 DIAGNOSIS — I3139 Other pericardial effusion (noninflammatory): Secondary | ICD-10-CM | POA: Diagnosis not present

## 2023-09-05 DIAGNOSIS — F17210 Nicotine dependence, cigarettes, uncomplicated: Secondary | ICD-10-CM

## 2023-09-05 DIAGNOSIS — J449 Chronic obstructive pulmonary disease, unspecified: Secondary | ICD-10-CM

## 2023-09-05 DIAGNOSIS — G4736 Sleep related hypoventilation in conditions classified elsewhere: Secondary | ICD-10-CM

## 2023-09-05 LAB — PULMONARY FUNCTION TEST ARMC ONLY
DL/VA % pred: 35 %
DL/VA: 1.55 ml/min/mmHg/L
DLCO unc % pred: 35 %
DLCO unc: 5.92 ml/min/mmHg
FEF 25-75 Post: 0.51 L/s
FEF 25-75 Pre: 0.54 L/s
FEF2575-%Change-Post: -5 %
FEF2575-%Pred-Post: 27 %
FEF2575-%Pred-Pre: 28 %
FEV1-%Change-Post: -2 %
FEV1-%Pred-Post: 63 %
FEV1-%Pred-Pre: 65 %
FEV1-Post: 1.25 L
FEV1-Pre: 1.28 L
FEV1FVC-%Change-Post: 0 %
FEV1FVC-%Pred-Pre: 80 %
FEV6-%Change-Post: -1 %
FEV6-%Pred-Post: 79 %
FEV6-%Pred-Pre: 80 %
FEV6-Post: 1.96 L
FEV6-Pre: 1.99 L
FEV6FVC-%Change-Post: 1 %
FEV6FVC-%Pred-Post: 101 %
FEV6FVC-%Pred-Pre: 100 %
FVC-%Change-Post: -2 %
FVC-%Pred-Post: 78 %
FVC-%Pred-Pre: 80 %
FVC-Post: 2.02 L
FVC-Pre: 2.08 L
Post FEV1/FVC ratio: 62 %
Post FEV6/FVC ratio: 97 %
Pre FEV1/FVC ratio: 62 %
Pre FEV6/FVC Ratio: 96 %
RV % pred: 149 %
RV: 2.75 L
TLC % pred: 115 %
TLC: 4.95 L

## 2023-09-05 MED ORDER — ALBUTEROL SULFATE (2.5 MG/3ML) 0.083% IN NEBU
2.5000 mg | INHALATION_SOLUTION | Freq: Once | RESPIRATORY_TRACT | Status: AC
  Administered 2023-09-05: 2.5 mg via RESPIRATORY_TRACT
  Filled 2023-09-05: qty 3

## 2023-09-05 NOTE — Patient Instructions (Signed)
VISIT SUMMARY:  During our visit, we discussed your recent acquisition of BiPAP without being tested.  You indicated that [facility] brought this to you.  This was not ordered by [place].  Please continue your oxygen as you are doing.  Since you are not tolerating BiPAP I would recommend you discuss with your primary care physician to have it discontinued.  You have moderate to severe COPD.  Please continue using your DuoNeb 4 times a day and Pulmicort twice a day.  You can do Pulmicort right after the DuoNeb but do not mix the medications as they can foam up.  Your lung nodule appears to be getting better.  You will be scheduled for a CT scan in 3 months time.  This order has already been placed they will call you closer to the scan time.  We will see you in follow-up in 3 months time after your CT chest is done.  Sooner should any problems arise.   Provided you with flu vaccine today.

## 2023-09-05 NOTE — Progress Notes (Signed)
Subjective:    Patient ID: Krystal Lee, female    DOB: [birth year], 67 y.o.   MRN: [id number]  Patient Care Team: Armando Gang, FNP as PCP - General (Family Medicine) Armando Gang, FNP (Family Medicine)  Chief Complaint  Patient presents with   Follow-up    SOB. Wheezing. Cough with white sputum after a nebulizer treatment.   BACKGROUND/INTERVAL: 67 year old current smoker.  On the issue of recent slow to resolve COPD exacerbation, right upper lobe abnormality after COPD exacerbation and the me on the basis of emphysema.  Patient was last seen on 25 July 2023.  Had PFTs today.  HPI Discussed the use of AI scribe software for clinical note transcription with the patient, who gave verbal consent to proceed.  History of Present Illness   Patient is a 67 year old who presents for follow-up of COPD, and abnormal chest CT due to resolving infiltrate on the right upper lobe and nocturnal hypoxemia due to emphysema.  This is a scheduled visit.  The patient had PFTs performed just prior to the visit she shows an FEV1 of 1.28 L or 64% predicted, FVC of 2.08 L or 80% predicted, FEV1 FVC of 0.62.  No bronchodilator response.  There is hyperinflation and air trapping noted.  And severe diffusion capacity impairment.  Consistent with moderate to severe COPD.  These findings were discussed with the patient.  Patient presents with her 2 sisters today they are inquiring about her changes on her right upper lobe.  Most recent CT of the chest performed on 13 August 2023 showed that this process was resolving.  A follow-up in 3 months was recommended.  This CT has been ordered and is pending.  CT also showed a pericardial effusion, a 2D echo was ordered however the patient has not had this done yet.  The patient states however that she started taking her Lasix again after noting this finding.  She states that after starting the Lasix she feels markedly improved.  She is able to breathe  better.   The patient was recently started on BiPAP by primary care.  She states she did not have a test for this device.  I do not see a sleep study performed.  She is not tolerating the BiPAP and is requesting its removal.  As we did not order it she has been encouraged to reach out to primary care to have it removed.  She is already on nocturnal oxygen supplementation.    She is compliant with DuoNeb and budesonide.  She inquires whether she can mix the DuoNeb and the budesonide.  She was advised against this as these medications can foam up when mixed together.  It is recommended that she use the budesonide right after one of her DuoNeb doses.  The patient understood the instructions.    She does not endorse any other symptomatology today.     Review of Systems A 10 point review of systems was performed and it is as noted above otherwise negative.   Patient Active Problem List   Diagnosis Date Noted   Abnormal CT of the chest 07/25/2023   Schizophrenia, paranoid (HCC) 09/16/2022   Hallucinations 09/16/2022   Dyspnea 09/16/2022   Atypical chest pain 09/16/2022   Schizophrenia (HCC) 09/16/2022   Bipolar 1 disorder (HCC)    Depression    Anxiety    Hypertension    Sepsis (HCC)    HLD (hyperlipidemia)    GERD (gastroesophageal reflux disease)    Tobacco  abuse    COPD exacerbation (HCC) 07/28/2018   Acute on chronic respiratory failure with hypoxia and hypercapnia (HCC) 09/16/2017   COPD with acute exacerbation (HCC) 09/16/2017    Social History   Tobacco Use   Smoking status: Every Day    Current packs/day: 0.00    Average packs/day: 1 pack/day for 45.0 years (45.0 ttl pk-yrs)    Types: Cigarettes    Start date: 05/01/1976    Last attempt to quit: 05/01/2021    Years since quitting: 2.3   Smokeless tobacco: Never   Tobacco comments:    since age 46    5 cigarettes a day 07/25/2023 khj  Substance Use Topics   Alcohol use: Not Currently    Alcohol/week: 1.0 standard  drink of alcohol    Types: 1 Cans of beer per week    No Known Allergies  Current Meds  Medication Sig   albuterol (PROVENTIL) (2.5 MG/3ML) 0.083% nebulizer solution Take 2.5 mg by nebulization every 6 (six) hours as needed for wheezing or shortness of breath.   albuterol (VENTOLIN HFA) 108 (90 Base) MCG/ACT inhaler Inhale 2 puffs into the lungs every 4 (four) hours as needed for shortness of breath or wheezing.   ALPRAZolam (XANAX) 0.5 MG tablet Take 0.5 mg by mouth 3 (three) times daily as needed for anxiety.   budesonide (PULMICORT) 0.25 MG/2ML nebulizer solution Take 2 mLs (0.25 mg total) by nebulization 2 (two) times daily.   cephALEXin (KEFLEX) 500 MG capsule Take 1 capsule (500 mg total) by mouth 2 (two) times daily.   cholecalciferol (VITAMIN D3) 25 MCG (1000 UNIT) tablet Take 1,000 Units by mouth daily.   furosemide (LASIX) 20 MG tablet Take 1 tablet (20 mg total) by mouth daily.   ipratropium-albuterol (DUONEB) 0.5-2.5 (3) MG/3ML SOLN Take 3 mLs by nebulization every 6 (six) hours.   montelukast (SINGULAIR) 10 MG tablet Take 10 mg by mouth at bedtime.   oxyCODONE-acetaminophen (PERCOCET) 5-325 MG tablet Take 1 tablet by mouth every 4 (four) hours as needed for severe pain.   pantoprazole (PROTONIX) 40 MG tablet Take 1 tablet (40 mg total) by mouth daily after breakfast.   QUEtiapine Fumarate (SEROQUEL XR) 150 MG 24 hr tablet Take 150 mg by mouth at bedtime.   roflumilast (DALIRESP) 500 MCG TABS tablet Take 500 mcg by mouth daily.   rosuvastatin (CRESTOR) 40 MG tablet Take 40 mg by mouth at bedtime.   valsartan (DIOVAN) 80 MG tablet Take 1 tablet (80 mg total) by mouth daily.   zonisamide (ZONEGRAN) 100 MG capsule Take 1 capsule (100 mg total) by mouth at bedtime.    Immunization History  Administered Date(s) Administered   Influenza,inj,Quad PF,6+ Mos 09/17/2017   Influenza-Unspecified 08/19/2018, 09/24/2022   Pneumococcal Polysaccharide-23 09/17/2017        Objective:      BP 128/80 (BP Location: Right Arm, Cuff Size: Normal)   Pulse (!) 110   Temp (!) 97.1 F (36.2 C)   Ht 4\' 11"  (1.499 m)   Wt 150 lb (68 kg)   SpO2 98%   BMI 30.30 kg/m   SpO2: 98 % O2 Device: None (Room air)  GENERAL: Appears acutely ill but nontoxic.  Well-developed, overweight woman, uncomfortable but no acute respiratory distress.  Fully ambulatory.  No conversational dyspnea. HEAD: Normocephalic, atraumatic.  EYES: Pupils equal, round, reactive to light.  No scleral icterus.  MOUTH: Oral mucosa moist.  No thrush. NECK: Supple. No thyromegaly. Trachea midline. No JVD.  No adenopathy.  PULMONARY: Distant breath sounds with poor air movement bilaterally.  Coarse, otherwise no adventitious sounds. CARDIOVASCULAR: S1 and S2. Regular rate and rhythm.  No rubs, murmurs or gallops heard. ABDOMEN: Benign. MUSCULOSKELETAL: No joint deformity, no clubbing, no edema.  NEUROLOGIC: No overt focal deficit, no gait disturbance, speech is fluent. SKIN: Intact,warm,dry.  No rashes.. PSYCH: Mood and behavior normal.   Assessment & Plan:     ICD-10-CM   1. 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    2. Nocturnal hypoxemia due to emphysema (HCC)  J43.9    G47.36    Compliant with oxygen at 2 L/min Patient notes benefit of therapy Continue oxygen as is    3. Abnormal CT of the chest  R93.89    Will have follow-up CT in December 2024 Last CT showed masslike density on the right upper lobe improving Residual from pneumonia from COPD exacerbation    4. Pericardial effusion  I31.39    Has echo scheduled Patient states this has been a chronic issue Restarted Lasix and feels better Has upcoming cardiology appointment    5. Tobacco dependence due to cigarettes  F17.210    Patient counseled with regards to discontinuation of smoking Total counseling time 3 to 5 minutes    6. Need  for influenza vaccination  Z23    Received flu vaccine today      Orders Placed This Encounter  Procedures   Flu vaccine trivalent PF, 6mos and older(Flulaval,Afluria,Fluarix,Fluzone)   Will see the patient in follow-up in 3 months time after CT chest is done.  To contact us prior to that time should any new difficulties arise.  Gailen Shelter, MD Advanced Bronchoscopy PCCM [facility]    *This note was dictated using voice recognition software/Dragon.  Despite best efforts to proofread, errors can occur which can change the meaning. Any transcriptional errors that result from this process are unintentional and may not be fully corrected at the time of dictation.

## 2023-09-10 ENCOUNTER — Telehealth: Payer: Self-pay | Admitting: Pulmonary Disease

## 2023-09-10 NOTE — Telephone Encounter (Signed)
Ashly with [employer] called into the office trying to explain about this patient's issue. PCP Franco Nones order NIV because patient has had multiple hospital admissions this year. She has Dx of COPD and respiratory failure and the NIV is not for sleep apnea. Patient does qualify for 24/7 02. NIV was delivered on 08/27/23. Respiratory Therapist stated the patient acted like she was air hungry.  Ashly stated that if the patient refused the NIV she would need to sign a release and they would pick up the NIV

## 2023-09-10 NOTE — Telephone Encounter (Signed)
It does not matter what she qualifies for in this situation SHE WILL NOT USE IT.  She feels she is suffocating with it.

## 2023-09-27 ENCOUNTER — Ambulatory Visit: Payer: Medicare HMO | Attending: Pulmonary Disease

## 2023-09-27 DIAGNOSIS — I3139 Other pericardial effusion (noninflammatory): Secondary | ICD-10-CM

## 2023-09-27 LAB — ECHOCARDIOGRAM COMPLETE
AR max vel: 3 cm2
AV Area VTI: 2.91 cm2
AV Area mean vel: 2.98 cm2
AV Mean grad: 3 mm[Hg]
AV Peak grad: 4.8 mm[Hg]
Ao pk vel: 1.1 m/s
Area-P 1/2: 3.91 cm2
Calc EF: 60.8 %
S' Lateral: 3.3 cm
Single Plane A2C EF: 63.9 %
Single Plane A4C EF: 57.1 %

## 2023-11-04 ENCOUNTER — Emergency Department
Admission: EM | Admit: 2023-11-04 | Discharge: 2023-11-05 | Disposition: A | Discharge status: Home / Self Care | Payer: Medicare HMO | Attending: Emergency Medicine | Admitting: Emergency Medicine

## 2023-11-04 ENCOUNTER — Emergency Department: Payer: Medicare HMO

## 2023-11-04 ENCOUNTER — Other Ambulatory Visit: Payer: Self-pay

## 2023-11-04 DIAGNOSIS — W010XXA Fall on same level from slipping, tripping and stumbling without subsequent striking against object, initial encounter: Secondary | ICD-10-CM | POA: Insufficient documentation

## 2023-11-04 DIAGNOSIS — W19XXXA Unspecified fall, initial encounter: Secondary | ICD-10-CM

## 2023-11-04 DIAGNOSIS — S0990XA Unspecified injury of head, initial encounter: Secondary | ICD-10-CM | POA: Insufficient documentation

## 2023-11-04 NOTE — ED Triage Notes (Signed)
Pt reports she tripped over her oxygen cord 2 days ago and landed on her head on the floor. Pt denies taking blood thinners, pt reports she has had headache since and ringing in her ears. Pt denies numbness or weakness in extremities. Speech clear.

## 2023-11-05 MED ORDER — BUTALBITAL-APAP-CAFFEINE 50-325-40 MG PO TABS
2.0000 | ORAL_TABLET | Freq: Once | ORAL | Status: AC
  Administered 2023-11-05: 2 via ORAL
  Filled 2023-11-05: qty 2

## 2023-11-05 NOTE — ED Provider Notes (Signed)
[facility] Provider Note    Event Date/Time   First MD Initiated Contact with Patient 11/05/23 0021     (approximate)   History   Fall   HPI  Krystal Lee is a 67 y.o. female who presents to the ED for evaluation of Fall   Patient on home oxygen presents 2 days after a fall where she tripped over her O2 cord.  She had a hematoma on the left side of her head that she reports is since gone down.  She saw some commercials about people having intracranial hemorrhages after fall so she went to get checked out.   Physical Exam   Triage Vital Signs: ED Triage Vitals  Encounter Vitals Group     BP 11/04/23 2046 (!) 136/90     Systolic BP Percentile --      Diastolic BP Percentile --      Pulse Rate 11/04/23 2046 100     Resp 11/04/23 2046 18     Temp 11/04/23 2046 97.7 F (36.5 C)     Temp Source 11/04/23 2046 Oral     SpO2 11/04/23 2046 98 %     Weight 11/04/23 2045 146 lb (66.2 kg)     Height 11/04/23 2045 4\' 11"  (1.499 m)     Head Circumference --      Peak Flow --      Pain Score 11/04/23 2045 10     Pain Loc --      Pain Education --      Exclude from Growth Chart --     Most recent vital signs: Vitals:   11/04/23 2046 11/05/23 0054  BP: (!) 136/90 118/86  Pulse: 100 94  Resp: 18 17  Temp: 97.7 F (36.5 C)   SpO2: 98% 98%    General: Awake, no distress.  No appreciable hematoma.  No laceration or scalp injury. CV:  Good peripheral perfusion.  Resp:  Normal effort.  Abd:  No distention.  MSK:  No deformity noted.  Neuro:  No focal deficits appreciated. Cranial nerves II through XII intact 5/5 strength and sensation in all 4 extremities Other:     ED Results / Procedures / Treatments   Labs (all labs ordered are listed, but only abnormal results are displayed) Labs Reviewed - No data to display  EKG   RADIOLOGY CT head interpreted by me without evidence of acute intracranial pathology  Official radiology  report(s): CT HEAD WO CONTRAST ( ) Result Date: 11/04/2023 CLINICAL DATA:  Head trauma EXAM: CT HEAD WITHOUT CONTRAST TECHNIQUE: Contiguous axial images were obtained from the base of the skull through the vertex without intravenous contrast. RADIATION DOSE REDUCTION: This exam was performed according to the departmental dose-optimization program which includes automated exposure control, adjustment of the mA and/or kV according to patient size and/or use of iterative reconstruction technique. COMPARISON:  09/16/2022 FINDINGS: Brain: No mass,hemorrhage or extra-axial collection. Normal appearance of the parenchyma and CSF spaces. Vascular: Atherosclerotic calcification of the internal carotid arteries at the skull base. No abnormal hyperdensity of the major intracranial arteries or dural venous sinuses. Skull: The visualized skull base, calvarium and extracranial soft tissues are normal. Sinuses/Orbits: No fluid levels or advanced mucosal thickening of the visualized paranasal sinuses. No mastoid or middle ear effusion. Normal orbits. IMPRESSION: No acute intracranial abnormality. Electronically Signed   By: Deatra Robinson M.D.   On: 11/04/2023 22:32    PROCEDURES and INTERVENTIONS:  Procedures  Medications  butalbital-acetaminophen-caffeine (FIORICET)  50-325-40 MG per tablet 2 tablet (2 tablets Oral Given 11/05/23 0048)     IMPRESSION / MDM / ASSESSMENT AND PLAN / ED COURSE  I reviewed the triage vital signs and the nursing notes.  Differential diagnosis includes, but is not limited to, ICH, skull fracture, concussion  {Patient presents with symptoms of an acute illness or injury that is potentially life-threatening.  Patient presents 2 days after a head injury without evidence of acute pathology and suitable for outpatient management.  Reassuring exam without signs of additional trauma or neurologic deficits.  Reassuring imaging.  Discussed return precautions and expectant management.       FINAL CLINICAL IMPRESSION(S) / ED DIAGNOSES   Final diagnoses:  Fall, initial encounter     Rx / DC Orders   ED Discharge Orders     None        Note:  This document was prepared using Dragon voice recognition software and may include unintentional dictation errors.   Delton [place], MD 11/05/23 [phone]

## 2023-11-05 NOTE — Discharge Instructions (Signed)
Please take Tylenol and ibuprofen/Advil for your pain.  It is safe to take them together, or to alternate them every few hours.  Take up to 1000mg of Tylenol at a time, up to 4 times per day.  Do not take more than 4000 mg of Tylenol in 24 hours.  For ibuprofen, take 400-600 mg, 3 - 4 times per day.  

## 2023-11-28 ENCOUNTER — Ambulatory Visit
Admission: RE | Admit: 2023-11-28 | Discharge: 2023-11-28 | Disposition: A | Discharge status: Home / Self Care | Payer: Medicare HMO | Source: Ambulatory Visit | Attending: Pulmonary Disease | Admitting: Pulmonary Disease

## 2023-11-28 DIAGNOSIS — R911 Solitary pulmonary nodule: Secondary | ICD-10-CM | POA: Diagnosis present

## 2023-11-29 ENCOUNTER — Ambulatory Visit: Admission: RE | Admit: 2023-11-29 | Payer: Medicare HMO | Source: Ambulatory Visit

## 2023-12-10 ENCOUNTER — Ambulatory Visit: Payer: Medicare HMO | Admitting: Pulmonary Disease

## 2023-12-12 ENCOUNTER — Encounter: Payer: Self-pay | Admitting: Pulmonary Disease

## 2023-12-12 ENCOUNTER — Ambulatory Visit: Payer: Medicare HMO | Admitting: Pulmonary Disease

## 2023-12-12 VITALS — BP 118/78 | HR 112 | Temp 96.9°F | Ht 59.0 in | Wt 150.4 lb

## 2023-12-12 DIAGNOSIS — B0229 Other postherpetic nervous system involvement: Secondary | ICD-10-CM

## 2023-12-12 DIAGNOSIS — J439 Emphysema, unspecified: Secondary | ICD-10-CM

## 2023-12-12 DIAGNOSIS — J441 Chronic obstructive pulmonary disease with (acute) exacerbation: Secondary | ICD-10-CM

## 2023-12-12 DIAGNOSIS — F17210 Nicotine dependence, cigarettes, uncomplicated: Secondary | ICD-10-CM

## 2023-12-12 DIAGNOSIS — R9389 Abnormal findings on diagnostic imaging of other specified body structures: Secondary | ICD-10-CM

## 2023-12-12 DIAGNOSIS — J449 Chronic obstructive pulmonary disease, unspecified: Secondary | ICD-10-CM | POA: Insufficient documentation

## 2023-12-12 DIAGNOSIS — G4736 Sleep related hypoventilation in conditions classified elsewhere: Secondary | ICD-10-CM

## 2023-12-12 HISTORY — DX: Other postherpetic nervous system involvement: B02.29

## 2023-12-12 HISTORY — DX: Emphysema, unspecified: J43.9

## 2023-12-12 MED ORDER — PREDNISONE 10 MG (21) PO TBPK: ORAL_TABLET | ORAL | 0 refills | Status: DC

## 2023-12-12 NOTE — Patient Instructions (Signed)
VISIT SUMMARY:  You came in today for a follow-up visit. We discussed your recent shingles diagnosis, ongoing COPD management, and recent pneumonia treatment. You reported improvement in your shortness of breath but mentioned dizziness and balance issues. We also reviewed your recent CT scan results and discussed your smoking habits.  YOUR PLAN:  -CHRONIC OBSTRUCTIVE PULMONARY DISEASE (COPD): COPD is a chronic lung condition that makes it hard to breathe. You reported improvement in your breathing but still have dizziness and balance issues, which might be due to dehydration or the after-effects of shingles. We prescribed another taper pack of prednisone and scheduled a PET scan in a couple of weeks to check for inflammation or other issues. It's very important to stop smoking to prevent further lung damage.  -NOCTURNAL HYPOXEMIA: Nocturnal hypoxemia means low oxygen levels during sleep, likely due to your COPD. We did not make any changes to your current management plan for this issue today.  -ABNORMAL CHEST CT: Your recent chest CT showed evolving scar tissue, which could be due to lingering pneumonia or scarring. We have scheduled a PET scan in a couple of weeks to help determine the cause. The ongoing smoking complicates the assessment, so stopping smoking is crucial.  -RECURRENT SHINGLES WITH POSTHERPETIC NEURALGIA: You have had shingles more than once in the same area, and now you have nerve pain from it, known as postherpetic neuralgia. Since there are no active lesions, the pain is from nerve damage, not an active infection. We discussed pain management options for this condition.  -GENERAL HEALTH MAINTENANCE: We advised you to drink more fluids to help with potential dehydration and strongly recommended that you stop smoking to improve your lung health.  INSTRUCTIONS:  Please follow up in 3-4 weeks. We have scheduled a PET scan in a couple of weeks to further investigate the issues with  your lungs.

## 2023-12-12 NOTE — Progress Notes (Signed)
Subjective:    Patient ID: Krystal Lee, female    DOB: [birth year], 68 y.o.   MRN: [id number]  Patient Care Team: Armando Gang, FNP as PCP - General (Family Medicine) Armando Gang, FNP (Family Medicine)  Chief Complaint  Patient presents with   Follow-up    DOE. Wheezing. Cough with white sputum.     BACKGROUND/INTERVAL: 68 year old current smoker. 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 Patient was last seen on 05 September 2023.  Had CT chest 28 November 2023.  Experiencing issues with postherpetic neuralgia.  HPI Discussed the use of AI scribe software for clinical note transcription with the patient, who gave verbal consent to proceed.  History of Present Illness   The patient, with a history of moderate to severe COPD, nocturnal hypoxemia, and ongoing tobacco use, presents for follow-up. She reports a recent diagnosis of relapsing shingles, by her primary care provider. The patient notes that the shingles occurred in the same location as a previous outbreak and has left her with persistent nerve pain. She also reports a recent treatment for pneumonia.  She has just recently completed antibiotics.  The patient notes improvement in her shortness of breath but reports ongoing dizziness and balance issues. She attributes these symptoms to the shingles, but also mentions potential dehydration, noting that her urine is very yellow.  The patient admits to continued smoking, although she attempted to reduce her intake during a recent illness. She expresses understanding of the detrimental effects of smoking on her lung health.  The patient also mentions a recent course of Levaquin and a short course of prednisone. She reports feeling better overall, but still experiences some symptoms. She expresses concern about a lung area identified in a recent CT scan,  which is still under investigation.   I reviewed the patient's most recent CT chest of 28 November 2023 and compared to her prior CT chest of 13 August 2023.  The films were shown to the patient and her sister who is present with her today.  They had ample Trelegy to ask questions and these were answered to their satisfaction.   Review of Systems A 10 point review of systems was performed and it is as noted above otherwise negative.   Patient Active Problem List   Diagnosis Date Noted   Abnormal CT of the chest 07/25/2023   Schizophrenia, paranoid (HCC) 09/16/2022   Hallucinations 09/16/2022   Dyspnea 09/16/2022   Atypical chest pain 09/16/2022   Schizophrenia (HCC) 09/16/2022   Bipolar 1 disorder (HCC)    Depression    Anxiety    Hypertension    Sepsis (HCC)    HLD (hyperlipidemia)    GERD (gastroesophageal reflux disease)    Tobacco abuse    COPD exacerbation (HCC) 07/28/2018   Acute on chronic respiratory failure with hypoxia and hypercapnia (HCC) 09/16/2017   COPD with acute exacerbation (HCC) 09/16/2017    Social History   Tobacco Use   Smoking status: Every Day    Current packs/day: 0.00    Average packs/day: 1 pack/day for 45.0 years (45.0 ttl pk-yrs)    Types: Cigarettes    Start date: 05/01/1976    Last attempt to quit: 05/01/2021    Years since quitting: 2.6   Smokeless tobacco: Never   Tobacco comments:    since age 47    10 cigarettes a day  khj  12/12/2023  Substance Use Topics   Alcohol use: Not Currently    Alcohol/week: 1.0 standard drink of alcohol    Types: 1 Cans of beer per week    No Known Allergies  Current Meds  Medication Sig   albuterol (VENTOLIN HFA) 108 (90 Base) MCG/ACT inhaler Inhale 2 puffs into the lungs every 4 (four) hours as needed for shortness of breath or wheezing.   ALPRAZolam (XANAX) 0.5 MG tablet Take 0.5 mg by mouth 3 (three) times daily as needed for anxiety.   budesonide (PULMICORT) 0.25 MG/2ML nebulizer solution Take 2 mLs  (0.25 mg total) by nebulization 2 (two) times daily.   cholecalciferol (VITAMIN D3) 25 MCG (1000 UNIT) tablet Take 1,000 Units by mouth daily.   furosemide (LASIX) 20 MG tablet Take 1 tablet (20 mg total) by mouth daily.   ipratropium-albuterol (DUONEB) 0.5-2.5 (3) MG/3ML SOLN Take 3 mLs by nebulization every 6 (six) hours.   montelukast (SINGULAIR) 10 MG tablet Take 10 mg by mouth at bedtime.   oxyCODONE-acetaminophen (PERCOCET) 5-325 MG tablet Take 1 tablet by mouth every 4 (four) hours as needed for severe pain.   pantoprazole (PROTONIX) 40 MG tablet Take 1 tablet (40 mg total) by mouth daily after breakfast.   predniSONE (STERAPRED UNI-PAK 21 TAB) 10 MG (21) TBPK tablet Take as directed in the package   QUEtiapine Fumarate (SEROQUEL XR) 150 MG 24 hr tablet Take 150 mg by mouth at bedtime.   roflumilast (DALIRESP) 500 MCG TABS tablet Take 500 mcg by mouth daily.   rosuvastatin (CRESTOR) 40 MG tablet Take 40 mg by mouth at bedtime.   valsartan (DIOVAN) 80 MG tablet Take 1 tablet (80 mg total) by mouth daily.   zonisamide (ZONEGRAN) 100 MG capsule Take 1 capsule (100 mg total) by mouth at bedtime.    Immunization History  Administered Date(s) Administered   Influenza, Seasonal, Injecte, Preservative Fre 09/05/2023   Influenza,inj,Quad PF,6+ Mos 09/17/2017   Influenza-Unspecified 08/19/2018, 09/24/2022   Pneumococcal Polysaccharide-23 09/17/2017      Objective:     BP 118/78 (BP Location: Right Arm, Cuff Size: Normal)   Pulse (!) 112   Temp (!) 96.9 F (36.1 C)   Ht 4\' 11"  (1.499 m)   Wt 150 lb 6.4 oz (68.2 kg)   SpO2 99%   BMI 30.38 kg/m   SpO2: 99 % O2 Device: None (Room air)  GENERAL: Appears acutely ill but nontoxic.  Well-developed, overweight woman, uncomfortable but no acute respiratory distress.  Fully ambulatory.  No conversational dyspnea. HEAD: Normocephalic, atraumatic.  EYES: Pupils equal, round, reactive to light.  No scleral icterus.  MOUTH: Oral mucosa moist.   No thrush. NECK: Supple. No thyromegaly. Trachea midline. No JVD.  No adenopathy. PULMONARY: Distant breath sounds with poor air movement bilaterally.  Coarse, diffuse wheezing throughout. CARDIOVASCULAR: S1 and S2. Regular rate and rhythm.  No rubs, murmurs or gallops heard. ABDOMEN: Benign. MUSCULOSKELETAL: No joint deformity, no clubbing, no edema.  NEUROLOGIC: No overt focal deficit, no gait disturbance, speech is fluent. SKIN: Intact,warm,dry.  No rashes.. PSYCH: Mood and behavior normal.   Representative images from CTs obtained since August 2024: 1) 06/2023, 2) 07/2023, 3) 11/2023    Assessment & Plan:     ICD-10-CM   1. COPD, moderate (HCC) Moderate to severe  J44.9     2. COPD exacerbation (HCC)  J44.1     3. Abnormal CT of the chest  R93.89 NM PET Image Initial (PI) Skull Base To Thigh (F-18  FDG)   Spiculated opacity RUL    4. Nocturnal hypoxemia due to emphysema (HCC)  J43.9    G47.36     5. Post herpetic neuralgia  B02.29     6. Tobacco dependence due to cigarettes  F17.210      Meds ordered this encounter  Medications   predniSONE (STERAPRED UNI-PAK 21 TAB) 10 MG (21) TBPK tablet    Sig: Take as directed in the package    Dispense:  21 tablet    Refill:  0   Discussion:    Chronic Obstructive Pulmonary Disease (COPD) with Exacerbation Moderate to severe COPD with recent exacerbation. Reports improvement in dyspnea but continues to experience dizziness and balance issues, possibly related to dehydration or residual effects from shingles, query effect from Levaquin. Ongoing tobacco use exacerbates COPD severity. Discussed the importance of smoking cessation to prevent further lung damage and improve overall health. - Prescribe another taper pack of prednisone - Schedule PET scan in a couple of weeks to assess for inflammation or other pathology - Encourage smoking cessation  Nocturnal Hypoxemia Related to COPD. No specific changes in management discussed  during this visit.  Patient compliant with therapy, advised to continue  Abnormal Chest CT Abnormal chest CT with evolving scar tissue. Differential includes lingering pneumonia versus scarring. PET scan planned to differentiate between inflammation and other potential causes. Explained that the evolving nature of the scar suggests an inflammatory process, but ongoing smoking complicates the assessment. - Order PET scan in a couple of weeks  Recurrent Shingles with Postherpetic Neuralgia Recurrent shingles in the same anatomical location with residual nerve pain. No active vesicular lesions noted, indicating postherpetic neuralgia rather than an active infection. Discussed that antiviral medication is only effective if started within the first three days of symptoms. Current symptoms are due to nerve pain, not an active infection. - Consider pain management options for postherpetic neuralgia  General Health Maintenance Advised to increase fluid intake to address potential dehydration. Strongly recommended smoking cessation to improve overall lung health. - Encourage increased fluid intake - Strongly encourage smoking cessation  Follow-up - Follow-up in 3-4 weeks.     Advised if symptoms do not improve or worsen, to please contact office for sooner follow up or seek emergency care.    I spent 40 minutes of dedicated to the care of this patient on the date of this encounter to include pre-visit review of records, face-to-face time with the patient discussing conditions above, post visit ordering of testing, clinical documentation with the electronic health record, making appropriate referrals as documented, and communicating necessary findings to members of the patients care team.     C. Danice Goltz, MD Advanced Bronchoscopy PCCM [facility]    *This note was generated using voice recognition software/Dragon and/or AI transcription program.  Despite best efforts to  proofread, errors can occur which can change the meaning. Any transcriptional errors that result from this process are unintentional and may not be fully corrected at the time of dictation.

## 2023-12-19 ENCOUNTER — Ambulatory Visit
Admission: RE | Admit: 2023-12-19 | Discharge: 2023-12-19 | Disposition: A | Discharge status: Home / Self Care | Payer: Medicare HMO | Source: Ambulatory Visit | Attending: Pulmonary Disease | Admitting: Pulmonary Disease

## 2023-12-19 DIAGNOSIS — I3139 Other pericardial effusion (noninflammatory): Secondary | ICD-10-CM | POA: Diagnosis not present

## 2023-12-19 DIAGNOSIS — R9389 Abnormal findings on diagnostic imaging of other specified body structures: Secondary | ICD-10-CM | POA: Insufficient documentation

## 2023-12-19 DIAGNOSIS — J189 Pneumonia, unspecified organism: Secondary | ICD-10-CM | POA: Diagnosis not present

## 2023-12-19 DIAGNOSIS — I700 Atherosclerosis of aorta: Secondary | ICD-10-CM | POA: Insufficient documentation

## 2023-12-19 DIAGNOSIS — J439 Emphysema, unspecified: Secondary | ICD-10-CM | POA: Diagnosis not present

## 2023-12-19 LAB — GLUCOSE, CAPILLARY: Glucose-Capillary: 81 mg/dL (ref 70–99)

## 2023-12-19 MED ORDER — FLUDEOXYGLUCOSE F - 18 (FDG) INJECTION
8.4200 | Freq: Once | INTRAVENOUS | Status: AC | PRN
  Administered 2023-12-19: 8.42 via INTRAVENOUS

## 2024-01-02 ENCOUNTER — Encounter: Payer: Self-pay | Admitting: Pulmonary Disease

## 2024-01-02 ENCOUNTER — Ambulatory Visit (INDEPENDENT_AMBULATORY_CARE_PROVIDER_SITE_OTHER): Payer: Medicare HMO | Admitting: Pulmonary Disease

## 2024-01-02 ENCOUNTER — Telehealth: Payer: Self-pay

## 2024-01-02 VITALS — BP 138/82 | HR 113 | Temp 97.1°F | Ht 59.0 in | Wt 152.2 lb

## 2024-01-02 DIAGNOSIS — J439 Emphysema, unspecified: Secondary | ICD-10-CM

## 2024-01-02 DIAGNOSIS — R918 Other nonspecific abnormal finding of lung field: Secondary | ICD-10-CM | POA: Diagnosis not present

## 2024-01-02 DIAGNOSIS — J449 Chronic obstructive pulmonary disease, unspecified: Secondary | ICD-10-CM

## 2024-01-02 DIAGNOSIS — G4736 Sleep related hypoventilation in conditions classified elsewhere: Secondary | ICD-10-CM

## 2024-01-02 DIAGNOSIS — F17210 Nicotine dependence, cigarettes, uncomplicated: Secondary | ICD-10-CM

## 2024-01-02 NOTE — H&P (View-Only) (Signed)
 Subjective:    Patient ID: Vollie Aaron, female    DOB: 1956-05-30, 68 y.o.   MRN: [id number]  Patient Care Team: Armando Gang, FNP as PCP - General (Family Medicine) Armando Gang, FNP (Family Medicine)  Chief Complaint  Patient presents with   Follow-up    DOE. Wheezing. Dry cough.     BACKGROUND/INTERVAL:68 year-old current smoker.  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 Patient was last seen on 12 December 2023.  Had CT chest 28 November 2023.  At that time recommended PET/CT.  Patient has been reluctant to undergo biopsies.  HPI Discussed the use of AI scribe software for clinical note transcription with the patient, who gave verbal consent to proceed.  History of Present Illness   Greg Elda Dunkerson is a 68 year old female with a pulmonary opacity who presents for evaluation of its progression and consideration of a biopsy.  She presents with her sister.  The patient has a history of a pulmonary opacity initially identified in August, which has shown changes in shape and size over time. Initially thought to be related to pneumonia, recent imaging has shown areas of activity on a PET scan, raising concerns for inflammation, infection, or malignancy. The opacity has changed shape and some areas have disappeared, but it continues to show activity on imaging.  She is not currently using inhalers regularly but occasionally uses Trelegy in the morning(not recommended). She is also using nebulizers, including Duoneb and Pulmicort. She has requested prednisone as it helps her breathe better, although she acknowledges previous discussions about not using it long-term.  In terms of her social history, she mentions having quit smoking for three days but resumed smoking about half a pack a day after receiving the PET scan results. She is trying  to maintain physical activity, getting in steps daily, although she sometimes runs out of time to complete her routine.   Review of Systems A 10 point review of systems was performed and it is as noted above otherwise negative.   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 12/12/2023   Nocturnal hypoxemia due to emphysema (HCC) 12/12/2023   Tobacco dependence due to cigarettes 12/12/2023   Abnormal CT of the chest 07/25/2023   Schizophrenia, paranoid (HCC) 09/16/2022   Hallucinations 09/16/2022   Dyspnea 09/16/2022   Atypical chest pain 09/16/2022   Schizophrenia (HCC) 09/16/2022   Bipolar 1 disorder (HCC)    Depression    Anxiety    Hypertension    Sepsis (HCC)    HLD (hyperlipidemia)    GERD (gastroesophageal reflux disease)    Tobacco abuse    COPD exacerbation (HCC) 07/28/2018   Acute on chronic respiratory failure with hypoxia and hypercapnia (HCC) 09/16/2017   COPD with acute exacerbation (HCC) 09/16/2017    Social History   Tobacco Use   Smoking status: Every Day    Current packs/day: 0.00    Average packs/day: 1 pack/day for 45.0 years (45.0 ttl pk-yrs)    Types: Cigarettes    Start date: 05/01/1976    Last attempt to quit: 05/01/2021    Years since quitting: 2.6   Smokeless tobacco: Never   Tobacco comments:    Started smoking at 68 yrs old.    10 cigarettes a day  khj 01/02/2024  Substance Use Topics   Alcohol use: Not Currently    Alcohol/week: 1.0 standard drink of alcohol    Types: 1 Cans of beer per week    No Known Allergies  Current Meds  Medication Sig   albuterol (PROVENTIL) (2.5 MG/3ML) 0.083% nebulizer solution Take 2.5 mg by nebulization every 6 (six) hours as needed for wheezing or shortness of breath.   ALPRAZolam (XANAX) 0.5 MG tablet Take 0.5 mg by mouth 3 (three) times daily as needed for anxiety.   budesonide (PULMICORT) 0.25 MG/2ML nebulizer solution  Take 2 mLs (0.25 mg total) by nebulization 2 (two) times daily.   cholecalciferol (VITAMIN D3) 25 MCG (1000 UNIT) tablet Take 1,000 Units by mouth daily.   furosemide (LASIX) 20 MG tablet Take 1 tablet (20 mg total) by mouth daily.   gabapentin (NEURONTIN) 300 MG capsule Take 300 mg by mouth daily.   ibuprofen (ADVIL) 600 MG tablet Take 600 mg by mouth 2 (two) times daily as needed.   ipratropium-albuterol (DUONEB) 0.5-2.5 (3) MG/3ML SOLN Take 3 mLs by nebulization every 6 (six) hours.   montelukast (SINGULAIR) 10 MG tablet Take 10 mg by mouth at bedtime.   oxyCODONE-acetaminophen (PERCOCET) 5-325 MG tablet Take 1 tablet by mouth every 4 (four) hours as needed for severe pain.   pantoprazole (PROTONIX) 40 MG tablet Take 1 tablet (40 mg total) by mouth daily after breakfast.   QUEtiapine Fumarate (SEROQUEL XR) 150 MG 24 hr tablet Take 150 mg by mouth at bedtime.   rosuvastatin (CRESTOR) 40 MG tablet Take 40 mg by mouth at bedtime.   valsartan (DIOVAN) 80 MG tablet Take 1 tablet (80 mg total) by mouth daily.   zonisamide (ZONEGRAN) 100 MG capsule Take 1 capsule (100 mg total) by mouth at bedtime.    Immunization History  Administered Date(s) Administered   Influenza, Seasonal, Injecte, Preservative Fre 09/05/2023   Influenza,inj,Quad PF,6+ Mos 09/17/2017   Influenza-Unspecified 08/19/2018, 09/24/2022   Pneumococcal Polysaccharide-23 09/17/2017        Objective:     BP 138/82 (BP Location: Right Arm, Cuff Size: Normal)   Pulse (!) 113   Temp (!) 97.1 F (36.2 C)   Ht 4\' 11"  (1.499 m)   Wt 152 lb 3.2 oz (69 kg)   SpO2 97%   BMI 30.74 kg/m   SpO2: 97 % O2 Device: None (Room air)  GENERAL: Appears acutely ill but nontoxic.  Well-developed, overweight woman, uncomfortable but no acute respiratory distress.  Fully ambulatory.  No conversational dyspnea. HEAD: Normocephalic, atraumatic.  EYES: Pupils equal, round, reactive to light.  No scleral icterus.  MOUTH: Oral mucosa moist.   No thrush. NECK: Supple. No thyromegaly. Trachea midline. No JVD.  No adenopathy. PULMONARY: Distant breath sounds with poor air movement bilaterally.  Coarse, diffuse wheezing throughout. CARDIOVASCULAR: S1 and S2. Regular rate and rhythm.  No rubs, murmurs or gallops heard. ABDOMEN: Benign. MUSCULOSKELETAL: No joint deformity, no clubbing, no edema.  NEUROLOGIC: No overt focal deficit, no gait disturbance, speech is fluent. SKIN: Intact,warm,dry.  No rashes.. PSYCH: Mood and behavior normal.  Reviewed the PET/CT with the patient and her sister.  They were shown the films and the areas of increased activity.  There was no mediastinal activity.      Assessment & Plan:     ICD-10-CM   1. Abnormal CT of the chest  R93.89     2. 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 Nocturnal  hypoxemia due to emphysema (HCC)  J43.9    G47.36     4. Tobacco dependence due to cigarettes  F17.210       Orders Placed This Encounter  Procedures   CT SUPER D CHEST WO [facility] PILOT    Standing Status:   Future    Expiration Date:   01/01/2025    Scheduling Instructions:     Please do before 14 January 2024.  Patient has robotic assisted navigational bronchoscopy on 25 February.    Preferred imaging location?:   [facility]   Discussion:    Pulmonary Opacity The pulmonary opacity has shown changes in shape and activity on imaging since August. PET scan indicates areas of activity, potentially representing intense inflammation, cancer, or infection. No lymph node or other organ involvement. Differential diagnosis includes organizing pneumonia, cancer, and infection. Patient prefers to wait and repeat imaging in two months despite concerns about biopsy spreading cancer. Explained biopsy does not cause cancer spread and is necessary for definitive diagnosis. Discussed the need for further evaluation due to nodule's changing characteristics. - After discussion patient agrees to proceed  with biopsy - Proceed with robotic assisted navigational bronchoscopy  Chronic Obstructive Pulmonary Disease (COPD) Patient with COPD uses nebulizers and Trelegy, finding nebulizers particularly helpful (Pulmicort and Duoneb). Recently resumed smoking after concerning scan results. Discussed benefits of continuing nebulizer treatments and increasing Pulmicort strength. Advised discontinuing Trelegy if using nebulizers. Encouraged smoking cessation. - Increase strength of Pulmicort - Continue nebulizer treatments - Discontinue Trelegy if using nebulizers - Encourage smoking cessation  General Health Maintenance Patient is due for a shingles vaccination, delayed pending pulmonary evaluation outcome. - Discuss shingles vaccination with primary care  Follow-up - Schedule follow-up appointment after the procedure on 25 February.       Advised if symptoms do not improve or worsen, to please contact office for sooner follow up or seek emergency care.    I spent 40 minutes of dedicated to the care of this patient on the date of this encounter to include pre-visit review of records, face-to-face time with the patient discussing conditions above, post visit ordering of testing, clinical documentation with the electronic health record, making appropriate referrals as documented, and communicating necessary findings to members of the patients care team.     C. Danice Goltz, MD Advanced Bronchoscopy PCCM [facility]    *This note was generated using voice recognition software/Dragon and/or AI transcription program.  Despite best efforts to proofread, errors can occur which can change the meaning. Any transcriptional errors that result from this process are unintentional and may not be fully corrected at the time of dictation.

## 2024-01-02 NOTE — Telephone Encounter (Signed)
Pre Admit phone interview- 2/19 1p-5p.  The patient is aware.

## 2024-01-02 NOTE — Telephone Encounter (Signed)
Robotic Bronch with EBUS 01/14/2024 at 12:30pm Lung Nodule [id number], [id number], [id number]  Synetta Fail please see Bronch info.

## 2024-01-02 NOTE — Patient Instructions (Addendum)
VISIT SUMMARY:  During today's visit, we discussed the progression of your pulmonary nodule and the next steps for its evaluation. We also reviewed your COPD management and general health maintenance.  YOUR PLAN:  -PULMONARY INFILTRATE: A pulmonary infiltrate in the lung that be due to various causes such as inflammation, infection, or cancer. Your nodule has shown changes in shape and activity on imaging. We will schedule a follow-up scan in two months to monitor it, and if necessary, we may need to perform a biopsy to get a definitive diagnosis.  -CHRONIC OBSTRUCTIVE PULMONARY DISEASE (COPD): COPD is a chronic lung condition that makes it hard to breathe. You are currently using nebulizers and Trelegy, but we recommend increasing the strength of Pulmicort and continuing with the nebulizers. If you are using nebulizers, you should discontinue Trelegy. It is also important to stop smoking to help manage your COPD.  -GENERAL HEALTH MAINTENANCE: You are due for a shingles vaccination, which we will administer once we have more information about your pulmonary evaluation.  INSTRUCTIONS:  Please schedule a follow-up appointment after your procedure on February 25th. Additionally, we will schedule a follow-up scan in two months to monitor your pulmonary nodule.

## 2024-01-02 NOTE — Progress Notes (Signed)
Subjective:    Patient ID: Krystal Lee, female    DOB: 1956-05-30, 68 y.o.   MRN: [id number]  Patient Care Team: Armando Gang, FNP as PCP - General (Family Medicine) Armando Gang, FNP (Family Medicine)  Chief Complaint  Patient presents with   Follow-up    DOE. Wheezing. Dry cough.     BACKGROUND/INTERVAL:68 year-old current smoker.  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 Patient was last seen on 12 December 2023.  Had CT chest 28 November 2023.  At that time recommended PET/CT.  Patient has been reluctant to undergo biopsies.  HPI Discussed the use of AI scribe software for clinical note transcription with the patient, who gave verbal consent to proceed.  History of Present Illness   Krystal Lee is a 68 year old female with a pulmonary opacity who presents for evaluation of its progression and consideration of a biopsy.  She presents with her sister.  The patient has a history of a pulmonary opacity initially identified in August, which has shown changes in shape and size over time. Initially thought to be related to pneumonia, recent imaging has shown areas of activity on a PET scan, raising concerns for inflammation, infection, or malignancy. The opacity has changed shape and some areas have disappeared, but it continues to show activity on imaging.  She is not currently using inhalers regularly but occasionally uses Trelegy in the morning(not recommended). She is also using nebulizers, including Duoneb and Pulmicort. She has requested prednisone as it helps her breathe better, although she acknowledges previous discussions about not using it long-term.  In terms of her social history, she mentions having quit smoking for three days but resumed smoking about half a pack a day after receiving the PET scan results. She is trying  to maintain physical activity, getting in steps daily, although she sometimes runs out of time to complete her routine.   Review of Systems A 10 point review of systems was performed and it is as noted above otherwise negative.   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 12/12/2023   Nocturnal hypoxemia due to emphysema (HCC) 12/12/2023   Tobacco dependence due to cigarettes 12/12/2023   Abnormal CT of the chest 07/25/2023   Schizophrenia, paranoid (HCC) 09/16/2022   Hallucinations 09/16/2022   Dyspnea 09/16/2022   Atypical chest pain 09/16/2022   Schizophrenia (HCC) 09/16/2022   Bipolar 1 disorder (HCC)    Depression    Anxiety    Hypertension    Sepsis (HCC)    HLD (hyperlipidemia)    GERD (gastroesophageal reflux disease)    Tobacco abuse    COPD exacerbation (HCC) 07/28/2018   Acute on chronic respiratory failure with hypoxia and hypercapnia (HCC) 09/16/2017   COPD with acute exacerbation (HCC) 09/16/2017    Social History   Tobacco Use   Smoking status: Every Day    Current packs/day: 0.00    Average packs/day: 1 pack/day for 45.0 years (45.0 ttl pk-yrs)    Types: Cigarettes    Start date: 05/01/1976    Last attempt to quit: 05/01/2021    Years since quitting: 2.6   Smokeless tobacco: Never   Tobacco comments:    Started smoking at 68 yrs old.    10 cigarettes a day  khj 01/02/2024  Substance Use Topics   Alcohol use: Not Currently    Alcohol/week: 1.0 standard drink of alcohol    Types: 1 Cans of beer per week    No Known Allergies  Current Meds  Medication Sig   albuterol (PROVENTIL) (2.5 MG/3ML) 0.083% nebulizer solution Take 2.5 mg by nebulization every 6 (six) hours as needed for wheezing or shortness of breath.   ALPRAZolam (XANAX) 0.5 MG tablet Take 0.5 mg by mouth 3 (three) times daily as needed for anxiety.   budesonide (PULMICORT) 0.25 MG/2ML nebulizer solution  Take 2 mLs (0.25 mg total) by nebulization 2 (two) times daily.   cholecalciferol (VITAMIN D3) 25 MCG (1000 UNIT) tablet Take 1,000 Units by mouth daily.   furosemide (LASIX) 20 MG tablet Take 1 tablet (20 mg total) by mouth daily.   gabapentin (NEURONTIN) 300 MG capsule Take 300 mg by mouth daily.   ibuprofen (ADVIL) 600 MG tablet Take 600 mg by mouth 2 (two) times daily as needed.   ipratropium-albuterol (DUONEB) 0.5-2.5 (3) MG/3ML SOLN Take 3 mLs by nebulization every 6 (six) hours.   montelukast (SINGULAIR) 10 MG tablet Take 10 mg by mouth at bedtime.   oxyCODONE-acetaminophen (PERCOCET) 5-325 MG tablet Take 1 tablet by mouth every 4 (four) hours as needed for severe pain.   pantoprazole (PROTONIX) 40 MG tablet Take 1 tablet (40 mg total) by mouth daily after breakfast.   QUEtiapine Fumarate (SEROQUEL XR) 150 MG 24 hr tablet Take 150 mg by mouth at bedtime.   rosuvastatin (CRESTOR) 40 MG tablet Take 40 mg by mouth at bedtime.   valsartan (DIOVAN) 80 MG tablet Take 1 tablet (80 mg total) by mouth daily.   zonisamide (ZONEGRAN) 100 MG capsule Take 1 capsule (100 mg total) by mouth at bedtime.    Immunization History  Administered Date(s) Administered   Influenza, Seasonal, Injecte, Preservative Fre 09/05/2023   Influenza,inj,Quad PF,6+ Mos 09/17/2017   Influenza-Unspecified 08/19/2018, 09/24/2022   Pneumococcal Polysaccharide-23 09/17/2017        Objective:     BP 138/82 (BP Location: Right Arm, Cuff Size: Normal)   Pulse (!) 113   Temp (!) 97.1 F (36.2 C)   Ht 4\' 11"  (1.499 m)   Wt 152 lb 3.2 oz (69 kg)   SpO2 97%   BMI 30.74 kg/m   SpO2: 97 % O2 Device: None (Room air)  GENERAL: Appears acutely ill but nontoxic.  Well-developed, overweight woman, uncomfortable but no acute respiratory distress.  Fully ambulatory.  No conversational dyspnea. HEAD: Normocephalic, atraumatic.  EYES: Pupils equal, round, reactive to light.  No scleral icterus.  MOUTH: Oral mucosa moist.   No thrush. NECK: Supple. No thyromegaly. Trachea midline. No JVD.  No adenopathy. PULMONARY: Distant breath sounds with poor air movement bilaterally.  Coarse, diffuse wheezing throughout. CARDIOVASCULAR: S1 and S2. Regular rate and rhythm.  No rubs, murmurs or gallops heard. ABDOMEN: Benign. MUSCULOSKELETAL: No joint deformity, no clubbing, no edema.  NEUROLOGIC: No overt focal deficit, no gait disturbance, speech is fluent. SKIN: Intact,warm,dry.  No rashes.. PSYCH: Mood and behavior normal.  Reviewed the PET/CT with the patient and her sister.  They were shown the films and the areas of increased activity.  There was no mediastinal activity.      Assessment & Plan:     ICD-10-CM   1. Abnormal CT of the chest  R93.89     2. 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 Nocturnal  hypoxemia due to emphysema (HCC)  J43.9    G47.36     4. Tobacco dependence due to cigarettes  F17.210       Orders Placed This Encounter  Procedures   CT SUPER D CHEST WO [facility] PILOT    Standing Status:   Future    Expiration Date:   01/01/2025    Scheduling Instructions:     Please do before 14 January 2024.  Patient has robotic assisted navigational bronchoscopy on 25 February.    Preferred imaging location?:   [facility]   Discussion:    Pulmonary Opacity The pulmonary opacity has shown changes in shape and activity on imaging since August. PET scan indicates areas of activity, potentially representing intense inflammation, cancer, or infection. No lymph node or other organ involvement. Differential diagnosis includes organizing pneumonia, cancer, and infection. Patient prefers to wait and repeat imaging in two months despite concerns about biopsy spreading cancer. Explained biopsy does not cause cancer spread and is necessary for definitive diagnosis. Discussed the need for further evaluation due to nodule's changing characteristics. - After discussion patient agrees to proceed  with biopsy - Proceed with robotic assisted navigational bronchoscopy  Chronic Obstructive Pulmonary Disease (COPD) Patient with COPD uses nebulizers and Trelegy, finding nebulizers particularly helpful (Pulmicort and Duoneb). Recently resumed smoking after concerning scan results. Discussed benefits of continuing nebulizer treatments and increasing Pulmicort strength. Advised discontinuing Trelegy if using nebulizers. Encouraged smoking cessation. - Increase strength of Pulmicort - Continue nebulizer treatments - Discontinue Trelegy if using nebulizers - Encourage smoking cessation  General Health Maintenance Patient is due for a shingles vaccination, delayed pending pulmonary evaluation outcome. - Discuss shingles vaccination with primary care  Follow-up - Schedule follow-up appointment after the procedure on 25 February.       Advised if symptoms do not improve or worsen, to please contact office for sooner follow up or seek emergency care.    I spent 40 minutes of dedicated to the care of this patient on the date of this encounter to include pre-visit review of records, face-to-face time with the patient discussing conditions above, post visit ordering of testing, clinical documentation with the electronic health record, making appropriate referrals as documented, and communicating necessary findings to members of the patients care team.     C. Danice Goltz, MD Advanced Bronchoscopy P[facility]    *This note was generated using voice recognition software/Dragon and/or AI transcription program.  Despite best efforts to proofread, errors can occur which can change the meaning. Any transcriptional errors that result from this process are unintentional and may not be fully corrected at the time of dictation.

## 2024-01-06 NOTE — Telephone Encounter (Signed)
For the codes 86578, [id number], [phone] Prior Auth Not Required Refer # [id number]

## 2024-01-06 NOTE — Telephone Encounter (Signed)
 Noted. Nothing further needed.

## 2024-01-08 ENCOUNTER — Encounter
Admission: RE | Admit: 2024-01-08 | Discharge: 2024-01-08 | Disposition: A | Discharge status: Home / Self Care | Payer: Medicare HMO | Source: Ambulatory Visit | Attending: Pulmonary Disease | Admitting: Pulmonary Disease

## 2024-01-08 ENCOUNTER — Other Ambulatory Visit: Payer: Self-pay

## 2024-01-08 HISTORY — DX: Sepsis, unspecified organism: A41.9

## 2024-01-08 HISTORY — DX: Thrombocytopenia, unspecified: D69.6

## 2024-01-08 NOTE — Pre-Procedure Instructions (Signed)
Unable to reach the patient the sister Guilford Shi was available to be a historian for the Pre-Admit interview. The printed instructions will be mailed to the patient today.Aurther Loft was given the Pre-Admit number to provide to Shamona if she had any questions that needed to be answered. Aurther Loft verbalized understanding.

## 2024-01-08 NOTE — Patient Instructions (Addendum)
Your procedure is scheduled on:  Tuesday February 25 Report to the Registration Desk on the 1st floor of the [facility] To find out your arrival time, please call [phone] between 1PM - 3PM on:  Monday February 24 If your arrival time is 6:00 am, do not arrive before that time as the [facility] entrance doors do not open until 6:00 am.  REMEMBER: Instructions that are not followed completely may result in serious medical risk, up to and including death; or upon the discretion of your surgeon and anesthesiologist your surgery may need to be rescheduled.  Do not eat food after midnight the night before surgery.  No gum chewing or hard candies.  One week prior to surgery: Starting Tuesday February 18  Stop Anti-inflammatories (NSAIDS) such as Advil, Aleve, Ibuprofen, Motrin, Naproxen, Naprosyn and Aspirin based products such as Excedrin, Goody's Powder, BC Powder. Stop ANY OVER THE COUNTER supplements until after surgery. cholecalciferol (VITAMIN D3)  ibuprofen (ADVIL)   You may however, continue to take Tylenol if needed for pain up until the day of surgery.  Continue taking all of your other prescription medications up until the day of surgery.  ON THE DAY OF SURGERY ONLY TAKE THESE MEDICATIONS WITH SIPS OF WATER:  pantoprazole (PROTONIX)   Use inhalers on the day of surgery and bring to the hospital. albuterol (PROVENTIL)  albuterol (VENTOLIN HFA)   No Alcohol for 24 hours before or after surgery.  No Smoking including e-cigarettes for 24 hours before surgery.  No chewable tobacco products for at least 6 hours before surgery.  No nicotine patches on the day of surgery.  Do not use any "recreational" drugs for at least a week (preferably 2 weeks) before your surgery.  Please be advised that the combination of cocaine and anesthesia may have negative outcomes, up to and including death. If you test positive for cocaine, your surgery will be cancelled.  On the morning  of surgery brush your teeth with toothpaste and water, you may rinse your mouth with mouthwash if you wish. Do not swallow any toothpaste or mouthwash.  Do not wear jewelry, make-up, hairpins, clips or nail polish.  For welded (permanent) jewelry: bracelets, anklets, waist bands, etc.  Please have this removed prior to surgery.  If it is not removed, there is a chance that hospital personnel will need to cut it off on the day of surgery.  Do not wear lotions, powders, or perfumes.   Do not shave body hair from the neck down 48 hours before surgery.  Contact lenses, hearing aids and dentures may not be worn into surgery.  Do not bring valuables to the hospital. [facility] is not responsible for any missing/lost belongings or valuables.   Notify your doctor if there is any change in your medical condition (cold, fever, infection).  Wear comfortable clothing (specific to your surgery type) to the hospital.  After surgery, you can help prevent lung complications by doing breathing exercises.  Take deep breaths and cough every 1-2 hours.  If you are being discharged the day of surgery, you will not be allowed to drive home. You will need a responsible individual to drive you home and stay with you for 24 hours after surgery.   If you are taking public transportation, you will need to have a responsible individual with you.  Please call the Pre-admissions Testing Dept. at [phone] if you have any questions about these instructions.  Surgery Visitation Policy:  Patients having surgery  or a procedure may have two visitors.  Children under the age of 55 must have an adult with them who is not the patient.  Temporary Visitor Restrictions Due to increasing cases of flu, RSV and COVID-19: Children ages 83 and under will not be able to visit patients in [facility] hospitals under most circumstances.

## 2024-01-10 NOTE — Pre-Procedure Instructions (Signed)
Patient called asking about what medications to take on the morning of her procedure , instructions reviewed and she has no other questions at this time.

## 2024-01-13 ENCOUNTER — Ambulatory Visit
Admission: RE | Admit: 2024-01-13 | Discharge: 2024-01-13 | Disposition: A | Discharge status: Home / Self Care | Payer: Medicare HMO | Source: Ambulatory Visit | Attending: Pulmonary Disease | Admitting: Pulmonary Disease

## 2024-01-13 DIAGNOSIS — R918 Other nonspecific abnormal finding of lung field: Secondary | ICD-10-CM | POA: Insufficient documentation

## 2024-01-14 ENCOUNTER — Ambulatory Visit
Admission: RE | Admit: 2024-01-14 | Discharge: 2024-01-14 | Disposition: A | Discharge status: Home / Self Care | Payer: Medicare HMO | Attending: Pulmonary Disease | Admitting: Pulmonary Disease

## 2024-01-14 ENCOUNTER — Ambulatory Visit: Payer: Medicare HMO | Admitting: Certified Registered"

## 2024-01-14 ENCOUNTER — Encounter: Payer: Self-pay | Admitting: Pulmonary Disease

## 2024-01-14 ENCOUNTER — Encounter
Admission: RE | Disposition: A | Discharge status: Home / Self Care | Payer: Self-pay | Source: Home / Self Care | Attending: Pulmonary Disease

## 2024-01-14 ENCOUNTER — Ambulatory Visit: Payer: Medicare HMO

## 2024-01-14 ENCOUNTER — Other Ambulatory Visit: Payer: Self-pay

## 2024-01-14 DIAGNOSIS — I3139 Other pericardial effusion (noninflammatory): Secondary | ICD-10-CM | POA: Insufficient documentation

## 2024-01-14 DIAGNOSIS — F17210 Nicotine dependence, cigarettes, uncomplicated: Secondary | ICD-10-CM | POA: Diagnosis not present

## 2024-01-14 DIAGNOSIS — R9389 Abnormal findings on diagnostic imaging of other specified body structures: Secondary | ICD-10-CM

## 2024-01-14 DIAGNOSIS — R911 Solitary pulmonary nodule: Secondary | ICD-10-CM | POA: Diagnosis present

## 2024-01-14 DIAGNOSIS — K219 Gastro-esophageal reflux disease without esophagitis: Secondary | ICD-10-CM | POA: Diagnosis not present

## 2024-01-14 DIAGNOSIS — Z79899 Other long term (current) drug therapy: Secondary | ICD-10-CM | POA: Insufficient documentation

## 2024-01-14 DIAGNOSIS — J449 Chronic obstructive pulmonary disease, unspecified: Secondary | ICD-10-CM | POA: Diagnosis not present

## 2024-01-14 DIAGNOSIS — R918 Other nonspecific abnormal finding of lung field: Secondary | ICD-10-CM

## 2024-01-14 DIAGNOSIS — I10 Essential (primary) hypertension: Secondary | ICD-10-CM | POA: Diagnosis not present

## 2024-01-14 DIAGNOSIS — J439 Emphysema, unspecified: Secondary | ICD-10-CM | POA: Insufficient documentation

## 2024-01-14 DIAGNOSIS — F418 Other specified anxiety disorders: Secondary | ICD-10-CM | POA: Insufficient documentation

## 2024-01-14 DIAGNOSIS — G4736 Sleep related hypoventilation in conditions classified elsewhere: Secondary | ICD-10-CM | POA: Diagnosis not present

## 2024-01-14 DIAGNOSIS — F319 Bipolar disorder, unspecified: Secondary | ICD-10-CM | POA: Diagnosis not present

## 2024-01-14 DIAGNOSIS — R942 Abnormal results of pulmonary function studies: Secondary | ICD-10-CM | POA: Diagnosis present

## 2024-01-14 HISTORY — PX: ENDOBRONCHIAL ULTRASOUND: SHX5096

## 2024-01-14 SURGERY — BRONCHOSCOPY, WITH BIOPSY USING ELECTROMAGNETIC NAVIGATION
Anesthesia: General | Laterality: Right

## 2024-01-14 MED ORDER — MIDAZOLAM HCL 2 MG/2ML IJ SOLN
INTRAMUSCULAR | Status: AC
  Filled 2024-01-14: qty 2

## 2024-01-14 MED ORDER — LIDOCAINE HCL (PF) 2 % IJ SOLN
INTRAMUSCULAR | Status: AC
  Filled 2024-01-14: qty 5

## 2024-01-14 MED ORDER — ROCURONIUM BROMIDE 100 MG/10ML IV SOLN
INTRAVENOUS | Status: DC | PRN
  Administered 2024-01-14: 50 mg via INTRAVENOUS

## 2024-01-14 MED ORDER — PHENOL 1.4 % MT LIQD: 1.0000 | OROMUCOSAL | Status: DC | PRN

## 2024-01-14 MED ORDER — IPRATROPIUM-ALBUTEROL 0.5-2.5 (3) MG/3ML IN SOLN
RESPIRATORY_TRACT | Status: AC
  Filled 2024-01-14: qty 3

## 2024-01-14 MED ORDER — PROPOFOL 10 MG/ML IV BOLUS
INTRAVENOUS | Status: DC | PRN
  Administered 2024-01-14: 130 mg via INTRAVENOUS

## 2024-01-14 MED ORDER — ROCURONIUM BROMIDE 10 MG/ML (PF) SYRINGE
PREFILLED_SYRINGE | INTRAVENOUS | Status: AC
  Filled 2024-01-14: qty 10

## 2024-01-14 MED ORDER — SODIUM CHLORIDE 0.9 % IV SOLN: Freq: Once | INTRAVENOUS | Status: AC

## 2024-01-14 MED ORDER — OXYCODONE HCL 5 MG/5ML PO SOLN: 5.0000 mg | Freq: Once | ORAL | Status: DC | PRN

## 2024-01-14 MED ORDER — SUGAMMADEX SODIUM 200 MG/2ML IV SOLN
INTRAVENOUS | Status: DC | PRN
  Administered 2024-01-14: 200 mg via INTRAVENOUS

## 2024-01-14 MED ORDER — FENTANYL CITRATE (PF) 100 MCG/2ML IJ SOLN
INTRAMUSCULAR | Status: AC
  Filled 2024-01-14: qty 2

## 2024-01-14 MED ORDER — PHENYLEPHRINE 80 MCG/ML (10ML) SYRINGE FOR IV PUSH (FOR BLOOD PRESSURE SUPPORT)
PREFILLED_SYRINGE | INTRAVENOUS | Status: DC | PRN
  Administered 2024-01-14: 80 ug via INTRAVENOUS
  Administered 2024-01-14: 40 ug via INTRAVENOUS
  Administered 2024-01-14 (×2): 80 ug via INTRAVENOUS
  Administered 2024-01-14: 40 ug via INTRAVENOUS

## 2024-01-14 MED ORDER — FENTANYL CITRATE (PF) 100 MCG/2ML IJ SOLN
INTRAMUSCULAR | Status: DC | PRN
  Administered 2024-01-14 (×2): 50 ug via INTRAVENOUS

## 2024-01-14 MED ORDER — LIDOCAINE HCL (CARDIAC) PF 100 MG/5ML IV SOSY
PREFILLED_SYRINGE | INTRAVENOUS | Status: DC | PRN
  Administered 2024-01-14: 80 mg via INTRAVENOUS

## 2024-01-14 MED ORDER — CHLORHEXIDINE GLUCONATE 0.12 % MT SOLN
OROMUCOSAL | Status: AC
  Filled 2024-01-14: qty 15

## 2024-01-14 MED ORDER — MIDAZOLAM HCL 2 MG/2ML IJ SOLN
INTRAMUSCULAR | Status: DC | PRN
  Administered 2024-01-14: 2 mg via INTRAVENOUS

## 2024-01-14 MED ORDER — CHLORHEXIDINE GLUCONATE 0.12 % MT SOLN
15.0000 mL | Freq: Once | OROMUCOSAL | Status: AC
  Administered 2024-01-14: 15 mL via OROMUCOSAL

## 2024-01-14 MED ORDER — OXYCODONE HCL 5 MG PO TABS: 5.0000 mg | ORAL_TABLET | Freq: Once | ORAL | Status: DC | PRN

## 2024-01-14 MED ORDER — IPRATROPIUM-ALBUTEROL 0.5-2.5 (3) MG/3ML IN SOLN
3.0000 mL | Freq: Once | RESPIRATORY_TRACT | Status: AC
  Administered 2024-01-14: 3 mL via RESPIRATORY_TRACT

## 2024-01-14 MED ORDER — FENTANYL CITRATE (PF) 100 MCG/2ML IJ SOLN: 25.0000 ug | INTRAMUSCULAR | Status: DC | PRN

## 2024-01-14 MED ORDER — ORAL CARE MOUTH RINSE: 15.0000 mL | Freq: Once | OROMUCOSAL | Status: AC

## 2024-01-14 MED ORDER — PROPOFOL 1000 MG/100ML IV EMUL
INTRAVENOUS | Status: AC
  Filled 2024-01-14: qty 100

## 2024-01-14 MED ORDER — ONDANSETRON HCL 4 MG/2ML IJ SOLN
INTRAMUSCULAR | Status: DC | PRN
  Administered 2024-01-14: 4 mg via INTRAVENOUS

## 2024-01-14 NOTE — Anesthesia Preprocedure Evaluation (Addendum)
 Anesthesia Evaluation  Patient identified by MRN, date of birth, ID band Patient awake    Reviewed: Allergy & Precautions, NPO status , Patient's Chart, lab work & pertinent test results  History of Anesthesia Complications Negative for: history of anesthetic complications  Airway Mallampati: III  TM Distance: >3 FB Neck ROM: full    Dental  (+) Poor Dentition   Pulmonary shortness of breath, COPD,  COPD inhaler, Current Smoker Lung nodule    Pulmonary exam normal        Cardiovascular hypertension, Normal cardiovascular exam  Echo 09/2023  IMPRESSIONS     1. Left ventricular ejection fraction, by estimation, is 55 to 60%. The  left ventricle has normal function. The left ventricle has no regional  wall motion abnormalities. Left ventricular diastolic parameters are  consistent with Grade I diastolic  dysfunction (impaired relaxation).   2. Right ventricular systolic function is normal. The right ventricular  size is normal.   3. A small pericardial effusion is present. The pericardial effusion is  anterior to the right ventricle.   4. The mitral valve is normal in structure. Trivial mitral valve  regurgitation.   5. The aortic valve was not well visualized. Aortic valve regurgitation  is not visualized.   6. The inferior vena cava is normal in size with greater than 50%  respiratory variability, suggesting right atrial pressure of 3 mmHg.      Neuro/Psych  Headaches PSYCHIATRIC DISORDERS Anxiety Depression Bipolar Disorder Schizophrenia     GI/Hepatic Neg liver ROS,GERD  ,,  Endo/Other  negative endocrine ROS    Renal/GU      Musculoskeletal   Abdominal   Peds  Hematology negative hematology ROS (+)   Anesthesia Other Findings Past Medical History: 07/25/2023: Abnormal CT of the chest 09/16/2017: Acute on chronic respiratory failure with hypoxia and  hypercapnia (HCC) No date: Anxiety No date:  Arthritis     Comment:  knees No date: Bipolar 1 disorder (HCC) No date: COPD (chronic obstructive pulmonary disease) (HCC) No date: Depression No date: GERD (gastroesophageal reflux disease) No date: Headache     Comment:  during allergy season No date: Hepatitis C virus 07/05/2023: HLD (hyperlipidemia) No date: Hypertension 12/12/2023: Nocturnal hypoxemia due to emphysema (HCC) 12/12/2023: Post herpetic neuralgia No date: Post-menopausal No date: Schizoaffective disorder (HCC) 09/16/2022: Schizophrenia (HCC) No date: Sepsis (HCC) No date: Thrombocytopenia (HCC)     Comment:  Dr. Cathie Hoops oncologist  Past Surgical History: 2010: CATARACT EXTRACTION     Comment:  right  06/09/2019: CATARACT EXTRACTION W/PHACO; Left     Comment:  Procedure: CATARACT EXTRACTION PHACO AND INTRAOCULAR               LENS PLACEMENT (IOC) COMPLICATED  LEFT;  Surgeon:               [name], MD;  Location: [facility];                Service: Ophthalmology;  Laterality: Left;  VISION BLUE                OM[facility] 08/31/2013: LOWER EXTREMITY ANGIOGRAPHY; Bilateral     Comment:  [facility], Dr. [name]     Reproductive/Obstetrics negative OB ROS                              Anesthesia Physical Anesthesia Plan  ASA: 3  Anesthesia Plan: General ETT   Post-op Pain Management:  Minimal or no pain anticipated   Induction: Intravenous  PONV Risk Score and Plan: 3 and Ondansetron, Dexamethasone, Midazolam and Treatment may vary due to age or medical condition  Airway Management Planned: Oral ETT  Additional Equipment:   Intra-op Plan:   Post-operative Plan: Extubation in OR  Informed Consent: I have reviewed the patients History and Physical, chart, labs and discussed the procedure including the risks, benefits and alternatives for the proposed anesthesia with the patient or authorized representative who has indicated his/her understanding and acceptance.     Dental  Advisory Given  Plan Discussed with: Anesthesiologist, CRNA and Surgeon  Anesthesia Plan Comments: (Patient consented for risks of anesthesia including but not limited to:  - adverse reactions to medications - damage to eyes, teeth, lips or other oral mucosa - nerve damage due to positioning  - sore throat or hoarseness - Damage to heart, brain, nerves, lungs, other parts of body or loss of life  Patient voiced understanding and assent.)         Anesthesia Quick Evaluation

## 2024-01-14 NOTE — Op Note (Signed)
 PROCEDURES  Robotic assisted bronchoscopy Cellvizio probe based confocal laser endomicroscopy (pCLE) Endobronchial ultrasound examination of the mediastinum   Indication: 68 year old current smoker with a right upper lobe opacity with irregular nodular components and spiculated margins, poorly characterized, rule out cancer versus organizing pneumonia  Preoperative Diagnosis: Lung cancer versus organizing pneumonia Post Procedure Diagnosis: Same as above Consent: Verbal/Written: obtained  Benefits, limitations and potential complications of the procedure were discussed with the patient/family.  Complications from bronchoscopy are rare and most often minor, but if they occur they may include breathing difficulty, vocal cord spasm, hoarseness, slight fever, vomiting, dizziness, bronchospasm, infection, low blood oxygen, bleeding from biopsy site, or an allergic reaction to medications.  It is uncommon for patients to experience other more serious complications for example: Collapsed lung requiring chest tube placement, respiratory failure, heart attack and/or cardiac arrhythmia.  Patient understood the potential complications and agreed to proceed.  Surgeon: Gailen Shelter, MD Assistant/Scrub: Everlean Alstrom, RRT Circulator: N/A Anesthesiologist/CRNA: Stephanie Coup, MD/Crystal Alonza Smoker, CRNA Cytotechnology: Verdell Carmine, team lead  Fluoroscopy technician: Cheryll Dessert, RT Representatives: Daphine Deutscher, Tim Lair ([employer])  Type of Anesthesia: General endotracheal  Procedures Performed:   Robotic bronchoscopy: Procedure consists of robotic navigation comprised of electromagnetics, optical pattern recognition and robotic kinematic data - to triangulate bronchoscope location during the procedure and provide accurate positional data to biopsy a lesion. Cellvizio probe based confocal laser endomicroscopy (pCLE) utilizing blue laser endomicroscopy. Inspection of the mediastinum with  endobronchial ultrasound  Description of Procedure:  Robotic bronchoscopy: The patient was brought to Procedure Room 2 (Bronchoscopy Suite) in the OR area where appropriate timeout was taken with the staff after the patient was inducted under general anesthesia.  The patient was inducted under general anesthesia and intubated by the anesthesia team.  Patient was intubated with a 8.0 ET tube without difficulty.  Tube was secured at 4 cm above the carina.  A Portex adapter was placed on the ET tube flange.  Once the patient was under adequate general anesthesia the Olympus therapeutic video bronchoscope was advanced and an anatomic airway tour and surveillance bronchoscopy was performed.The distal trachea appeared unremarkable. The main carina was sharp.  No secretions were seen in either right or left mainstem bronchi. The RUL, RLL, RML appeared to be free of endobronchial masses, lesions, or purulent secretions. Likewise, the LLL/LUL appeared to be free of endobronchial masses, lesions, or purulent secretions.  There were some scant benign appearing secretions on the right middle lobe that were suctioned until cleared.  Once the survey bronchoscopy was completed,the robotic bronchoscope ET tube adapter was placed and ETT was cut to proper length and secured on the mid plane.  The [facility] robotic scope was then advanced through the ETT and registration was performed successfully.  There was good correlation between the robotic mapping and bronchoscopic mapping. With the assistance of fused navigation, the bronchoscope was advanced to the RUL masslike opacity. The tip of the working channel sat within 18 mm of the center of the lesion.  Positioning was confirmed with fluoroscopy.  At this point Cellvizio probe based confocal laser endomicroscopy (pCLE) was utilized to confirm target acquisition.  The images through [facility] were consistent with area of interest changes.  Fluoroscopy was utilized during sampling.   A total of 10 transbronchial biopsies were obtained at this point, the lesion could be seen moving during biopsies.  ROSE was consistent with lesional cells after confirmation of excellent hemostasis, 2 passes with a cytology brush on the RUL opacity  were performed, the brushes were cut into CytoLyt preservative.  A BAL was also obtained at this segment, which sent for cytology analysis.  For BAL sample acquisition purposes, 20 ml of normal saline were instilled, and approximately 12 ml were recovered/trapped and sent for analysis.   The robotic bronchoscope was then retracted all the way out after confirmation of excellent hemostasis.  At this point the endobronchial ultrasound scope was advanced and the mediastinum was examined.  Precarinal, subcarinal, hilar areas were examined none showing significant adenopathy.  Having completed this the patient received bronchial lavage with 9 mL of 1% lidocaine via the ET tube.  At this point the bronchoscope was retrieved and the procedure was terminated.  The patient tolerated the procedure well. No significant bleeding was observed at the conclusion of the procedure.  At this point, the patient was allowed to emerge from general anesthesia, and was extubated in the procedure room without incident.  The patient  was taken to the PACU in satisfactory condition.  Auscultation of the lungs showed no change from pre bronchoscopy examination.  Patient tolerated the procedure well with no untoward effects of anesthesia noted.   Specimens Obtained:  Transbronchial Forceps Biopsy: X 10, RUL  Transbronchial Brush: X 2 RUL  Targeted BAL: 12 mL aliquot, RUL  Fluoroscopy: Fluoroscopy was utilized during the course of this procedure to assure that biopsies were taken in a safe manner under fluoroscopic guidance with spot films required.  Total fluoroscopy time: 2 minutes 59 seconds, total dose 23.65 mGy  Intraoperative images:  Fluoroscopy confirmed proper position for  biopsy:     Robotic bronchoscope at target:   Cellvizio image:   Complications:None, no pneumothorax on post film:   Estimated Blood Loss: Nil    Assessment and Plan/Additional Comments: Right upper lobe masslike opacity, rule out cancer, status post technically successful robotic assisted navigational bronchoscopy Await pathology reports Patient has appropriate pulmonary follow-up Appropriate referrals will be made once pathology results known   C. Danice Goltz, MD Advanced Bronchoscopy PCCM [facility]    *This note was dictated using voice recognition software/Dragon.  Despite best efforts to proofread, errors can occur which can change the meaning.  Any change was purely unintentional.

## 2024-01-14 NOTE — Interval H&P Note (Signed)
 Krystal Lee has presented today for surgery, with the diagnosis of RIGHT UPPER LOBE MASSLIKE OPACITY.  The various methods of treatment have been discussed with the patient and family. After consideration of risks, benefits and other options for treatment, the patient has consented to  Procedure(s): ROBOTIC ASSISTED NAVIGATIONAL BRONCHOSCOPY-RIGHT as a surgical intervention.  The patient's history has been reviewed, patient examined, no change in status, stable for surgery.  I have reviewed the patient's chart and labs.  Questions were answered to the patient's satisfaction.  Benefits, limitations and potential complications of the procedure were discussed with the patient/family.  Complications from bronchoscopy are rare and most often minor, but if they occur they may include breathing difficulty, vocal cord spasm, hoarseness, slight fever, vomiting, dizziness, bronchospasm, infection, low blood oxygen, bleeding from biopsy site, or an allergic reaction to medications.  It is uncommon for patients to experience other more serious complications for example: Collapsed lung requiring chest tube placement, respiratory failure, heart attack and/or cardiac arrhythmia.  Patient agrees to proceed.  Gailen Shelter, MD Advanced Bronchoscopy PCCM [facility]    *This note was generated using voice recognition software/Dragon and/or AI transcription program.  Despite best efforts to proofread, errors can occur which can change the meaning. Any transcriptional errors that result from this process are unintentional and may not be fully corrected at the time of dictation.

## 2024-01-14 NOTE — Anesthesia Procedure Notes (Signed)
 Procedure Name: Intubation Date/Time: 01/14/2024 12:44 PM  Performed by: Elmarie Mainland, CRNAPre-anesthesia Checklist: Patient identified, Emergency Drugs available, Suction available and Patient being monitored Patient Re-evaluated:Patient Re-evaluated prior to induction Oxygen Delivery Method: Circle system utilized Preoxygenation: Pre-oxygenation with 100% oxygen Induction Type: IV induction Ventilation: Mask ventilation without difficulty Laryngoscope Size: McGrath and 3 Grade View: Grade I Tube type: Oral Tube size: 8.0 mm Number of attempts: 1 Airway Equipment and Method: Stylet and Video-laryngoscopy Placement Confirmation: ETT inserted through vocal cords under direct vision, positive ETCO2 and breath sounds checked- equal and bilateral Secured at: 21 cm Tube secured with: Tape

## 2024-01-14 NOTE — Anesthesia Postprocedure Evaluation (Signed)
 Anesthesia Post Note  Patient: Kellina Dreese  Procedure(s) Performed: ROBOTIC ASSISTED NAVIGATIONAL BRONCHOSCOPY (Right) ENDOBRONCHIAL ULTRASOUND (Right)  Patient location during evaluation: PACU Anesthesia Type: General Level of consciousness: awake and alert Pain management: pain level controlled Vital Signs Assessment: post-procedure vital signs reviewed and stable Respiratory status: spontaneous breathing, nonlabored ventilation, respiratory function stable and patient connected to nasal cannula oxygen Cardiovascular status: blood pressure returned to baseline and stable Postop Assessment: no apparent nausea or vomiting Anesthetic complications: no  No notable events documented.   Last Vitals:  Vitals:   01/14/24 1415 01/14/24 1430  BP: 133/77 110/66  Pulse: (!) 102 94  Resp: (!) 27   Temp:  (!) 36.1 C  SpO2: 99% 95%    Last Pain:  Vitals:   01/14/24 1430  TempSrc:   PainSc: 0-No pain                 Stephanie Coup

## 2024-01-14 NOTE — Transfer of Care (Signed)
 Immediate Anesthesia Transfer of Care Note  Patient: Krystal Lee  Procedure(s) Performed: ROBOTIC ASSISTED NAVIGATIONAL BRONCHOSCOPY (Right) ENDOBRONCHIAL ULTRASOUND (Right)  Patient Location: PACU  Anesthesia Type:General  Level of Consciousness: awake and patient cooperative  Airway & Oxygen Therapy: Patient Spontanous Breathing and Patient connected to face mask oxygen  Post-op Assessment: Report given to RN and Post -op Vital signs reviewed and stable  Post vital signs: Reviewed and stable  Last Vitals:  Vitals Value Taken Time  BP 126/83 01/14/24 1357  Temp 36.1 C 01/14/24 1357  Pulse 104 01/14/24 1357  Resp 26 01/14/24 1357  SpO2 100 % 01/14/24 1357  Vitals shown include unfiled device data.  Last Pain:  Vitals:   01/14/24 1357  TempSrc:   PainSc: 0-No pain         Complications: No notable events documented.

## 2024-01-15 ENCOUNTER — Encounter: Payer: Self-pay | Admitting: Pulmonary Disease

## 2024-01-15 LAB — CYTOLOGY - NON PAP

## 2024-01-15 LAB — SURGICAL PATHOLOGY

## 2024-01-16 ENCOUNTER — Other Ambulatory Visit: Payer: Self-pay | Admitting: Pulmonary Disease

## 2024-01-16 DIAGNOSIS — J8489 Other specified interstitial pulmonary diseases: Secondary | ICD-10-CM

## 2024-01-16 MED ORDER — PREDNISONE 10 MG PO TABS: ORAL_TABLET | ORAL | 1 refills | Status: DC

## 2024-01-16 NOTE — Progress Notes (Signed)
 Biopsies of the right upper lobe process were consistent with organizing pneumonia.  Showing only inflammation.  No dense of malignancy.  Will start patient on prednisone.   Gailen Shelter, MD Advanced Bronchoscopy PCCM [facility]

## 2024-01-17 ENCOUNTER — Telehealth: Payer: Self-pay | Admitting: Pulmonary Disease

## 2024-01-17 NOTE — Telephone Encounter (Signed)
 Patient received a call but doesn't know who called. She thinks it might have been about the results of her lung biopsy. If so please call patient back.

## 2024-01-17 NOTE — Telephone Encounter (Signed)
 Patient advised of lab results. Nothing further needed.

## 2024-01-23 ENCOUNTER — Emergency Department

## 2024-01-23 ENCOUNTER — Emergency Department
Admission: EM | Admit: 2024-01-23 | Discharge: 2024-01-24 | Disposition: A | Discharge status: Home / Self Care | Attending: Emergency Medicine | Admitting: Emergency Medicine

## 2024-01-23 ENCOUNTER — Encounter: Payer: Self-pay | Admitting: Emergency Medicine

## 2024-01-23 ENCOUNTER — Other Ambulatory Visit: Payer: Self-pay

## 2024-01-23 DIAGNOSIS — R091 Pleurisy: Secondary | ICD-10-CM | POA: Insufficient documentation

## 2024-01-23 DIAGNOSIS — J441 Chronic obstructive pulmonary disease with (acute) exacerbation: Secondary | ICD-10-CM | POA: Diagnosis not present

## 2024-01-23 DIAGNOSIS — J988 Other specified respiratory disorders: Secondary | ICD-10-CM | POA: Diagnosis not present

## 2024-01-23 DIAGNOSIS — R059 Cough, unspecified: Secondary | ICD-10-CM | POA: Diagnosis not present

## 2024-01-23 DIAGNOSIS — Z87891 Personal history of nicotine dependence: Secondary | ICD-10-CM | POA: Insufficient documentation

## 2024-01-23 DIAGNOSIS — R0602 Shortness of breath: Secondary | ICD-10-CM | POA: Diagnosis present

## 2024-01-23 LAB — CBC
HCT: 37 % (ref 36.0–46.0)
Hemoglobin: 12.7 g/dL (ref 12.0–15.0)
MCH: 31.4 pg (ref 26.0–34.0)
MCHC: 34.3 g/dL (ref 30.0–36.0)
MCV: 91.6 fL (ref 80.0–100.0)
Platelets: 267 10*3/uL (ref 150–400)
RBC: 4.04 MIL/uL (ref 3.87–5.11)
RDW: 14.8 % (ref 11.5–15.5)
WBC: 7.4 10*3/uL (ref 4.0–10.5)
nRBC: 0 % (ref 0.0–0.2)

## 2024-01-23 LAB — BASIC METABOLIC PANEL
Anion gap: 10 (ref 5–15)
BUN: 14 mg/dL (ref 8–23)
CO2: 22 mmol/L (ref 22–32)
Calcium: 9.6 mg/dL (ref 8.9–10.3)
Chloride: 108 mmol/L (ref 98–111)
Creatinine, Ser: 0.61 mg/dL (ref 0.44–1.00)
GFR, Estimated: >60 mL/min (ref >60)
Glucose, Bld: 113 mg/dL — ABNORMAL HIGH (ref 70–99)
Potassium: 3.9 mmol/L (ref 3.5–5.1)
Sodium: 140 mmol/L (ref 135–145)

## 2024-01-23 LAB — RESP PANEL BY RT-PCR (RSV, FLU A&B, COVID)  RVPGX2
Influenza A by PCR: NEGATIVE
Influenza B by PCR: NEGATIVE
Resp Syncytial Virus by PCR: NEGATIVE
SARS Coronavirus 2 by RT PCR: NEGATIVE

## 2024-01-23 LAB — TROPONIN I (HIGH SENSITIVITY)
Troponin I (High Sensitivity): 4 ng/L (ref <18)
Troponin I (High Sensitivity): 4 ng/L (ref <18)

## 2024-01-23 MED ORDER — HYDROCOD POLI-CHLORPHE POLI ER 10-8 MG/5ML PO SUER
5.0000 mL | Freq: Once | ORAL | Status: AC
  Administered 2024-01-23: 5 mL via ORAL
  Filled 2024-01-23: qty 5

## 2024-01-23 MED ORDER — BENZONATATE 100 MG PO CAPS
100.0000 mg | ORAL_CAPSULE | Freq: Once | ORAL | Status: AC
  Administered 2024-01-23: 100 mg via ORAL
  Filled 2024-01-23: qty 1

## 2024-01-23 MED ORDER — IPRATROPIUM-ALBUTEROL 0.5-2.5 (3) MG/3ML IN SOLN
3.0000 mL | Freq: Once | RESPIRATORY_TRACT | Status: AC
  Administered 2024-01-23: 3 mL via RESPIRATORY_TRACT
  Filled 2024-01-23: qty 3

## 2024-01-23 MED ORDER — METHYLPREDNISOLONE SODIUM SUCC 125 MG IJ SOLR
125.0000 mg | INTRAMUSCULAR | Status: AC
  Administered 2024-01-23: 125 mg via INTRAVENOUS
  Filled 2024-01-23: qty 2

## 2024-01-23 MED ORDER — IOHEXOL 350 MG/ML SOLN
75.0000 mL | Freq: Once | INTRAVENOUS | Status: AC | PRN
  Administered 2024-01-23: 75 mL via INTRAVENOUS

## 2024-01-23 NOTE — ED Provider Notes (Signed)
 [facility] Provider Note    None    (approximate)   History   Shortness of Breath   HPI  Krystal Lee is a 68 y.o. female   who has a history of smoking, COPD, bipolar disorder.  Patient reports that since waking up after her biopsy she has had pain in her chest when she breathes over the right upper.  No nausea or vomiting.  No fevers or chills.  She has had some wheezing is been taking steroids as prescribed by Dr. Jayme Cloud.  She has communicated her symptoms to Dr. Jayme Cloud and they do not seem to be getting better  No fevers or chills.  Some wheezing.  Slight shortness of breath which has been present now for a long time.  Patient reports that in the past use of Tussionex has been helpful for similar symptoms especially the coughing.  Primary concern is the persistence of the cough which is causing her ribs to ache over the last week   Patient is a smoker recent diagnosis of organizing pneumonia followed by Dr. Jayme Cloud.  Physical Exam   Triage Vital Signs: ED Triage Vitals  Encounter Vitals Group     BP 01/23/24 1614 130/86     Systolic BP Percentile --      Diastolic BP Percentile --      Pulse Rate 01/23/24 1614 (!) 105     Resp 01/23/24 1614 (!) 21     Temp 01/23/24 1614 (!) 97.5 F (36.4 C)     Temp Source 01/23/24 1614 Oral     SpO2 01/23/24 1614 93 %     Weight 01/23/24 1609 146 lb (66.2 kg)     Height 01/23/24 1609 4\' 11"  (1.499 m)     Head Circumference --      Peak Flow --      Pain Score 01/23/24 1609 10     Pain Loc --      Pain Education --      Exclude from Growth Chart --     Most recent vital signs: Vitals:   01/23/24 2300 01/23/24 2330  BP: (!) 171/93 (!) 167/95  Pulse: 87 88  Resp: 16 17  Temp:    SpO2: 93% 91%     General: Awake, no distress.  CV:  Good peripheral perfusion.  Normal tone slight tachycardia Resp:  Normal effort.  Frequent dry cough.  Reports pleuritic pain with coughing.  Lung sounds  with slight wheezing expiratory.  Her work of breathing overall appears quite comfortable no oxygen requirement.  She speaks in full clear sentences Abd:  No distention.  Other:  No lower extremity edema venous cords or congestion   ED Results / Procedures / Treatments   Labs (all labs ordered are listed, but only abnormal results are displayed) Labs Reviewed  BASIC METABOLIC PANEL - Abnormal; Notable for the following components:      Result Value   Glucose, Bld 113 (*)    All other components within normal limits  RESP PANEL BY RT-PCR (RSV, FLU A&B, COVID)  RVPGX2  CBC  TROPONIN I (HIGH SENSITIVITY)  TROPONIN I (HIGH SENSITIVITY)   Normal CBC and metabolic panel.  Troponin normal x 2.  Symptoms atypical of ACS, felt extremely unlikely that this presentation would be associated with ACS.  EKG  And interpreted by me at 1615 heart rate 100 QRS 80 QTc 420 Sinus tachycardia without acute frank ischemia.  There is mild suggestion of slight  ST segment depression in V4 possibly V5 and V6, but this is marginal if anything   RADIOLOGY   DG Chest 2 View Result Date: 01/23/2024 CLINICAL DATA:  Status post recent lung biopsy with subsequent chest pressure and worsening of shortness of breath. EXAM: CHEST - 2 VIEW COMPARISON:  January 14, 2024 FINDINGS: The heart size and mediastinal contours are within normal limits. There is extensive calcification of the aortic arch. The patient's known right upper lobe cavitary lesion is seen with a mild amount of superimposed scarring, atelectasis and/or infiltrate. This is decreased in severity when compared to the prior study. No pleural effusion or pneumothorax is identified. A chronic appearing compression fracture deformity is seen within the lower thoracic spine with multilevel degenerative changes also noted. IMPRESSION: Right upper lobe cavitary lesion with a mild amount of superimposed scarring, atelectasis and/or infiltrate. Electronically Signed    By: Aram Candela M.D.   On: 01/23/2024 19:29     PROCEDURES:  Critical Care performed: No  Procedures   MEDICATIONS ORDERED IN ED: Medications  ipratropium-albuterol (DUONEB) 0.5-2.5 (3) MG/3ML nebulizer solution 3 mL (3 mLs Nebulization Given 01/23/24 2114)  benzonatate (TESSALON) capsule 100 mg (100 mg Oral Given 01/23/24 2114)  chlorpheniramine-HYDROcodone (TUSSIONEX) 10-8 MG/5ML suspension 5 mL (5 mLs Oral Given 01/23/24 2114)  iohexol (OMNIPAQUE) 350 MG/ML injection 75 mL (75 mLs Intravenous Contrast Given 01/23/24 2120)  methylPREDNISolone sodium succinate (SOLU-MEDROL) 125 mg/2 mL injection 125 mg (125 mg Intravenous Given 01/23/24 2230)  azithromycin (ZITHROMAX) tablet 500 mg (500 mg Oral Given 01/24/24 0023)     IMPRESSION / MDM / ASSESSMENT AND PLAN / ED COURSE  I reviewed the triage vital signs and the nursing notes.                              Differential diagnosis includes, but is not limited to, possible pleurisy related to recent procedure especially given the proximity, mild COPD exacerbation, infection though her lack of fever and infectious symptoms as well as normal white count appear reassuring.  She does have right upper lobe cavitary or infiltrative type lesion this appears to be consistent with previous imaging though.  Does not appear to my interpretation and review of notes of seems to be an acute worsening of her chest film.  No evidence of ACS by ECG and clinical history troponin normal.  Given the proximity of her symptoms well pleuritic pain starting immediately postprocedure will obtain CT to evaluate exclude other causes such as small pneumothorax, pleural effusion, evidence of hemorrhage, pulmonary embolism etc.  Patient's presentation is most consistent with acute complicated illness / injury requiring diagnostic workup.   Relative patient does not appear acutely ill appears to have symptoms that are somewhat chronic especially after her biopsy.  Very mild  wheezing history of underlying COPD, suspicious for organizing pneumonia.  No clear evidence of acute infection noted.  Awaiting CT to exclude PE and further evaluate region especially in the right upper lobe.  I anticipate that the patient will likely be able to discharge home if no thromboembolism or major acute concerning finding.  Tussionex given for cough and associated pleuritic pain.  No evidence of ACS.  Ongoing care and disposition including follow-up on CT and reassessment of the patient for disposition decision assigned to my partner Dr. Dolores Frame at approximately 11:15 PM       FINAL CLINICAL IMPRESSION(S) / ED DIAGNOSES   Final diagnoses:  COPD  exacerbation (HCC)  Respiratory infection  Pleurisy     Rx / DC Orders     Sharyn Creamer, MD 01/25/24 [phone]

## 2024-01-23 NOTE — ED Notes (Signed)
 Lab called to get pt's blood work.

## 2024-01-23 NOTE — ED Triage Notes (Signed)
 Pt via POV from home. Pt c/o SOB since last Tuesday, reports she had a lung biopsy and since then she has been increasingly SOB. Report chest pressure that also started on Tuesday reports that feels like elephant was sitting on her chest. Pt has hx of COPD, reports she hasn't smoked since last week. Pt is A&Ox4 and NAD

## 2024-01-24 MED ORDER — AZITHROMYCIN 250 MG PO TABS: 250.0000 mg | ORAL_TABLET | Freq: Every day | ORAL | 0 refills | Status: DC

## 2024-01-24 MED ORDER — HYDROCOD POLI-CHLORPHE POLI ER 10-8 MG/5ML PO SUER: 5.0000 mL | Freq: Two times a day (BID) | ORAL | 0 refills | Status: DC | PRN

## 2024-01-24 MED ORDER — AZITHROMYCIN 500 MG PO TABS
500.0000 mg | ORAL_TABLET | Freq: Once | ORAL | Status: AC
  Administered 2024-01-24: 500 mg via ORAL
  Filled 2024-01-24: qty 1

## 2024-01-24 MED ORDER — HYDROCOD POLI-CHLORPHE POLI ER 10-8 MG/5ML PO SUER
5.0000 mL | Freq: Two times a day (BID) | ORAL | 0 refills | Status: DC | PRN
Start: 2024-01-24 — End: 2024-01-24

## 2024-01-24 MED ORDER — HYDROCOD POLI-CHLORPHE POLI ER 10-8 MG/5ML PO SUER
5.0000 mL | Freq: Two times a day (BID) | ORAL | 0 refills | Status: DC | PRN
Start: 2024-01-24 — End: 2024-02-20

## 2024-01-24 NOTE — ED Provider Notes (Signed)
-----------------------------------------   12:17 AM on 01/24/2024 -----------------------------------------   CTA chest:   1. Negative for acute pulmonary embolus or aortic dissection.  2. Advanced emphysema. Irregular cavitary lesion in the right upper  lobe with some regression as compared with CT from January.  Recommend correlation with interval biopsy results.  3. Minimal ground-glass densities in the right lower lobe are  suspicious for infection or inflammation.   Patient is overall feeling better.  Updated her and family member on CT results.  Will start azithromycin.  Prescribe Tussionex as needed for cough.  She will follow-up closely with her pulmonologist.  Strict return precautions given.  Patient verbalizes understanding and agrees with plan of care.   Irean Hong, MD 01/24/24 [phone]

## 2024-01-24 NOTE — Discharge Instructions (Signed)
 Take and finish antibiotic as prescribed.  You may take Tussionex twice daily as needed for cough.  Return to the ER for worsening symptoms, persistent vomiting, difficulty breathing or other concerns.

## 2024-02-20 ENCOUNTER — Ambulatory Visit: Payer: Medicare HMO | Admitting: Pulmonary Disease

## 2024-02-20 ENCOUNTER — Encounter: Payer: Self-pay | Admitting: Pulmonary Disease

## 2024-02-20 VITALS — BP 108/66 | HR 105 | Temp 97.6°F | Ht 59.0 in | Wt 151.4 lb

## 2024-02-20 DIAGNOSIS — F17210 Nicotine dependence, cigarettes, uncomplicated: Secondary | ICD-10-CM

## 2024-02-20 DIAGNOSIS — J449 Chronic obstructive pulmonary disease, unspecified: Secondary | ICD-10-CM | POA: Diagnosis not present

## 2024-02-20 DIAGNOSIS — J8489 Other specified interstitial pulmonary diseases: Secondary | ICD-10-CM | POA: Diagnosis not present

## 2024-02-20 DIAGNOSIS — R918 Other nonspecific abnormal finding of lung field: Secondary | ICD-10-CM | POA: Diagnosis not present

## 2024-02-20 MED ORDER — PREDNISONE 10 MG PO TABS: 10.0000 mg | ORAL_TABLET | Freq: Every day | ORAL | 2 refills | Status: DC

## 2024-02-20 NOTE — Progress Notes (Signed)
 Subjective:    Patient ID: Krystal Lee, female    DOB: 14-Nov-1956, 68 y.o.   MRN: [id number]  Patient Care Team: Sharyne Degree, FNP as PCP - General (Family Medicine) Sharyne Degree, FNP (Family Medicine)  Chief Complaint  Patient presents with   Follow-up    Cough, shortness of breath and wheezing. Not using oxygen x a few weeks.     BACKGROUND/INTERVAL: 68 year-old current smoker.  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 Patient was last seen on 14 January 2024 during robotic assisted navigational bronchoscopy.    HPI Discussed the use of AI scribe software for clinical note transcription with the patient, who gave verbal consent to proceed.  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  Patient presents with her sister.  She is currently managing organizing pneumonia with prednisone at a dose of 10 mg daily. She requests a refill and continuation of this medication. She attributes recent weight gain to prednisone use.  A CT scan performed on January 13, 2024, showed some reduction in the size of the inflammation. Despite a subsequent emergency room visit on January 23, 2024, which was too soon to assess the full effect of prednisone, she recalls being informed about severe inflammation and the possibility of scarring on a CT angio performed during that visit.  Biopsy specimens from the bronchoscopy where significant only for inflammatory changes.  Likely an organizing pneumonia.  She will need careful monitoring.  She continues to smoke and is concerned about potential worsening of her lung condition if she continues smoking.  She was counseled with regards to discontinuation of smoking.    Review of Systems A 10 point review of systems was performed  and it is as noted above otherwise negative.   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 12/12/2023   Nocturnal hypoxemia due to emphysema (HCC) 12/12/2023   Tobacco dependence due to cigarettes 12/12/2023   Abnormal CT of the chest 07/25/2023   Schizophrenia, paranoid (HCC) 09/16/2022   Hallucinations 09/16/2022   Dyspnea 09/16/2022   Atypical chest pain 09/16/2022   Schizophrenia (HCC) 09/16/2022   Bipolar 1 disorder (HCC)    Depression    Anxiety    Hypertension    Sepsis (HCC)    HLD (hyperlipidemia)    GERD (gastroesophageal reflux disease)    Tobacco abuse    COPD exacerbation (HCC) 07/28/2018   Acute on chronic respiratory failure with hypoxia and hypercapnia (HCC) 09/16/2017   COPD with acute exacerbation (HCC) 09/16/2017    Social History   Tobacco Use   Smoking status: Every Day    Current packs/day: 0.00    Average packs/day: 1 pack/day for 45.0 years (45.0 ttl pk-yrs)    Types: Cigarettes    Start date: 05/01/1976    Last attempt to quit: 05/01/2021    Years since quitting: 2.8   Smokeless tobacco: Never   Tobacco comments:    Started smoking at 68 yrs old.    10 cigarettes a day  khj 01/02/2024  Substance Use Topics   Alcohol use: Not Currently    Alcohol/week: 1.0 standard drink of alcohol  Types: 1 Cans of beer per week    No Known Allergies  Current Meds  Medication Sig   albuterol (PROVENTIL) (2.5 MG/3ML) 0.083% nebulizer solution Take 2.5 mg by nebulization every 6 (six) hours as needed for wheezing or shortness of breath.   ALPRAZolam (XANAX) 0.5 MG tablet Take 0.5 mg by mouth 3 (three) times daily as needed for anxiety.   budesonide (PULMICORT) 0.25 MG/2ML nebulizer solution Take 2 mLs (0.25 mg total) by nebulization 2 (two) times daily.   cholecalciferol (VITAMIN D3) 25 MCG (1000 UNIT) tablet Take 1,000 Units by mouth  daily.   furosemide (LASIX) 20 MG tablet Take 1 tablet (20 mg total) by mouth daily.   gabapentin (NEURONTIN) 300 MG capsule Take 300 mg by mouth daily.   ibuprofen (ADVIL) 600 MG tablet Take 600 mg by mouth 2 (two) times daily as needed.   ipratropium-albuterol (DUONEB) 0.5-2.5 (3) MG/3ML SOLN Take 3 mLs by nebulization every 6 (six) hours.   montelukast (SINGULAIR) 10 MG tablet Take 10 mg by mouth at bedtime.   pantoprazole (PROTONIX) 40 MG tablet Take 1 tablet (40 mg total) by mouth daily after breakfast.   QUEtiapine Fumarate (SEROQUEL XR) 150 MG 24 hr tablet Take 150 mg by mouth at bedtime.   rosuvastatin (CRESTOR) 40 MG tablet Take 40 mg by mouth at bedtime.   valsartan (DIOVAN) 80 MG tablet Take 1 tablet (80 mg total) by mouth daily.   [DISCONTINUED] predniSONE (DELTASONE) 10 MG tablet Take 2 tablets daily with breakfast for 7 days then decrease to 1 tablet daily.    Immunization History  Administered Date(s) Administered   Influenza, Seasonal, Injecte, Preservative Fre 09/05/2023   Influenza,inj,Quad PF,6+ Mos 09/17/2017   Influenza-Unspecified 08/19/2018, 09/24/2022   Pneumococcal Polysaccharide-23 09/17/2017        Objective:     BP 108/66 (BP Location: Right Arm, Patient Position: Sitting, Cuff Size: Normal)   Pulse (!) 105   Temp 97.6 F (36.4 C) (Temporal)   Ht 4\' 11"  (1.499 m)   Wt 151 lb 6.4 oz (68.7 kg)   SpO2 95%   BMI 30.58 kg/m   SpO2: 95 %  GENERAL: Appears acutely ill but nontoxic.  Well-developed, overweight woman, uncomfortable but no acute respiratory distress.  Fully ambulatory.  No conversational dyspnea. HEAD: Normocephalic, atraumatic.  EYES: Pupils equal, round, reactive to light.  No scleral icterus.  MOUTH: Oral mucosa moist.  No thrush. NECK: Supple. No thyromegaly. Trachea midline. No JVD.  No adenopathy. PULMONARY: Distant breath sounds with poor air movement bilaterally.  Coarse, diffuse wheezing throughout. CARDIOVASCULAR: S1 and S2.  Regular rate and rhythm.  No rubs, murmurs or gallops heard. ABDOMEN: Benign. MUSCULOSKELETAL: No joint deformity, no clubbing, no edema.  NEUROLOGIC: No overt focal deficit, no gait disturbance, speech is fluent. SKIN: Intact,warm,dry.  No rashes.. PSYCH: Mood and behavior normal.     Assessment & Plan:     ICD-10-CM   1. Organizing pneumonia (HCC)  J84.89     2. Opacity of lung on imaging study  R91.8 CT CHEST WO CONTRAST    3. 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 Tobacco dependence due to cigarettes  F17.210       Orders Placed This Encounter  Procedures   CT CHEST WO CONTRAST    Standing Status:   Future    Expected Date:   04/13/2024    Expiration Date:   02/19/2025    Preferred imaging location?:  [facility]    Meds ordered this encounter  Medications   predniSONE (DELTASONE) 10 MG tablet    Sig: Take 1 tablet (10 mg total) by mouth daily with breakfast.    Dispense:  30 tablet    Refill:  2   Discussion:    Organizing pneumonia Organizing pneumonia is managed with prednisone, showing a reduction in inflammation size since the last CT scan. Inflammation may not completely resolve and could result in scarring. Continued smoking may impact the condition and treatment outcomes. A follow-up CT scan is planned to monitor the condition, with a potential need for a repeat biopsy if process does not resolve. - Continue prednisone 10 mg daily - Order follow-up CT scan in three months at [facility] - Advise low-carbohydrate diet to prevent weight gain - Discuss potential need for repeat biopsy if process does not resolve  Smoking cessation She continues to smoke despite awareness of the negative impact on lung health and potential worsening of her condition. Acknowledges the need to quit smoking to improve prognosis. - Advise smoking cessation to improve lung health and prognosis     Advised if symptoms do not improve or worsen, to  please contact office for sooner follow up or seek emergency care.    I spent 40 minutes of dedicated to the care of this patient on the date of this encounter to include pre-visit review of records, face-to-face time with the patient discussing conditions above, post visit ordering of testing, clinical documentation with the electronic health record, making appropriate referrals as documented, and communicating necessary findings to members of the patients care team.     C. Chloe Counter, MD Advanced Bronchoscopy PCCM [facility]    *This note was generated using voice recognition software/Dragon and/or AI transcription program.  Despite best efforts to proofread, errors can occur which can change the meaning. Any transcriptional errors that result from this process are unintentional and may not be fully corrected at the time of dictation.

## 2024-02-20 NOTE — Patient Instructions (Signed)
 VISIT SUMMARY:  Today, we discussed the management of your organizing pneumonia and the impact of smoking on your lung health. We reviewed your recent CT scan results and your current medication regimen.  YOUR PLAN:  -ORGANIZING PNEUMONIA: Organizing pneumonia is a condition where inflammation in the lungs can lead to scarring. You will continue taking prednisone at 10 mg daily to manage this condition. A follow-up CT scan will be scheduled in three months to monitor your progress. Additionally, a low-carbohydrate diet is recommended to help prevent weight gain associated with prednisone. We also discussed the potential need for a repeat biopsy after your prednisone treatment to ensure the inflammation has resolved.  -SMOKING CESSATION: Continuing to smoke can worsen your lung condition and impact the effectiveness of your treatment. Quitting smoking is strongly advised to improve your lung health and overall prognosis.  INSTRUCTIONS:  Please continue taking prednisone 10 mg daily and follow a low-carbohydrate diet to manage your weight. Schedule a follow-up CT scan in three months at [facility]. Consider quitting smoking to improve your lung health. We may need to perform a repeat biopsy after your prednisone treatment to ensure the inflammation has resolved.

## 2024-04-14 ENCOUNTER — Ambulatory Visit
Admission: RE | Admit: 2024-04-14 | Discharge: 2024-04-14 | Disposition: A | Discharge status: Home / Self Care | Source: Ambulatory Visit | Attending: Pulmonary Disease | Admitting: Pulmonary Disease

## 2024-04-14 DIAGNOSIS — R918 Other nonspecific abnormal finding of lung field: Secondary | ICD-10-CM | POA: Diagnosis present

## 2024-04-28 ENCOUNTER — Ambulatory Visit: Admitting: Pulmonary Disease

## 2024-04-28 ENCOUNTER — Encounter: Payer: Self-pay | Admitting: Pulmonary Disease

## 2024-04-28 VITALS — BP 114/70 | HR 110 | Temp 97.9°F | Ht 59.0 in | Wt 162.0 lb

## 2024-04-28 DIAGNOSIS — J984 Other disorders of lung: Secondary | ICD-10-CM | POA: Diagnosis not present

## 2024-04-28 DIAGNOSIS — F17210 Nicotine dependence, cigarettes, uncomplicated: Secondary | ICD-10-CM

## 2024-04-28 DIAGNOSIS — J449 Chronic obstructive pulmonary disease, unspecified: Secondary | ICD-10-CM

## 2024-04-28 NOTE — Patient Instructions (Signed)
 VISIT SUMMARY:  Today, we discussed your COPD, chronic respiratory failure, and the cavitary lung lesion. We reviewed your current medications and smoking habits, and addressed your concerns about constipation and rectal pain.  YOUR PLAN:  -COPD WITH CHRONIC RESPIRATORY FAILURE: Chronic obstructive pulmonary disease (COPD) is a long-term lung condition that makes it hard to breathe, and chronic respiratory failure means your lungs are not getting enough oxygen into your blood. You should use Trelegy daily to help manage your breathing and use nebulizers as needed for acute symptoms.  -CAVITARY LUNG LESION, LIKELY INFLAMMATORY: A cavitary lung lesion is an area in the lung that looks like it has a hole in the middle. Your lesion is likely due to inflammation and has not changed in size. We will do blood work to check for a possible fungal infection.  INSTRUCTIONS:  Please follow up with the blood work as ordered to evaluate for a fungal infection. Continue to use Trelegy daily and nebulizers as needed. Aim to reduce your cigarette consumption as planned. If you experience any new or worsening symptoms, please contact our office.

## 2024-04-28 NOTE — Progress Notes (Signed)
 Subjective:    Patient ID: Krystal Lee, female    DOB: 05-23-1956, 68 y.o.   MRN: [id number]  Patient Care Team: Sharyne Degree, FNP as PCP - General (Family Medicine) Sharyne Degree, FNP (Family Medicine)  Chief Complaint  Patient presents with   Follow-up    Shortness of breath on exertion and occasional at rest. Wheezing.    BACKGROUND/INTERVAL: 68 year-old current smoker.  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 Patient was last seen on 20 February 2024.  She had robotic assisted navigational bronchoscopy on 14 January 2024.  HPI Discussed the use of AI scribe software for clinical note transcription with the patient, who gave verbal consent to proceed.  History of Present Illness   Krystal Lee is a 68 year old female with COPD and chronic respiratory failure who presents for follow-up on a cavitary lung lesion.  She presents with her sisters today.  She has COPD and chronic respiratory failure with hypoxia. She uses nebulizers twice daily and occasionally uses Trelegy, especially when she has nowhere to go. Trelegy can be expensive, but she manages to get a three-month supply for twelve dollars. She is trying to reduce her cigarette consumption and is currently smoking five cigarettes a day, aiming to decrease to three before quitting entirely.  She is concerned about a cavitary lung lesion, which has been biopsied with negative results. The lesion has not increased in size but remains present with some cavitation noted.  She has not had a bowel movement in four days and is experiencing rectal pain. Stool softeners help with the pain but have not resolved the constipation. She has had soft and bloody stools in the past.  She was advised to discuss this issue with her primary care physician.    I have reviewed independently the  chest CT from 14 Apr 2024.  CT was also shown to the patient.  Review of Systems A 10 point review of systems was performed and it is as noted above otherwise negative.   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 12/12/2023   Nocturnal hypoxemia due to emphysema (HCC) 12/12/2023   Tobacco dependence due to cigarettes 12/12/2023   Abnormal CT of the chest 07/25/2023   Schizophrenia, paranoid (HCC) 09/16/2022   Hallucinations 09/16/2022   Dyspnea 09/16/2022   Atypical chest pain 09/16/2022   Schizophrenia (HCC) 09/16/2022   Bipolar 1 disorder (HCC)    Depression    Anxiety    Hypertension    Sepsis (HCC)    HLD (hyperlipidemia)    GERD (gastroesophageal reflux disease)    Tobacco abuse    COPD exacerbation (HCC) 07/28/2018   Acute on chronic respiratory failure with hypoxia and hypercapnia (HCC) 09/16/2017   COPD with acute exacerbation (HCC) 09/16/2017    Social History   Tobacco Use   Smoking status: Every Day    Current packs/day: 0.00    Average packs/day: 1 pack/day for 45.0 years (45.0 ttl pk-yrs)    Types: Cigarettes    Start date: 05/01/1976    Last attempt to quit: 05/01/2021    Years since quitting: 3.0   Smokeless tobacco: Never   Tobacco comments:    Started smoking at 68 yrs old.  10 cigarettes a day  khj 01/02/2024  Substance Use Topics   Alcohol use: Not Currently    Alcohol/week: 1.0 standard drink of alcohol    Types: 1 Cans of beer per week    No Known Allergies  Current Meds  Medication Sig   albuterol  (PROVENTIL ) (2.5 MG/3ML) 0.083% nebulizer solution Take 2.5 mg by nebulization every 6 (six) hours as needed for wheezing or shortness of breath.   ALPRAZolam  (XANAX ) 0.5 MG tablet Take 0.5 mg by mouth 3 (three) times daily as needed for anxiety.   budesonide  (PULMICORT ) 0.25 MG/2ML nebulizer solution Take 2 mLs (0.25 mg  total) by nebulization 2 (two) times daily.   cholecalciferol  (VITAMIN D3) 25 MCG (1000 UNIT) tablet Take 1,000 Units by mouth daily.   Fluticasone -Umeclidin-Vilant (TRELEGY ELLIPTA) 200-62.5-25 MCG/ACT AEPB Inhale into the lungs.   furosemide  (LASIX ) 20 MG tablet Take 1 tablet (20 mg total) by mouth daily.   gabapentin (NEURONTIN) 300 MG capsule Take 300 mg by mouth daily.   ibuprofen  (ADVIL ) 600 MG tablet Take 600 mg by mouth 2 (two) times daily as needed.   ipratropium-albuterol  (DUONEB) 0.5-2.5 (3) MG/3ML SOLN Take 3 mLs by nebulization every 6 (six) hours.   levothyroxine (SYNTHROID) 50 MCG tablet Take 50 mcg by mouth daily.   montelukast  (SINGULAIR ) 10 MG tablet Take 10 mg by mouth at bedtime.   pantoprazole  (PROTONIX ) 40 MG tablet Take 1 tablet (40 mg total) by mouth daily after breakfast.   predniSONE  (DELTASONE ) 10 MG tablet Take 1 tablet (10 mg total) by mouth daily with breakfast.   QUEtiapine  Fumarate (SEROQUEL  XR) 150 MG 24 hr tablet Take 150 mg by mouth at bedtime.   rosuvastatin  (CRESTOR ) 40 MG tablet Take 40 mg by mouth at bedtime.   valsartan  (DIOVAN ) 80 MG tablet Take 1 tablet (80 mg total) by mouth daily.    Immunization History  Administered Date(s) Administered   Influenza, Seasonal, Injecte, Preservative Fre 09/05/2023   Influenza,inj,Quad PF,6+ Mos 09/17/2017   Influenza-Unspecified 08/19/2018, 09/24/2022   Pneumococcal Polysaccharide-23 09/17/2017        Objective:     BP 114/70 (BP Location: Left Arm, Patient Position: Sitting, Cuff Size: Normal)   Pulse (!) 110   Temp 97.9 F (36.6 C) (Oral)   Ht 4' 11 (1.499 m)   Wt 162 lb (73.5 kg)   SpO2 98%   BMI 32.72 kg/m   SpO2: 98 %  GENERAL: Appears acutely ill but nontoxic.  Well-developed, overweight woman, uncomfortable but no acute respiratory distress.  Fully ambulatory.  No conversational dyspnea. HEAD: Normocephalic, atraumatic.  EYES: Pupils equal, round, reactive to light.  No scleral icterus.   MOUTH: Oral mucosa moist.  No thrush. NECK: Supple. No thyromegaly. Trachea midline. No JVD.  No adenopathy. PULMONARY: Distant breath sounds with poor air movement bilaterally.  Coarse, no other adventitious sounds. CARDIOVASCULAR: S1 and S2. Regular rate and rhythm.  No rubs, murmurs or gallops heard. ABDOMEN: Benign. MUSCULOSKELETAL: No joint deformity, no clubbing, no edema.  NEUROLOGIC: No overt focal deficit, no gait disturbance, speech is fluent. SKIN: Intact,warm,dry.  No rashes.. PSYCH: Mood and behavior normal      Assessment & Plan:     ICD-10-CM   1. Cavitary lesion of lung  J98.4 CT CHEST WO CONTRAST    Aspergillus galactomannan antigen    Aspergillus IgE Panel    2. 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 Tobacco dependence due to cigarettes  F17.210  Orders Placed This Encounter  Procedures   CT CHEST WO CONTRAST    Standing Status:   Future    Expected Date:   09/01/2024    Expiration Date:   04/28/2025    Preferred imaging location?:   [facility]   Aspergillus galactomannan antigen    Standing Status:   Future    Expiration Date:   04/28/2025   Aspergillus IgE Panel    Standing Status:   Future    Expiration Date:   04/28/2025   Discussion:    COPD with chronic respiratory failure Chronic obstructive pulmonary disease with chronic respiratory failure. Currently using nebulizers daily and Trelegy intermittently. No wheezing on examination. Emphasized the importance of daily Trelegy use for optimal management of dyspnea. Nebulizers to be used as needed. - Instruct to use Trelegy daily. - Use nebulizers as needed for acute symptoms.  Cavitary lung lesion, likely inflammatory Cavitary lung lesion with negative biopsies, likely inflammatory. Radiologist interpretation is of likely inflammation. Plan to evaluate for fungal infection as a potential cause. Lesion remains unchanged in size with a cavitary appearance. - Order blood work  to evaluate for fungal infection.      Advised if symptoms do not improve or worsen, to please contact office for sooner follow up or seek emergency care.    I spent 30 minutes of dedicated to the care of this patient on the date of this encounter to include pre-visit review of records, face-to-face time with the patient discussing conditions above, post visit ordering of testing, clinical documentation with the electronic health record, making appropriate referrals as documented, and communicating necessary findings to members of the patients care team.     C. Chloe Counter, MD Advanced Bronchoscopy PCCM [facility]    *This note was generated using voice recognition software/Dragon and/or AI transcription program.  Despite best efforts to proofread, errors can occur which can change the meaning. Any transcriptional errors that result from this process are unintentional and may not be fully corrected at the time of dictation.

## 2024-05-12 ENCOUNTER — Other Ambulatory Visit: Payer: Self-pay

## 2024-05-12 ENCOUNTER — Ambulatory Visit: Admitting: Certified Registered"

## 2024-05-12 ENCOUNTER — Encounter: Payer: Self-pay | Admitting: Gastroenterology

## 2024-05-12 ENCOUNTER — Ambulatory Visit
Admission: RE | Admit: 2024-05-12 | Discharge: 2024-05-12 | Disposition: A | Discharge status: Home / Self Care | Attending: Gastroenterology | Admitting: Gastroenterology

## 2024-05-12 ENCOUNTER — Encounter
Admission: RE | Disposition: A | Discharge status: Home / Self Care | Payer: Self-pay | Source: Home / Self Care | Attending: Gastroenterology

## 2024-05-12 DIAGNOSIS — R112 Nausea with vomiting, unspecified: Secondary | ICD-10-CM

## 2024-05-12 DIAGNOSIS — R195 Other fecal abnormalities: Secondary | ICD-10-CM | POA: Diagnosis not present

## 2024-05-12 DIAGNOSIS — Z79899 Other long term (current) drug therapy: Secondary | ICD-10-CM | POA: Diagnosis not present

## 2024-05-12 DIAGNOSIS — Z5941 Food insecurity: Secondary | ICD-10-CM | POA: Insufficient documentation

## 2024-05-12 DIAGNOSIS — Z1211 Encounter for screening for malignant neoplasm of colon: Secondary | ICD-10-CM | POA: Insufficient documentation

## 2024-05-12 DIAGNOSIS — K625 Hemorrhage of anus and rectum: Secondary | ICD-10-CM | POA: Insufficient documentation

## 2024-05-12 DIAGNOSIS — B9681 Helicobacter pylori [H. pylori] as the cause of diseases classified elsewhere: Secondary | ICD-10-CM

## 2024-05-12 DIAGNOSIS — K2950 Unspecified chronic gastritis without bleeding: Secondary | ICD-10-CM | POA: Diagnosis not present

## 2024-05-12 DIAGNOSIS — K219 Gastro-esophageal reflux disease without esophagitis: Secondary | ICD-10-CM | POA: Insufficient documentation

## 2024-05-12 DIAGNOSIS — D122 Benign neoplasm of ascending colon: Secondary | ICD-10-CM | POA: Insufficient documentation

## 2024-05-12 DIAGNOSIS — J449 Chronic obstructive pulmonary disease, unspecified: Secondary | ICD-10-CM | POA: Insufficient documentation

## 2024-05-12 DIAGNOSIS — K640 First degree hemorrhoids: Secondary | ICD-10-CM | POA: Insufficient documentation

## 2024-05-12 DIAGNOSIS — F17210 Nicotine dependence, cigarettes, uncomplicated: Secondary | ICD-10-CM | POA: Insufficient documentation

## 2024-05-12 DIAGNOSIS — I10 Essential (primary) hypertension: Secondary | ICD-10-CM | POA: Insufficient documentation

## 2024-05-12 DIAGNOSIS — D126 Benign neoplasm of colon, unspecified: Secondary | ICD-10-CM

## 2024-05-12 DIAGNOSIS — Z7951 Long term (current) use of inhaled steroids: Secondary | ICD-10-CM | POA: Insufficient documentation

## 2024-05-12 DIAGNOSIS — K5730 Diverticulosis of large intestine without perforation or abscess without bleeding: Secondary | ICD-10-CM | POA: Diagnosis not present

## 2024-05-12 DIAGNOSIS — Z9981 Dependence on supplemental oxygen: Secondary | ICD-10-CM | POA: Insufficient documentation

## 2024-05-12 HISTORY — PX: POLYPECTOMY: SHX149

## 2024-05-12 HISTORY — PX: COLONOSCOPY: SHX5424

## 2024-05-12 HISTORY — PX: HEMOSTASIS CLIP PLACEMENT: SHX6857

## 2024-05-12 HISTORY — PX: ESOPHAGOGASTRODUODENOSCOPY: SHX5428

## 2024-05-12 SURGERY — COLONOSCOPY
Anesthesia: General

## 2024-05-12 MED ORDER — PHENYLEPHRINE 80 MCG/ML (10ML) SYRINGE FOR IV PUSH (FOR BLOOD PRESSURE SUPPORT)
PREFILLED_SYRINGE | INTRAVENOUS | Status: DC | PRN
Start: 2024-05-12 — End: 2024-05-12
  Administered 2024-05-12 (×2): 160 ug via INTRAVENOUS

## 2024-05-12 MED ORDER — LIDOCAINE HCL (PF) 2 % IJ SOLN
INTRAMUSCULAR | Status: DC | PRN
Start: 2024-05-12 — End: 2024-05-12
  Administered 2024-05-12: 65 mg via INTRADERMAL

## 2024-05-12 MED ORDER — PROPOFOL 10 MG/ML IV BOLUS
INTRAVENOUS | Status: DC | PRN
Start: 2024-05-12 — End: 2024-05-12
  Administered 2024-05-12: 90 mg via INTRAVENOUS

## 2024-05-12 MED ORDER — SODIUM CHLORIDE 0.9 % IV SOLN: INTRAVENOUS | Status: DC

## 2024-05-12 MED ORDER — PROPOFOL 500 MG/50ML IV EMUL
INTRAVENOUS | Status: DC | PRN
  Administered 2024-05-12: 100 ug/kg/min via INTRAVENOUS

## 2024-05-12 NOTE — Anesthesia Preprocedure Evaluation (Signed)
 Anesthesia Evaluation  Patient identified by MRN, date of birth, ID band Patient awake    Reviewed: Allergy & Precautions, NPO status , Patient's Chart, lab work & pertinent test results  History of Anesthesia Complications Negative for: history of anesthetic complications  Airway Mallampati: III  TM Distance: <3 FB Neck ROM: full    Dental  (+) Chipped, Poor Dentition, Missing   Pulmonary shortness of breath and with exertion, COPD,  COPD inhaler and oxygen dependent, Current Smoker   Pulmonary exam normal        Cardiovascular hypertension, (-) angina Normal cardiovascular exam     Neuro/Psych  Headaches PSYCHIATRIC DISORDERS         GI/Hepatic ,GERD  Controlled,,(+) Hepatitis -  Endo/Other  negative endocrine ROS    Renal/GU negative Renal ROS  negative genitourinary   Musculoskeletal   Abdominal   Peds  Hematology negative hematology ROS (+)   Anesthesia Other Findings Past Medical History: 07/25/2023: Abnormal CT of the chest 09/16/2017: Acute on chronic respiratory failure with hypoxia and  hypercapnia (HCC) No date: Anxiety No date: Arthritis     Comment:  knees No date: Bipolar 1 disorder (HCC) No date: COPD (chronic obstructive pulmonary disease) (HCC) No date: Depression No date: GERD (gastroesophageal reflux disease) No date: Headache     Comment:  during allergy season No date: Hepatitis C virus 07/05/2023: HLD (hyperlipidemia) No date: Hypertension 12/12/2023: Nocturnal hypoxemia due to emphysema (HCC) 12/12/2023: Post herpetic neuralgia No date: Post-menopausal No date: Schizoaffective disorder (HCC) 09/16/2022: Schizophrenia (HCC) No date: Sepsis (HCC) No date: Thrombocytopenia (HCC)     Comment:  Dr. Babara oncologist  Past Surgical History: 2010: CATARACT EXTRACTION     Comment:  right  06/09/2019: CATARACT EXTRACTION W/PHACO; Left     Comment:  Procedure: CATARACT EXTRACTION PHACO AND  INTRAOCULAR               LENS PLACEMENT (IOC) COMPLICATED  LEFT;  Surgeon:               Jaye Fallow, MD;  Location[facility];                Service: Ophthalmology;  Laterality: Left;  VISION BLUE                [facility]  01/14/2024: ENDOBRONCHIAL ULTRASOUND; Right     Comment:  Procedure: ENDOBRONCHIAL ULTRASOUND;  Surgeon: Tamea Dedra CROME, MD;  Location: [facility];  Service: Pulmonary;                Laterality: Right; 08/31/2013: LOWER EXTREMITY ANGIOGRAPHY; Bilateral     Comment:  [facility], Dr. Marea  BMI    Body Mass Index: 32.68 kg/m      Reproductive/Obstetrics negative OB ROS                             Anesthesia Physical Anesthesia Plan  ASA: 4  Anesthesia Plan: General   Post-op Pain Management:    Induction: Intravenous  PONV Risk Score and Plan: Propofol  infusion and TIVA  Airway Management Planned: Natural Airway and Nasal Cannula  Additional Equipment:   Intra-op Plan:   Post-operative Plan:   Informed Consent: I have reviewed the patients History and Physical, chart, labs and discussed the procedure including the risks, benefits and alternatives for the proposed anesthesia with the patient or authorized representative who has  indicated his/her understanding and acceptance.     Dental Advisory Given  Plan Discussed with: Anesthesiologist, CRNA and Surgeon  Anesthesia Plan Comments: (Patient consented for risks of anesthesia including but not limited to:  - adverse reactions to medications - risk of airway placement if required - damage to eyes, teeth, lips or other oral mucosa - nerve damage due to positioning  - sore throat or hoarseness - Damage to heart, brain, nerves, lungs, other parts of body or loss of life  Patient voiced understanding and assent.)       Anesthesia Quick Evaluation

## 2024-05-12 NOTE — Transfer of Care (Signed)
 Immediate Anesthesia Transfer of Care Note  Patient: Krystal Lee  Procedure(s) Performed: COLONOSCOPY EGD (ESOPHAGOGASTRODUODENOSCOPY) POLYPECTOMY, INTESTINE CONTROL OF HEMORRHAGE, GI TRACT, ENDOSCOPIC, BY CLIPPING OR OVERSEWING  Patient Location: PACU  Anesthesia Type:General  Level of Consciousness: awake and alert   Airway & Oxygen Therapy: Patient Spontanous Breathing  Post-op Assessment: Report given to RN and Post -op Vital signs reviewed and stable  Post vital signs: Reviewed and stable  Last Vitals: normal temp  see flow sheet from PACU  Vitals Value Taken Time  BP 105/52 05/12/24 08:48  Temp    Pulse 92 05/12/24 08:49  Resp 15 05/12/24 08:49  SpO2 100 % 05/12/24 08:49  Vitals shown include unfiled device data.  Last Pain:  Vitals:   05/12/24 0734  TempSrc: Temporal  PainSc: 0-No pain         Complications: No notable events documented.

## 2024-05-12 NOTE — H&P (Signed)
 Ruel Kung, MD [address], [place], [place], [address] [address], [place], [place], [address] Phone: [phone]  Fax: [phone]  Primary Care Physician:  Donal Channing SQUIBB, FNP   Pre-Procedure History & Physical: HPI:  Tenishia Ekman is a 68 y.o. 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 Babara oncologist    Past Surgical History:  Procedure Laterality Date   CATARACT EXTRACTION  2010   right    CATARACT EXTRACTION W/PHACO Left 06/09/2019   Procedure: CATARACT EXTRACTION PHACO AND INTRAOCULAR LENS PLACEMENT (IOC) COMPLICATED  LEFT;  Surgeon: Jaye Fallow, MD;  Location: [facility];  Service: Ophthalmology;  Laterality: Left;  VISION BLUE  OM[facility]    ENDOBRONCHIAL ULTRASOUND Right 01/14/2024   Procedure: ENDOBRONCHIAL ULTRASOUND;  Surgeon: Tamea Dedra CROME, MD;  Location: [facility];  Service: Pulmonary;  Laterality: Right;   LOWER EXTREMITY ANGIOGRAPHY Bilateral 08/31/2013   [facility], Dr. Marea    Prior to Admission medications   Medication Sig Start Date End Date Taking? Authorizing Provider  ALPRAZolam  (XANAX ) 0.5 MG tablet Take 0.5 mg by mouth 3 (three) times daily as needed for anxiety.   Yes [provider]  cholecalciferol  (VITAMIN D3) 25 MCG  (1000 UNIT) tablet Take 1,000 Units by mouth daily.   Yes [provider]  furosemide  (LASIX ) 20 MG tablet Take 1 tablet (20 mg total) by mouth daily. 07/10/21  Yes Gollan, Timothy J, MD  gabapentin (NEURONTIN) 300 MG capsule Take 300 mg by mouth daily. 12/18/23  Yes [provider]  ibuprofen  (ADVIL ) 600 MG tablet Take 600 mg by mouth 2 (two) times daily as needed. 12/18/23  Yes [provider]  levothyroxine (SYNTHROID) 50 MCG tablet Take 50 mcg by mouth daily. 02/06/24  Yes [provider]  montelukast  (SINGULAIR ) 10 MG tablet Take 10 mg by mouth at bedtime.   Yes [provider]  pantoprazole  (PROTONIX ) 40 MG tablet Take 1 tablet (40 mg total) by mouth daily after breakfast. 09/25/22  Yes Cam Charlie Loving, DO  predniSONE  (DELTASONE ) 10 MG tablet Take 1 tablet (10 mg total) by mouth daily with breakfast. 02/20/24  Yes Tamea Dedra CROME, MD  QUEtiapine  Fumarate (SEROQUEL  XR) 150 MG 24 hr tablet Take 150 mg by mouth at bedtime. 01/11/23  Yes [provider]  rosuvastatin  (CRESTOR ) 40 MG tablet Take 40 mg by mouth at bedtime.   Yes [provider]  valsartan  (DIOVAN ) 80 MG tablet Take 1 tablet (80 mg total) by mouth daily. 07/10/21  Yes Gollan, Timothy J, MD  albuterol  (PROVENTIL ) (2.5 MG/3ML) 0.083% nebulizer solution Take 2.5 mg by nebulization every 6 (six) hours as needed for wheezing or shortness of breath.  [provider]  Fluticasone -Umeclidin-Vilant (TRELEGY ELLIPTA) 200-62.5-25 MCG/ACT AEPB Inhale into the lungs.    [provider]    Allergies as of 05/06/2024   (No Known Allergies)    Family History  Problem Relation Age of Onset   Breast cancer Paternal Grandmother    Bone cancer Paternal Grandmother    Pancreatic cancer Mother    Heart disease Father    Heart disease Paternal Uncle    Stroke Paternal Grandfather     Social History   Socioeconomic History   Marital status: Widowed    Spouse  name: Not on file   Number of children: Not on file   Years of education: Not on file   Highest education level: Not on file  Occupational History   Not on file  Tobacco Use   Smoking status: Every Day    Current packs/day: 0.00    Average packs/day: 1 pack/day for 45.0 years (45.0 ttl pk-yrs)    Types: Cigarettes    Start date: 05/01/1976    Last attempt to quit: 05/01/2021    Years since quitting: 3.0   Smokeless tobacco: Never   Tobacco comments:    Started smoking at 68 yrs old.    10 cigarettes a day  khj 01/02/2024  Vaping Use   Vaping status: Never Used  Substance and Sexual Activity   Alcohol use: Not Currently    Alcohol/week: 1.0 standard drink of alcohol    Types: 1 Cans of beer per week   Drug use: No    Comment: prescribed xanax    Sexual activity: Not on file  Other Topics Concern   Not on file  Social History Narrative   Not on file   Social Drivers of Health   Financial Resource Strain: Low Risk  (05/06/2024)   Received from [facility]   Overall Financial Resource Strain (CARDIA)    Difficulty of Paying Living Expenses: Not very hard  Food Insecurity: Food Insecurity Present (05/06/2024)   Received from [facility]   Hunger Vital Sign    Within the past 12 months, you worried that your food would run out before you got the money to buy more.: Sometimes true    Within the past 12 months, the food you bought just didn't last and you didn't have money to get more.: Sometimes true  Transportation Needs: No Transportation Needs (05/06/2024)   Received from [facility] - Transportation    In the past 12 months, has lack of transportation kept you from medical appointments or from getting medications?: No    Lack of Transportation (Non-Medical): No  Physical Activity: Unknown (01/21/2019)   Received from [facility]   Exercise Vital Sign    Days of Exercise per Week: 0 days    Minutes of Exercise  per Session: Patient declined  Stress: Stress Concern Present (01/21/2019)   Received from [facility] - Occupational Stress Questionnaire    Feeling of Stress : Very much  Social Connections: Unknown (01/21/2019)   Received from [facility]   Social Connection and Isolation Panel    Frequency of Communication with Friends and Family: Patient declined    Frequency of Social Gatherings with Friends and Family: Patient declined    Attends Religious Services: Never    Database administrator or Organizations: No    Attends Engineer, structural: Patient declined  Marital Status: Patient declined  Intimate Partner Violence: Not At Risk (11/22/2022)   Humiliation, Afraid, Rape, and Kick questionnaire    Fear of Current or Ex-Partner: No    Emotionally Abused: No    Physically Abused: No    Sexually Abused: No    Review of Systems: See HPI, otherwise negative ROS  Physical Exam: BP (!) 143/84   Pulse (!) 105   Temp (!) 96.8 F (36 C) (Temporal)   Resp (!) 22   Ht 4' 11 (1.499 m)   Wt 73.4 kg   SpO2 99%   BMI 32.68 kg/m  General:   Alert,  pleasant and cooperative in NAD Head:  Normocephalic and atraumatic. Neck:  Supple; no masses or thyromegaly. Lungs:  Clear throughout to auscultation, normal respiratory effort.    Heart:  +S1, +S2, Regular rate and rhythm, No edema. Abdomen:  Soft, nontender and nondistended. Normal bowel sounds, without guarding, and without rebound.   Neurologic:  Alert and  oriented x4;  grossly normal neurologically.  Impression/Plan: Sheriden Archibeque is here for an endoscopy and colonoscopy  to be performed for  evaluation of nausea and positive cologuard    Risks, benefits, limitations, and alternatives regarding endoscopy have been reviewed with the patient.  Questions have been answered.  All parties agreeable.   Ruel Kung, MD  05/12/2024, 7:40 AM

## 2024-05-12 NOTE — Op Note (Signed)
 [facility] Patient Name: Krystal Lee Procedure Date: 05/12/2024 8:06 AM MRN: [id number] Account #: [id number] Date of Birth: [birth year] Admit Type: Outpatient Age: 67 Room: [facility] ENDO ROOM 1 Gender: Female Note Status: Supervisor Override Instrument Name: Arvis [id number] Procedure:             Colonoscopy Indications:           Screening for colorectal malignant neoplasm due to                         positive Cologuard test, Rectal bleeding, Diarrhea Providers:             Ruel Kung MD, MD Referring MD:          No Local Md, MD (Referring MD) Medicines:             Monitored Anesthesia Care Complications:         No immediate complications. Procedure:             Pre-Anesthesia Assessment:                        - Prior to the procedure, a History and Physical was                         performed, and patient medications, allergies and                         sensitivities were reviewed. The patient's tolerance                         of previous anesthesia was reviewed.                        - The risks and benefits of the procedure and the                         sedation options and risks were discussed with the                         patient. All questions were answered and informed                         consent was obtained.                        - ASA Grade Assessment: II - A patient with mild                         systemic disease.                        After obtaining informed consent, the colonoscope was                         passed under direct vision. Throughout the procedure,                         the patient's blood pressure, pulse, and oxygen  saturations were monitored continuously. The                         Colonoscope was introduced through the anus and                         advanced to the the cecum, identified by the                         appendiceal orifice. The colonoscopy was  performed                         with ease. The patient tolerated the procedure well.                         The quality of the bowel preparation was good. The                         ileocecal valve, appendiceal orifice, and rectum were                         photographed. Findings:      The perianal and digital rectal examinations were normal.      Non-bleeding internal hemorrhoids were found during retroflexion. The       hemorrhoids were small and Grade I (internal hemorrhoids that do not       prolapse).      Multiple medium-mouthed diverticula were found in the entire colon.      Two sessile polyps were found in the ascending colon. The polyps were 7       to 10 mm in size. These polyps were removed with a cold snare. Resection       and retrieval were complete. To prevent bleeding post-intervention, one       hemostatic clip was successfully placed. Clip manufacturer: [employer]. There was no bleeding at the end of the procedure.      The exam was otherwise without abnormality on direct and retroflexion       views. Impression:            - Non-bleeding internal hemorrhoids.                        - Diverticulosis in the entire examined colon.                        - Two 7 to 10 mm polyps in the ascending colon,                         removed with a cold snare. Resected and retrieved.                         Clip was placed. Clip manufacturer: [employer].                        - The examination was otherwise normal on direct and                         retroflexion views. Recommendation:        -  Discharge patient to home (with escort).                        - Resume previous diet.                        - Continue present medications.                        - Await pathology results.                        - Repeat colonoscopy in 3 years for surveillance.                        - Return to GI office as previously scheduled. Procedure Code(s):     ---  Professional ---                        [phone], Colonoscopy, flexible; with removal of                         tumor(s), polyp(s), or other lesion(s) by snare                         technique Diagnosis Code(s):     --- Professional ---                        Z12.11, Encounter for screening for malignant neoplasm                         of colon                        R19.5, Other fecal abnormalities                        K64.0, First degree hemorrhoids                        D12.2, Benign neoplasm of ascending colon                        K57.30, Diverticulosis of large intestine without                         perforation or abscess without bleeding CPT copyright 2022 American Medical Association. All rights reserved. The codes documented in this report are preliminary and upon coder review may  be revised to meet current compliance requirements. Ruel Kung, MD Ruel Kung MD, MD 05/12/2024 8:48:41 AM This report has been signed electronically. Number of Addenda: 0 Note Initiated On: 05/12/2024 8:06 AM Scope Withdrawal Time: 0 hours 19 minutes 40 seconds  Total Procedure Duration: 0 hours 22 minutes 3 seconds  Estimated Blood Loss:  Estimated blood loss: none.      [facility]

## 2024-05-12 NOTE — Anesthesia Postprocedure Evaluation (Signed)
 Anesthesia Post Note  Patient: Krystal Lee  Procedure(s) Performed: COLONOSCOPY EGD (ESOPHAGOGASTRODUODENOSCOPY) POLYPECTOMY, INTESTINE CONTROL OF HEMORRHAGE, GI TRACT, ENDOSCOPIC, BY CLIPPING OR OVERSEWING  Patient location during evaluation: Endoscopy Anesthesia Type: General Level of consciousness: awake and alert Pain management: pain level controlled Vital Signs Assessment: post-procedure vital signs reviewed and stable Respiratory status: spontaneous breathing, nonlabored ventilation and respiratory function stable Cardiovascular status: blood pressure returned to baseline and stable Postop Assessment: no apparent nausea or vomiting Anesthetic complications: no   No notable events documented.   Last Vitals:  Vitals:   05/12/24 0858 05/12/24 0908  BP: 109/84 127/67  Pulse: 94 87  Resp: 17 11  Temp:    SpO2: 100% 95%    Last Pain:  Vitals:   05/12/24 0908  TempSrc:   PainSc: 0-No pain                 Fairy POUR Markeesha Char

## 2024-05-12 NOTE — Op Note (Signed)
 [facility] Patient Name: Krystal Lee Procedure Date: 05/12/2024 8:03 AM MRN: [id number] Account #: [id number] Date of Birth: 1956/02/26 Admit Type: Outpatient Age: 68 Room: [facility] ENDO ROOM 1 Gender: Female Note Status: Supervisor Override Instrument Name: Upper Endoscope [id number] Procedure:             Upper GI endoscopy Indications:           Gastro-esophageal reflux disease, Nausea, Vomiting Providers:             Ruel Kung MD, MD Referring MD:          No Local Md, MD (Referring MD) Medicines:             Monitored Anesthesia Care Complications:         No immediate complications. Procedure:             Pre-Anesthesia Assessment:                        - Prior to the procedure, a History and Physical was                         performed, and patient medications, allergies and                         sensitivities were reviewed. The patient's tolerance                         of previous anesthesia was reviewed.                        - The risks and benefits of the procedure and the                         sedation options and risks were discussed with the                         patient. All questions were answered and informed                         consent was obtained.                        - ASA Grade Assessment: II - A patient with mild                         systemic disease.                        After obtaining informed consent, the endoscope was                         passed under direct vision. Throughout the procedure,                         the patient's blood pressure, pulse, and oxygen                         saturations were monitored continuously. The Endoscope  was introduced through the mouth, and advanced to the                         third part of duodenum. The upper GI endoscopy was                         accomplished with ease. The patient tolerated the                         procedure  well. Findings:      The esophagus was normal.      The examined duodenum was normal.      The stomach was normal.      The entire examined stomach was normal. Biopsies were taken with a cold       forceps for histology.      The cardia and gastric fundus were normal on retroflexion. Impression:            - Normal esophagus.                        - Normal examined duodenum.                        - Normal stomach.                        - Normal stomach. Biopsied. Recommendation:        - Await pathology results.                        - Perform a colonoscopy today. Procedure Code(s):     --- Professional ---                        [phone], Esophagogastroduodenoscopy, flexible,                         transoral; with biopsy, single or multiple Diagnosis Code(s):     --- Professional ---                        R11.0, Nausea CPT copyright 2022 American Medical Association. All rights reserved. The codes documented in this report are preliminary and upon coder review may  be revised to meet current compliance requirements. Ruel Kung, MD Ruel Kung MD, MD 05/12/2024 8:21:43 AM This report has been signed electronically. Number of Addenda: 0 Note Initiated On: 05/12/2024 8:03 AM Estimated Blood Loss:  Estimated blood loss: none.      [facility]

## 2024-05-13 ENCOUNTER — Encounter: Payer: Self-pay | Admitting: Gastroenterology

## 2024-05-13 LAB — SURGICAL PATHOLOGY

## 2024-05-26 ENCOUNTER — Ambulatory Visit: Payer: Self-pay | Admitting: Gastroenterology

## 2024-05-26 NOTE — Progress Notes (Signed)
 Inform    Inform pt BX showsH pylori gastritis.    PPI standard dose : Prilosec 40 mg BID   All meds for 14 days   Bismuth subsalicylate 300 or 524 mg or Bismuth subcitrate 120 to 300 mg (not available in [place] )     Tetracycline 500 mg : 4 times a day      Metronidazole 500 mg: 3 times a dat       will need repeat H pylori stool antigen to check for eradication after 6 weeks of completing treatment.      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

## 2024-05-30 ENCOUNTER — Other Ambulatory Visit: Payer: Self-pay | Admitting: Pulmonary Disease

## 2024-08-17 ENCOUNTER — Other Ambulatory Visit (INDEPENDENT_AMBULATORY_CARE_PROVIDER_SITE_OTHER): Payer: Self-pay | Admitting: Vascular Surgery

## 2024-08-17 DIAGNOSIS — I739 Peripheral vascular disease, unspecified: Secondary | ICD-10-CM

## 2024-08-18 ENCOUNTER — Encounter (INDEPENDENT_AMBULATORY_CARE_PROVIDER_SITE_OTHER): Payer: Self-pay | Admitting: Vascular Surgery

## 2024-08-18 ENCOUNTER — Ambulatory Visit (INDEPENDENT_AMBULATORY_CARE_PROVIDER_SITE_OTHER): Payer: Self-pay | Admitting: Vascular Surgery

## 2024-08-18 ENCOUNTER — Other Ambulatory Visit (INDEPENDENT_AMBULATORY_CARE_PROVIDER_SITE_OTHER)

## 2024-08-18 VITALS — BP 120/80 | HR 100 | Ht 59.0 in | Wt 155.2 lb

## 2024-08-18 DIAGNOSIS — Z720 Tobacco use: Secondary | ICD-10-CM

## 2024-08-18 DIAGNOSIS — I739 Peripheral vascular disease, unspecified: Secondary | ICD-10-CM

## 2024-08-18 DIAGNOSIS — K219 Gastro-esophageal reflux disease without esophagitis: Secondary | ICD-10-CM

## 2024-08-18 DIAGNOSIS — I10 Essential (primary) hypertension: Secondary | ICD-10-CM

## 2024-08-18 DIAGNOSIS — E782 Mixed hyperlipidemia: Secondary | ICD-10-CM | POA: Diagnosis not present

## 2024-08-18 NOTE — H&P (View-Only) (Signed)
 Subjective:    Patient ID: Krystal Lee, female    DOB: [birth year], 68 y.o.   MRN: [id number] Chief Complaint  Patient presents with   Follow-up    Krystal Lee is a 68 yo female who presents to clinic today as a new patient for bilateral lower extremity pain.  Patient has been seen by Dr. Selinda Gu MD in the past.  Back in 2014 patient underwent an aortogram with iliofemoral angiogram.  She she claims she had stents placed to her left leg.  Reading back to the operative note she had angioplasty to the left common femoral artery as well as external iliac artery on the same side.  I do not see any history of stents.  Patient is a chronic smoker stating she smokes about half pack to a pack of cigarettes a day.  Looking through her history she has multiple ER visits for COPD exacerbations.  She endorses she has been asked to quit multiple times but just cannot seem to do so.  She states that the pain to her legs started to get bad 6 months ago and has recently gotten bad about 3 months ago.  She finally has gotten a visit to come see us .  She would like us  to address the bilateral lower extremity pain to her legs at rest as well as on ambulation.  She has no swelling to bilateral lower extremities or open wounds or ulcers today.  Patient underwent bilateral lower extremity arterial duplex ultrasounds today.  Right ABI is 0.50 with monophasic waveforms.  No previous ABI Left ABI is 0.58 with monophasic waveforms.  No previous ABI    Review of Systems  Constitutional: Negative.   Respiratory:  Positive for cough and wheezing.        Chronic smoker with 1 pack a day history.  Musculoskeletal:  Positive for gait problem and myalgias.       Currently having bilateral lower extremity leg pain with myalgias at rest as well as on ambulation.  All other systems reviewed and are negative.      Objective:   Physical Exam Constitutional:      Appearance: Normal appearance. She is obese.  HENT:      Head: Normocephalic.  Eyes:     Pupils: Pupils are equal, round, and reactive to light.  Cardiovascular:     Rate and Rhythm: Normal rate and regular rhythm.     Pulses: Normal pulses.     Heart sounds: Normal heart sounds.  Pulmonary:     Effort: Pulmonary effort is normal.     Breath sounds: Wheezing and rhonchi present.     Comments: Chronic COPD history with long history of smoking. Abdominal:     General: Abdomen is flat. Bowel sounds are normal.     Palpations: Abdomen is soft.  Musculoskeletal:        General: Tenderness present.     Comments: Tenderness to bilateral lower extremities and hips.  Skin:    General: Skin is warm and dry.     Capillary Refill: Capillary refill takes more than 3 seconds.  Neurological:     General: No focal deficit present.     Mental Status: She is alert and oriented to person, place, and time. Mental status is at baseline.  Psychiatric:        Mood and Affect: Mood normal.        Behavior: Behavior normal.        Thought Content: Thought content  normal.        Judgment: Judgment normal.     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 Babara oncologist    Social History   Socioeconomic  History   Marital status: Widowed    Spouse name: Not on file   Number of children: Not on file   Years of education: Not on file   Highest education level: Not on file  Occupational History   Not on file  Tobacco Use   Smoking status: Every Day    Current packs/day: 0.00    Average packs/day: 1 pack/day for 45.0 years (45.0 ttl pk-yrs)    Types: Cigarettes    Start date: 05/01/1976    Last attempt to quit: 05/01/2021    Years since quitting: 3.3   Smokeless tobacco: Never   Tobacco comments:    Started smoking at 68 yrs old.    10 cigarettes a day  khj 01/02/2024  Vaping Use   Vaping status: Never Used  Substance and Sexual Activity   Alcohol use: Not Currently    Alcohol/week: 1.0 standard drink of alcohol    Types: 1 Cans of beer per week   Drug use: No    Comment: prescribed xanax    Sexual activity: Not on file  Other Topics Concern   Not on file  Social History Narrative   Not on file   Social Drivers of Health   Financial Resource Strain: Low Risk  (05/06/2024)   Received from [facility]   Overall Financial Resource Strain (CARDIA)    Difficulty of Paying Living Expenses: Not very hard  Food Insecurity: Food Insecurity Present (05/06/2024)   Received from [facility]   Hunger Vital Sign    Within the past 12 months, you worried that your food would run out before you got the money to buy more.: Sometimes true    Within the past 12 months, the food you bought just didn't last and you didn't have money to get more.: Sometimes true  Transportation Needs: No Transportation Needs (05/06/2024)   Received from [facility] - Transportation    In the past 12 months, has lack of transportation kept you from medical appointments or from getting medications?: No    Lack of Transportation (Non-Medical): No  Physical Activity: Unknown (01/21/2019)   Received from [facility]   Exercise Vital Sign    Days of  Exercise per Week: 0 days    Minutes of Exercise per Session: Patient declined  Stress: Stress Concern Present (  01/21/2019)   Received from [facility] - Occupational Stress Questionnaire    Feeling of Stress : Very much  Social Connections: Unknown (01/21/2019)   Received from [facility]   Social Connection and Isolation Panel    Frequency of Communication with Friends and Family: Patient declined    Frequency of Social Gatherings with Friends and Family: Patient declined    Attends Religious Services: Never    Database administrator or Organizations: No    Attends Engineer, structural: Patient declined    Marital Status: Patient declined  Catering manager Violence: Not At Risk (11/22/2022)   Humiliation, Afraid, Rape, and Kick questionnaire    Fear of Current or Ex-Partner: No    Emotionally Abused: No    Physically Abused: No    Sexually Abused: No    Past Surgical History:  Procedure Laterality Date   CATARACT EXTRACTION  2010   right    CATARACT EXTRACTION W/PHACO Left 06/09/2019   Procedure: CATARACT EXTRACTION PHACO AND INTRAOCULAR LENS PLACEMENT (IOC) COMPLICATED  LEFT;  Surgeon: Jaye Fallow, MD;  Location: [facility];  Service: Ophthalmology;  Laterality: Left;  VISION BLUE  [place]    COLONOSCOPY N/A 05/12/2024   Procedure: COLONOSCOPY;  Surgeon: Therisa Bi, MD;  Location: [facility] ENDOSCOPY;  Service: Gastroenterology;  Laterality: N/A;   ENDOBRONCHIAL ULTRASOUND Right 01/14/2024   Procedure: ENDOBRONCHIAL ULTRASOUND;  Surgeon: Tamea Dedra CROME, MD;  Location: [facility] ORS;  Service: Pulmonary;  Laterality: Right;   ESOPHAGOGASTRODUODENOSCOPY N/A 05/12/2024   Procedure: EGD (ESOPHAGOGASTRODUODENOSCOPY);  Surgeon: Therisa Bi, MD;  Location: [facility] ENDOSCOPY;  Service: Gastroenterology;  Laterality: N/A;   HEMOSTASIS CLIP PLACEMENT  05/12/2024   Procedure: CONTROL OF HEMORRHAGE, GI TRACT, ENDOSCOPIC, BY CLIPPING  OR OVERSEWING;  Surgeon: Therisa Bi, MD;  Location: [facility] ENDOSCOPY;  Service: Gastroenterology;;   LOWER EXTREMITY ANGIOGRAPHY Bilateral 08/31/2013   [facility], Dr. Marea   POLYPECTOMY  05/12/2024   Procedure: POLYPECTOMY, INTESTINE;  Surgeon: Therisa Bi, MD;  Location: [facility] ENDOSCOPY;  Service: Gastroenterology;;    Family History  Problem Relation Age of Onset   Breast cancer Paternal Grandmother    Bone cancer Paternal Grandmother    Pancreatic cancer Mother    Heart disease Father    Heart disease Paternal Uncle    Stroke Paternal Grandfather     No Known Allergies     Latest Ref Rng & Units 01/23/2024    4:12 PM 07/05/2023    1:10 PM 06/18/2023    6:43 PM  CBC  WBC 4.0 - 10.5 K/uL 7.4  10.9  11.9   Hemoglobin 12.0 - 15.0 g/dL 87.2  87.8  86.4   Hematocrit 36.0 - 46.0 % 37.0  37.0  39.5   Platelets 150 - 400 K/uL 267  215  249       CMP     Component Value Date/Time   NA 140 01/23/2024 1612   NA 136 11/04/2013 0529   K 3.9 01/23/2024 1612   K 2.6 (L) 11/04/2013 0529   CL 108 01/23/2024 1612   CL 94 (L) 11/04/2013 0529   CO2 22 01/23/2024 1612   CO2 37 (H) 11/04/2013 0529   GLUCOSE 113 (H) 01/23/2024 1612   GLUCOSE 175 (H) 11/04/2013 0529   BUN 14 01/23/2024 1612   BUN 23 (H) 11/04/2013 0529   CREATININE 0.61 01/23/2024 1612   CREATININE 0.56 (L) 11/04/2013 0529   CALCIUM  9.6 01/23/2024 1612   CALCIUM   9.0 11/04/2013 0529   PROT 6.5 07/05/2023 1310   PROT 7.5 10/26/2013 0146   ALBUMIN 3.3 (L) 07/05/2023 1310   ALBUMIN 3.8 10/26/2013 0146   AST 13 (L) 07/05/2023 1310   AST 38 (H) 10/26/2013 0146   ALT 12 07/05/2023 1310   ALT 33 10/26/2013 0146   ALKPHOS 65 07/05/2023 1310   ALKPHOS 112 10/26/2013 0146   BILITOT 0.5 07/05/2023 1310   BILITOT 0.3 10/26/2013 0146   GFRNONAA >60 01/23/2024 1612   GFRNONAA >60 11/04/2013 0529     No results found.     Assessment & Plan:   1. Claudication of both lower extremities (Primary) Recommend:  The patient has  experienced increased claudication symptoms and is now describing lifestyle limiting claudication and appears to be having mild rest pain symptroms.  Given the severity of the patient's severe bilateral lower extremity symptoms the patient should undergo angiography with the hope for intervention.  Risk and benefits were reviewed the patient.  Indications for the procedure were reviewed.  All questions were answered, the patient agrees to proceed with Aortogram with bilateral lower extremity angiography and possible intervention.   The patient should continue walking and begin a more formal exercise program.  The patient should continue antiplatelet therapy and aggressive treatment of the lipid abnormalities  The patient will follow up with me after the angiogram.   Patient underwent arterial duplex ultrasounds of her bilateral lower extremities with ABIs today.  Right ABI is 0.50 with monophasic waveforms.  No prior ABI Left ABI is 0.58 with monophasic waveforms.  No prior ABI  2. Primary hypertension Continue antihypertensive medications as already ordered, these medications have been reviewed and there are no changes at this time.  3. Gastroesophageal reflux disease without esophagitis Continue PPI as already ordered, this medication has been reviewed and there are no changes at this time.  Avoidence of caffeine  and alcohol  Moderate elevation of the head of the bed   4. Mixed hyperlipidemia Continue statin as ordered and reviewed, no changes at this time  5. Tobacco abuse I had a detailed 10 to 15-minute conversation with the patient regarding her smoking and how it affects her vasculature.  I offered her smoking cessation today which she refuses.  She states she is working on it by herself.   Current Outpatient Medications on File Prior to Visit  Medication Sig Dispense Refill   albuterol  (PROVENTIL ) (2.5 MG/3ML) 0.083% nebulizer solution Take 2.5 mg by nebulization every 6  (six) hours as needed for wheezing or shortness of breath.     ALPRAZolam  (XANAX ) 0.5 MG tablet Take 0.5 mg by mouth 3 (three) times daily as needed for anxiety.     cholecalciferol  (VITAMIN D3) 25 MCG (1000 UNIT) tablet Take 1,000 Units by mouth daily.     Fluticasone -Umeclidin-Vilant (TRELEGY ELLIPTA) 200-62.5-25 MCG/ACT AEPB Inhale into the lungs.     furosemide  (LASIX ) 20 MG tablet Take 1 tablet (20 mg total) by mouth daily. 30 tablet    gabapentin (NEURONTIN) 300 MG capsule Take 300 mg by mouth daily.     ibuprofen  (ADVIL ) 600 MG tablet Take 600 mg by mouth 2 (two) times daily as needed.     levothyroxine (SYNTHROID) 50 MCG tablet Take 50 mcg by mouth daily.     montelukast  (SINGULAIR ) 10 MG tablet Take 10 mg by mouth at bedtime.     pantoprazole  (PROTONIX ) 40 MG tablet Take 1 tablet (40 mg total) by mouth daily after breakfast. 30 tablet 3  predniSONE  (DELTASONE ) 10 MG tablet TAKE 1 TABLET BY MOUTH ONCE DAILY WITH BREAKFAST 30 tablet 2   QUEtiapine  Fumarate (SEROQUEL  XR) 150 MG 24 hr tablet Take 150 mg by mouth at bedtime.     rosuvastatin  (CRESTOR ) 40 MG tablet Take 40 mg by mouth at bedtime.     valsartan  (DIOVAN ) 80 MG tablet Take 1 tablet (80 mg total) by mouth daily. 30 tablet 0   No current facility-administered medications on file prior to visit.    There are no Patient Instructions on file for this visit. No follow-ups on file.   Gwendlyn JONELLE Shank, NP

## 2024-08-18 NOTE — Progress Notes (Signed)
 Subjective:    Patient ID: Krystal Lee, female    DOB: [birth year], 68 y.o.   MRN: [id number] Chief Complaint  Patient presents with   Follow-up    Krystal Lee is a 68 yo female who presents to clinic today as a new patient for bilateral lower extremity pain.  Patient has been seen by Dr. Selinda Gu MD in the past.  Back in 2014 patient underwent an aortogram with iliofemoral angiogram.  She she claims she had stents placed to her left leg.  Reading back to the operative note she had angioplasty to the left common femoral artery as well as external iliac artery on the same side.  I do not see any history of stents.  Patient is a chronic smoker stating she smokes about half pack to a pack of cigarettes a day.  Looking through her history she has multiple ER visits for COPD exacerbations.  She endorses she has been asked to quit multiple times but just cannot seem to do so.  She states that the pain to her legs started to get bad 6 months ago and has recently gotten bad about 3 months ago.  She finally has gotten a visit to come see us .  She would like us  to address the bilateral lower extremity pain to her legs at rest as well as on ambulation.  She has no swelling to bilateral lower extremities or open wounds or ulcers today.  Patient underwent bilateral lower extremity arterial duplex ultrasounds today.  Right ABI is 0.50 with monophasic waveforms.  No previous ABI Left ABI is 0.58 with monophasic waveforms.  No previous ABI    Review of Systems  Constitutional: Negative.   Respiratory:  Positive for cough and wheezing.        Chronic smoker with 1 pack a day history.  Musculoskeletal:  Positive for gait problem and myalgias.       Currently having bilateral lower extremity leg pain with myalgias at rest as well as on ambulation.  All other systems reviewed and are negative.      Objective:   Physical Exam Constitutional:      Appearance: Normal appearance. She is obese.  HENT:      Head: Normocephalic.  Eyes:     Pupils: Pupils are equal, round, and reactive to light.  Cardiovascular:     Rate and Rhythm: Normal rate and regular rhythm.     Pulses: Normal pulses.     Heart sounds: Normal heart sounds.  Pulmonary:     Effort: Pulmonary effort is normal.     Breath sounds: Wheezing and rhonchi present.     Comments: Chronic COPD history with long history of smoking. Abdominal:     General: Abdomen is flat. Bowel sounds are normal.     Palpations: Abdomen is soft.  Musculoskeletal:        General: Tenderness present.     Comments: Tenderness to bilateral lower extremities and hips.  Skin:    General: Skin is warm and dry.     Capillary Refill: Capillary refill takes more than 3 seconds.  Neurological:     General: No focal deficit present.     Mental Status: She is alert and oriented to person, place, and time. Mental status is at baseline.  Psychiatric:        Mood and Affect: Mood normal.        Behavior: Behavior normal.        Thought Content: Thought content  normal.        Judgment: Judgment normal.     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 Babara oncologist    Social History   Socioeconomic  History   Marital status: Widowed    Spouse name: Not on file   Number of children: Not on file   Years of education: Not on file   Highest education level: Not on file  Occupational History   Not on file  Tobacco Use   Smoking status: Every Day    Current packs/day: 0.00    Average packs/day: 1 pack/day for 45.0 years (45.0 ttl pk-yrs)    Types: Cigarettes    Start date: 05/01/1976    Last attempt to quit: 05/01/2021    Years since quitting: 3.3   Smokeless tobacco: Never   Tobacco comments:    Started smoking at 68 yrs old.    10 cigarettes a day  khj 01/02/2024  Vaping Use   Vaping status: Never Used  Substance and Sexual Activity   Alcohol use: Not Currently    Alcohol/week: 1.0 standard drink of alcohol    Types: 1 Cans of beer per week   Drug use: No    Comment: prescribed xanax    Sexual activity: Not on file  Other Topics Concern   Not on file  Social History Narrative   Not on file   Social Drivers of Health   Financial Resource Strain: Low Risk  (05/06/2024)   Received from [facility]   Overall Financial Resource Strain (CARDIA)    Difficulty of Paying Living Expenses: Not very hard  Food Insecurity: Food Insecurity Present (05/06/2024)   Received from [facility]   Hunger Vital Sign    Within the past 12 months, you worried that your food would run out before you got the money to buy more.: Sometimes true    Within the past 12 months, the food you bought just didn't last and you didn't have money to get more.: Sometimes true  Transportation Needs: No Transportation Needs (05/06/2024)   Received from [facility] - Transportation    In the past 12 months, has lack of transportation kept you from medical appointments or from getting medications?: No    Lack of Transportation (Non-Medical): No  Physical Activity: Unknown (01/21/2019)   Received from [facility]   Exercise Vital Sign    Days of  Exercise per Week: 0 days    Minutes of Exercise per Session: Patient declined  Stress: Stress Concern Present (  01/21/2019)   Received from [facility] - Occupational Stress Questionnaire    Feeling of Stress : Very much  Social Connections: Unknown (01/21/2019)   Received from [facility]   Social Connection and Isolation Panel    Frequency of Communication with Friends and Family: Patient declined    Frequency of Social Gatherings with Friends and Family: Patient declined    Attends Religious Services: Never    Database administrator or Organizations: No    Attends Engineer, structural: Patient declined    Marital Status: Patient declined  Catering manager Violence: Not At Risk (11/22/2022)   Humiliation, Afraid, Rape, and Kick questionnaire    Fear of Current or Ex-Partner: No    Emotionally Abused: No    Physically Abused: No    Sexually Abused: No    Past Surgical History:  Procedure Laterality Date   CATARACT EXTRACTION  2010   right    CATARACT EXTRACTION W/PHACO Left 06/09/2019   Procedure: CATARACT EXTRACTION PHACO AND INTRAOCULAR LENS PLACEMENT (IOC) COMPLICATED  LEFT;  Surgeon: Jaye Fallow, MD;  Location: [facility];  Service: Ophthalmology;  Laterality: Left;  VISION BLUE  [place]    COLONOSCOPY N/A 05/12/2024   Procedure: COLONOSCOPY;  Surgeon: Therisa Bi, MD;  Location: [facility] ENDOSCOPY;  Service: Gastroenterology;  Laterality: N/A;   ENDOBRONCHIAL ULTRASOUND Right 01/14/2024   Procedure: ENDOBRONCHIAL ULTRASOUND;  Surgeon: Tamea Dedra CROME, MD;  Location: [facility] ORS;  Service: Pulmonary;  Laterality: Right;   ESOPHAGOGASTRODUODENOSCOPY N/A 05/12/2024   Procedure: EGD (ESOPHAGOGASTRODUODENOSCOPY);  Surgeon: Therisa Bi, MD;  Location: [facility] ENDOSCOPY;  Service: Gastroenterology;  Laterality: N/A;   HEMOSTASIS CLIP PLACEMENT  05/12/2024   Procedure: CONTROL OF HEMORRHAGE, GI TRACT, ENDOSCOPIC, BY CLIPPING  OR OVERSEWING;  Surgeon: Therisa Bi, MD;  Location: [facility] ENDOSCOPY;  Service: Gastroenterology;;   LOWER EXTREMITY ANGIOGRAPHY Bilateral 08/31/2013   [facility], Dr. Marea   POLYPECTOMY  05/12/2024   Procedure: POLYPECTOMY, INTESTINE;  Surgeon: Therisa Bi, MD;  Location: [facility] ENDOSCOPY;  Service: Gastroenterology;;    Family History  Problem Relation Age of Onset   Breast cancer Paternal Grandmother    Bone cancer Paternal Grandmother    Pancreatic cancer Mother    Heart disease Father    Heart disease Paternal Uncle    Stroke Paternal Grandfather     No Known Allergies     Latest Ref Rng & Units 01/23/2024    4:12 PM 07/05/2023    1:10 PM 06/18/2023    6:43 PM  CBC  WBC 4.0 - 10.5 K/uL 7.4  10.9  11.9   Hemoglobin 12.0 - 15.0 g/dL 87.2  87.8  86.4   Hematocrit 36.0 - 46.0 % 37.0  37.0  39.5   Platelets 150 - 400 K/uL 267  215  249       CMP     Component Value Date/Time   NA 140 01/23/2024 1612   NA 136 11/04/2013 0529   K 3.9 01/23/2024 1612   K 2.6 (L) 11/04/2013 0529   CL 108 01/23/2024 1612   CL 94 (L) 11/04/2013 0529   CO2 22 01/23/2024 1612   CO2 37 (H) 11/04/2013 0529   GLUCOSE 113 (H) 01/23/2024 1612   GLUCOSE 175 (H) 11/04/2013 0529   BUN 14 01/23/2024 1612   BUN 23 (H) 11/04/2013 0529   CREATININE 0.61 01/23/2024 1612   CREATININE 0.56 (L) 11/04/2013 0529   CALCIUM  9.6 01/23/2024 1612   CALCIUM   9.0 11/04/2013 0529   PROT 6.5 07/05/2023 1310   PROT 7.5 10/26/2013 0146   ALBUMIN 3.3 (L) 07/05/2023 1310   ALBUMIN 3.8 10/26/2013 0146   AST 13 (L) 07/05/2023 1310   AST 38 (H) 10/26/2013 0146   ALT 12 07/05/2023 1310   ALT 33 10/26/2013 0146   ALKPHOS 65 07/05/2023 1310   ALKPHOS 112 10/26/2013 0146   BILITOT 0.5 07/05/2023 1310   BILITOT 0.3 10/26/2013 0146   GFRNONAA >60 01/23/2024 1612   GFRNONAA >60 11/04/2013 0529     No results found.     Assessment & Plan:   1. Claudication of both lower extremities (Primary) Recommend:  The patient has  experienced increased claudication symptoms and is now describing lifestyle limiting claudication and appears to be having mild rest pain symptroms.  Given the severity of the patient's severe bilateral lower extremity symptoms the patient should undergo angiography with the hope for intervention.  Risk and benefits were reviewed the patient.  Indications for the procedure were reviewed.  All questions were answered, the patient agrees to proceed with Aortogram with bilateral lower extremity angiography and possible intervention.   The patient should continue walking and begin a more formal exercise program.  The patient should continue antiplatelet therapy and aggressive treatment of the lipid abnormalities  The patient will follow up with me after the angiogram.   Patient underwent arterial duplex ultrasounds of her bilateral lower extremities with ABIs today.  Right ABI is 0.50 with monophasic waveforms.  No prior ABI Left ABI is 0.58 with monophasic waveforms.  No prior ABI  2. Primary hypertension Continue antihypertensive medications as already ordered, these medications have been reviewed and there are no changes at this time.  3. Gastroesophageal reflux disease without esophagitis Continue PPI as already ordered, this medication has been reviewed and there are no changes at this time.  Avoidence of caffeine  and alcohol  Moderate elevation of the head of the bed   4. Mixed hyperlipidemia Continue statin as ordered and reviewed, no changes at this time  5. Tobacco abuse I had a detailed 10 to 15-minute conversation with the patient regarding her smoking and how it affects her vasculature.  I offered her smoking cessation today which she refuses.  She states she is working on it by herself.   Current Outpatient Medications on File Prior to Visit  Medication Sig Dispense Refill   albuterol  (PROVENTIL ) (2.5 MG/3ML) 0.083% nebulizer solution Take 2.5 mg by nebulization every 6  (six) hours as needed for wheezing or shortness of breath.     ALPRAZolam  (XANAX ) 0.5 MG tablet Take 0.5 mg by mouth 3 (three) times daily as needed for anxiety.     cholecalciferol  (VITAMIN D3) 25 MCG (1000 UNIT) tablet Take 1,000 Units by mouth daily.     Fluticasone -Umeclidin-Vilant (TRELEGY ELLIPTA) 200-62.5-25 MCG/ACT AEPB Inhale into the lungs.     furosemide  (LASIX ) 20 MG tablet Take 1 tablet (20 mg total) by mouth daily. 30 tablet    gabapentin (NEURONTIN) 300 MG capsule Take 300 mg by mouth daily.     ibuprofen  (ADVIL ) 600 MG tablet Take 600 mg by mouth 2 (two) times daily as needed.     levothyroxine (SYNTHROID) 50 MCG tablet Take 50 mcg by mouth daily.     montelukast  (SINGULAIR ) 10 MG tablet Take 10 mg by mouth at bedtime.     pantoprazole  (PROTONIX ) 40 MG tablet Take 1 tablet (40 mg total) by mouth daily after breakfast. 30 tablet 3  predniSONE  (DELTASONE ) 10 MG tablet TAKE 1 TABLET BY MOUTH ONCE DAILY WITH BREAKFAST 30 tablet 2   QUEtiapine  Fumarate (SEROQUEL  XR) 150 MG 24 hr tablet Take 150 mg by mouth at bedtime.     rosuvastatin  (CRESTOR ) 40 MG tablet Take 40 mg by mouth at bedtime.     valsartan  (DIOVAN ) 80 MG tablet Take 1 tablet (80 mg total) by mouth daily. 30 tablet 0   No current facility-administered medications on file prior to visit.    There are no Patient Instructions on file for this visit. No follow-ups on file.   Gwendlyn JONELLE Shank, NP

## 2024-08-19 ENCOUNTER — Telehealth (INDEPENDENT_AMBULATORY_CARE_PROVIDER_SITE_OTHER): Payer: Self-pay

## 2024-08-19 ENCOUNTER — Ambulatory Visit
Admission: RE | Admit: 2024-08-19 | Discharge: 2024-08-19 | Disposition: A | Discharge status: Home / Self Care | Source: Ambulatory Visit | Attending: Pulmonary Disease | Admitting: Pulmonary Disease

## 2024-08-19 ENCOUNTER — Other Ambulatory Visit

## 2024-08-19 DIAGNOSIS — J984 Other disorders of lung: Secondary | ICD-10-CM | POA: Diagnosis present

## 2024-08-19 NOTE — Telephone Encounter (Signed)
 Spoke with the patient and she is scheduled with Dr. Marea for a bilateral leg angio's RLE (08/24/24 - 10:30 am), LLE (08/31/24 - 8:30 am). Pre-procedure instructions were discussed and will be sent to Mychart and mailed.

## 2024-08-21 LAB — VAS US ABI WITH/WO TBI
Left ABI: 0.58
Right ABI: 0.5

## 2024-08-24 ENCOUNTER — Ambulatory Visit

## 2024-08-24 ENCOUNTER — Other Ambulatory Visit: Payer: Self-pay

## 2024-08-24 ENCOUNTER — Ambulatory Visit
Admission: RE | Admit: 2024-08-24 | Discharge: 2024-08-24 | Disposition: A | Discharge status: Home / Self Care | Attending: Vascular Surgery | Admitting: Vascular Surgery

## 2024-08-24 ENCOUNTER — Encounter: Payer: Self-pay | Admitting: Vascular Surgery

## 2024-08-24 ENCOUNTER — Encounter
Admission: RE | Disposition: A | Discharge status: Home / Self Care | Payer: Self-pay | Source: Home / Self Care | Attending: Vascular Surgery

## 2024-08-24 DIAGNOSIS — Z5941 Food insecurity: Secondary | ICD-10-CM | POA: Insufficient documentation

## 2024-08-24 DIAGNOSIS — E782 Mixed hyperlipidemia: Secondary | ICD-10-CM | POA: Insufficient documentation

## 2024-08-24 DIAGNOSIS — I745 Embolism and thrombosis of iliac artery: Secondary | ICD-10-CM | POA: Insufficient documentation

## 2024-08-24 DIAGNOSIS — F17210 Nicotine dependence, cigarettes, uncomplicated: Secondary | ICD-10-CM | POA: Insufficient documentation

## 2024-08-24 DIAGNOSIS — K219 Gastro-esophageal reflux disease without esophagitis: Secondary | ICD-10-CM | POA: Insufficient documentation

## 2024-08-24 DIAGNOSIS — I10 Essential (primary) hypertension: Secondary | ICD-10-CM | POA: Diagnosis not present

## 2024-08-24 DIAGNOSIS — I700 Atherosclerosis of aorta: Secondary | ICD-10-CM | POA: Diagnosis not present

## 2024-08-24 DIAGNOSIS — I70223 Atherosclerosis of native arteries of extremities with rest pain, bilateral legs: Secondary | ICD-10-CM | POA: Diagnosis not present

## 2024-08-24 DIAGNOSIS — Z79899 Other long term (current) drug therapy: Secondary | ICD-10-CM | POA: Diagnosis not present

## 2024-08-24 DIAGNOSIS — I70229 Atherosclerosis of native arteries of extremities with rest pain, unspecified extremity: Secondary | ICD-10-CM | POA: Diagnosis present

## 2024-08-24 HISTORY — PX: LOWER EXTREMITY ANGIOGRAPHY: CATH118251

## 2024-08-24 LAB — CREATININE, SERUM
Creatinine, Ser: 0.76 mg/dL (ref 0.44–1.00)
GFR, Estimated: >60 mL/min (ref >60)

## 2024-08-24 LAB — BUN: BUN: 19 mg/dL (ref 8–23)

## 2024-08-24 SURGERY — LOWER EXTREMITY ANGIOGRAPHY
Anesthesia: Moderate Sedation | Site: Leg Lower | Laterality: Right

## 2024-08-24 MED ORDER — IODIXANOL 320 MG/ML IV SOLN
INTRAVENOUS | Status: DC | PRN
  Administered 2024-08-24: 40 mL via INTRA_ARTERIAL

## 2024-08-24 MED ORDER — FENTANYL CITRATE (PF) 100 MCG/2ML IJ SOLN
INTRAMUSCULAR | Status: AC
  Filled 2024-08-24: qty 2

## 2024-08-24 MED ORDER — MIDAZOLAM HCL 2 MG/ML PO SYRP: 8.0000 mg | ORAL_SOLUTION | Freq: Once | ORAL | Status: DC | PRN

## 2024-08-24 MED ORDER — FAMOTIDINE 20 MG PO TABS: 40.0000 mg | ORAL_TABLET | Freq: Once | ORAL | Status: DC | PRN

## 2024-08-24 MED ORDER — METHYLPREDNISOLONE SODIUM SUCC 125 MG IJ SOLR: 125.0000 mg | Freq: Once | INTRAMUSCULAR | Status: DC | PRN

## 2024-08-24 MED ORDER — MIDAZOLAM HCL 2 MG/2ML IJ SOLN
INTRAMUSCULAR | Status: AC
  Filled 2024-08-24: qty 2

## 2024-08-24 MED ORDER — FENTANYL CITRATE (PF) 100 MCG/2ML IJ SOLN
INTRAMUSCULAR | Status: DC | PRN
  Administered 2024-08-24: 25 ug via INTRAVENOUS
  Administered 2024-08-24: 50 ug via INTRAVENOUS
  Administered 2024-08-24: 25 ug via INTRAVENOUS

## 2024-08-24 MED ORDER — HEPARIN (PORCINE) IN NACL 1000-0.9 UT/500ML-% IV SOLN
INTRAVENOUS | Status: DC | PRN
  Administered 2024-08-24: 1000 mL

## 2024-08-24 MED ORDER — HYDROMORPHONE HCL 1 MG/ML IJ SOLN
INTRAMUSCULAR | Status: AC
  Filled 2024-08-24: qty 1

## 2024-08-24 MED ORDER — IOHEXOL 350 MG/ML SOLN
100.0000 mL | Freq: Once | INTRAVENOUS | Status: AC | PRN
  Administered 2024-08-24: 85 mL via INTRAVENOUS

## 2024-08-24 MED ORDER — SODIUM CHLORIDE 0.9 % IV SOLN
INTRAVENOUS | Status: DC
Start: 2024-08-24 — End: 2024-08-24

## 2024-08-24 MED ORDER — MIDAZOLAM HCL 2 MG/2ML IJ SOLN
INTRAMUSCULAR | Status: DC | PRN
  Administered 2024-08-24: .5 mg via INTRAVENOUS
  Administered 2024-08-24: 2 mg via INTRAVENOUS
  Administered 2024-08-24 (×2): .5 mg via INTRAVENOUS

## 2024-08-24 MED ORDER — LIDOCAINE-EPINEPHRINE (PF) 1 %-1:200000 IJ SOLN
INTRAMUSCULAR | Status: DC | PRN
  Administered 2024-08-24: 10 mL

## 2024-08-24 MED ORDER — HYDROMORPHONE HCL 1 MG/ML IJ SOLN
1.0000 mg | Freq: Once | INTRAMUSCULAR | Status: AC | PRN
  Administered 2024-08-24: 1 mg via INTRAVENOUS

## 2024-08-24 MED ORDER — ONDANSETRON HCL 4 MG/2ML IJ SOLN: 4.0000 mg | Freq: Four times a day (QID) | INTRAMUSCULAR | Status: DC | PRN

## 2024-08-24 MED ORDER — CEFAZOLIN SODIUM-DEXTROSE 2-4 GM/100ML-% IV SOLN
2.0000 g | INTRAVENOUS | Status: AC
  Administered 2024-08-24: 2 g via INTRAVENOUS

## 2024-08-24 MED ORDER — DIPHENHYDRAMINE HCL 50 MG/ML IJ SOLN: 50.0000 mg | Freq: Once | INTRAMUSCULAR | Status: DC | PRN

## 2024-08-24 SURGICAL SUPPLY — 11 items
CATH ANGIO 5F PIGTAIL 65CM (CATHETERS) IMPLANT
CATH BEACON 5 .035 40 KMP TP (CATHETERS) IMPLANT
COVER PROBE ULTRASOUND 5X96 (MISCELLANEOUS) IMPLANT
DEVICE TORQUE (MISCELLANEOUS) IMPLANT
GLIDEWIRE ADV .035X260CM (WIRE) IMPLANT
KIT MICROPUNCTURE VSI 5F STIFF (SHEATH) IMPLANT
PACK ANGIOGRAPHY (CUSTOM PROCEDURE TRAY) ×1 IMPLANT
SHEATH BRITE TIP 5FRX11 (SHEATH) IMPLANT
SYR MEDRAD MARK 7 150ML (SYRINGE) IMPLANT
TUBING CONTRAST HIGH PRESS 72 (TUBING) IMPLANT
WIRE J 3MM .035X145CM (WIRE) IMPLANT

## 2024-08-24 NOTE — Op Note (Signed)
 [facility]  Percutaneous Study/Intervention Procedural Note   Date of Surgery: 08/24/2024  Surgeon(s):Deylan Canterbury    Assistants:none  Pre-operative Diagnosis: PAD with rest pain bilateral lower extremity  Post-operative diagnosis:  Same  Procedure(s) Performed:             1.  Ultrasound guidance for vascular access bilateral femoral arteries             2.  Catheter placement into aorta from the left femoral approach             3.  Aortogram and selective bilateral lower extremity angiogram              EBL: 10 cc  Contrast: 40 cc  Fluoro Time: 4.9 minutes  Moderate Conscious Sedation Time: approximately 27 minutes using 3.5 mg of Versed  and 100 mcg of Fentanyl               Indications:  Patient is a 68 y.o.female with a long history of peripheral arterial disease with known aortoiliac disease over a decade ago.  She now has developed symptoms worrisome for rest pain on each side. The patient has noninvasive study showing markedly reduced ABIs in the 0.5-0.6 range bilaterally. The patient is brought in for angiography for further evaluation and potential treatment.  Due to the limb threatening nature of the situation, angiogram was performed for attempted limb salvage. The patient is aware that if the procedure fails, amputation would be expected.  The patient also understands that even with successful revascularization, amputation may still be required due to the severity of the situation.  Risks and benefits are discussed and informed consent is obtained.   Procedure:  The patient was identified and appropriate procedural time out was performed.  The patient was then placed supine on the table and prepped and draped in the usual sterile fashion. Moderate conscious sedation was administered during a face to face encounter with the patient throughout the procedure with my supervision of the RN administering medicines and monitoring the patient's vital signs,  pulse oximetry, telemetry and mental status throughout from the start of the procedure until the patient was taken to the recovery room. Ultrasound was used to evaluate the left common femoral artery.  It was patent .  A digital ultrasound image was acquired.  A Seldinger needle was used to access the left common femoral artery under direct ultrasound guidance and a permanent image was performed.  A 0.035 J wire was advanced and a 5Fr sheath was placed.  Imaging through the left femoral sheath showed the left external iliac artery to be small but without stenosis.  The left common iliac artery occluded.  The internal iliac artery did have flow.  I then used a Kumpe catheter and advantage wire and attempted to cross this occlusion.  I can get up into the aorta but can never get back into the true lumen.  The dissection and occlusion plane appeared to continue up near the renal arteries consistent with a long segment aortic occlusion although this was difficult to discern.  I will plan on getting a CT scan for further evaluation of the abdomen and pelvis, go ahead and get good imaging of both lower extremities from the femoral arteries down.  Selective left lower extremity angiogram was then performed.  This demonstrated normal common femoral artery, profunda femoris artery, and superficial femoral artery.  The SFA was patent and continuous distally down to a normal popliteal artery.  There was  a typical tibial trifurcation with three-vessel runoff distally without focal stenosis.  I then accessed the right common femoral artery with direct ultrasound guidance with a micropuncture needle.  A micropuncture wire and sheath were then placed.  Imaging of the right lower extremity was performed through the micropuncture sheath to evaluate her infrainguinal flow on the right side.  The right external iliac artery was patent but there was occlusion of the distal right common iliac artery consistent with her study from over a  decade ago.  This demonstrated normal common femoral artery, profunda femoris artery, and superficial femoral artery.  The SFA was patent and continuous distally down to a normal popliteal artery.  There was a typical tibial trifurcation with three-vessel runoff distally without focal stenosis. I elected to terminate the procedure. The sheaths were removed and pressure was held on each side with excellent hemostatic result. The patient was taken to the recovery room in stable condition having tolerated the procedure well.  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  Unable to get luminal into the aorta to evaluate the renal arteries or visceral vessels.             Left Lower Extremity:  This demonstrated normal common femoral artery, profunda femoris artery, and superficial femoral artery.  The SFA was patent and continuous distally down to a normal popliteal artery.  There was a typical tibial trifurcation with three-vessel runoff distally without focal stenosis.  Right Lower Extremity: This demonstrated normal common femoral artery, profunda femoris artery, and superficial femoral artery.  The SFA was patent and continuous distally down to a normal popliteal artery.  There was a typical tibial trifurcation with three-vessel runoff distally without focal stenosis.   Disposition: Patient was taken to the recovery room in stable condition having tolerated the procedure well.  Complications: None  Selinda Gu 08/24/2024 12:11 PM   This note was created with Dragon Medical transcription system. Any errors in dictation are purely unintentional.

## 2024-08-24 NOTE — Interval H&P Note (Signed)
 History and Physical Interval Note:  08/24/2024 10:50 AM  Krystal Lee  has presented today for surgery, with the diagnosis of RLE Angio   ASO w rest pain.  The various methods of treatment have been discussed with the patient and family. After consideration of risks, benefits and other options for treatment, the patient has consented to  Procedure(s): Lower Extremity Angiography (Right) as a surgical intervention.  The patient's history has been reviewed, patient examined, no change in status, stable for surgery.  I have reviewed the patient's chart and labs.  Questions were answered to the patient's satisfaction.     Hilario Robarts

## 2024-08-25 ENCOUNTER — Ambulatory Visit: Payer: Self-pay | Admitting: Pulmonary Disease

## 2024-08-31 ENCOUNTER — Ambulatory Visit: Admission: RE | Admit: 2024-08-31 | Source: Home / Self Care | Admitting: Vascular Surgery

## 2024-08-31 ENCOUNTER — Encounter: Admission: RE | Payer: Self-pay | Source: Home / Self Care

## 2024-08-31 DIAGNOSIS — I70229 Atherosclerosis of native arteries of extremities with rest pain, unspecified extremity: Secondary | ICD-10-CM

## 2024-08-31 SURGERY — LOWER EXTREMITY ANGIOGRAPHY
Anesthesia: Moderate Sedation | Site: Leg Lower | Laterality: Left

## 2024-09-01 ENCOUNTER — Ambulatory Visit: Admitting: Pulmonary Disease

## 2024-09-01 ENCOUNTER — Encounter: Payer: Self-pay | Admitting: Pulmonary Disease

## 2024-09-01 VITALS — BP 132/64 | HR 102 | Temp 98.0°F | Ht 59.0 in | Wt 159.0 lb

## 2024-09-01 DIAGNOSIS — G4736 Sleep related hypoventilation in conditions classified elsewhere: Secondary | ICD-10-CM | POA: Diagnosis not present

## 2024-09-01 DIAGNOSIS — J984 Other disorders of lung: Secondary | ICD-10-CM | POA: Diagnosis not present

## 2024-09-01 DIAGNOSIS — J449 Chronic obstructive pulmonary disease, unspecified: Secondary | ICD-10-CM | POA: Diagnosis not present

## 2024-09-01 DIAGNOSIS — F17210 Nicotine dependence, cigarettes, uncomplicated: Secondary | ICD-10-CM

## 2024-09-01 DIAGNOSIS — Z23 Encounter for immunization: Secondary | ICD-10-CM | POA: Diagnosis not present

## 2024-09-01 DIAGNOSIS — J439 Emphysema, unspecified: Secondary | ICD-10-CM

## 2024-09-01 MED ORDER — PREDNISONE 5 MG PO TABS: 5.0000 mg | ORAL_TABLET | Freq: Every day | ORAL | 3 refills | Status: DC

## 2024-09-01 NOTE — Progress Notes (Signed)
 Subjective:    Patient ID: Krystal Lee, female    DOB: [birth year], 68 y.o.   MRN: [id number]  Patient Care Team: Donal Channing SQUIBB, FNP as PCP - General (Family Medicine) Donal Channing SQUIBB, FNP (Family Medicine)  Chief Complaint  Patient presents with   COPD    Shortness of breath on exertion.     BACKGROUND/INTERVAL:68 year-old current smoker.  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 Patient was last seen on 20 February 2024.  She had robotic assisted navigational bronchoscopy on 14 January 2024 which showed necrotic material with calcification and chronically inflamed lobulated lung tissue.SABRA    HPI Discussed the use of AI scribe software for clinical note transcription with the patient, who gave verbal consent to proceed.  History of Present Illness   Krystal Lee is a 68 year old female with COPD and lung scarring who presents for follow-up after an episode of necrotizing pneumonia.  She has a history of necrotizing pneumonia, which has resulted in lung scarring.  She has been prescribed oxygen therapy at night, but she has recently stopped using it. She does not experience shortness of breath upon waking. She uses Trelegy for her COPD and uses albuterol  approximately twice a day when engaging in activities such as grocery shopping, but not when she is inactive. She is currently on prednisone  10 mg.  She has a history of smoking and currently smokes about half a pack a day. She finds it difficult to quit smoking, stating 'every time I turn around I take one step forward and three back.'  She recently had all her teeth extracted, which she describes as a 'disaster,' and experienced a reverse reaction to fentanyl  during the procedure, feeling the urge to 'run around the hospital.'  She underwent a colonoscopy which revealed two polyps,  measuring 10 mm and 7 mm. She mentions having been through a lot of medical procedures recently.   Patient desires flu vaccine today.  Review of Systems A 10 point review of systems was performed and it is as noted above otherwise negative.   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 12/12/2023   Nocturnal hypoxemia due to emphysema (HCC) 12/12/2023   Tobacco dependence due to cigarettes 12/12/2023   Abnormal CT of the chest 07/25/2023   Schizophrenia, paranoid (HCC) 09/16/2022   Hallucinations 09/16/2022   Dyspnea 09/16/2022   Atypical chest pain 09/16/2022   Schizophrenia (HCC) 09/16/2022   Bipolar 1 disorder (HCC)    Depression    Anxiety    Hypertension    Sepsis (HCC)    HLD (hyperlipidemia)    GERD (gastroesophageal reflux disease)    Tobacco abuse    COPD exacerbation (HCC) 07/28/2018   Acute on chronic respiratory failure with hypoxia and hypercapnia (HCC) 09/16/2017   COPD with acute exacerbation (HCC) 09/16/2017    Social History   Tobacco Use   Smoking status: Every Day    Current packs/day: 0.00    Average packs/day: 1 pack/day for 45.0 years (45.0 ttl pk-yrs)    Types: Cigarettes    Start date: 05/01/1976  Last attempt to quit: 05/01/2021    Years since quitting: 3.3   Smokeless tobacco: Never   Tobacco comments:    Started smoking at 68 yrs old.    10 cigarettes a day  khj 01/02/2024  Substance Use Topics   Alcohol use: Not Currently    Alcohol/week: 1.0 standard drink of alcohol    Types: 1 Cans of beer per week    No Known Allergies  Current Meds  Medication Sig   albuterol  (PROVENTIL ) (2.5 MG/3ML) 0.083% nebulizer solution Take 2.5 mg by nebulization every 6 (six) hours as needed  for wheezing or shortness of breath.   ALPRAZolam  (XANAX ) 0.5 MG tablet Take 0.5 mg by mouth 3 (three) times daily as needed for anxiety.   cholecalciferol  (VITAMIN D3) 25 MCG (1000 UNIT) tablet Take 1,000 Units by mouth daily.   Fluticasone -Umeclidin-Vilant (TRELEGY ELLIPTA) 200-62.5-25 MCG/ACT AEPB Inhale into the lungs.   furosemide  (LASIX ) 20 MG tablet Take 1 tablet (20 mg total) by mouth daily.   ibuprofen  (ADVIL ) 600 MG tablet Take 600 mg by mouth 2 (two) times daily as needed.   levothyroxine (SYNTHROID) 50 MCG tablet Take 50 mcg by mouth daily.   montelukast  (SINGULAIR ) 10 MG tablet Take 10 mg by mouth at bedtime.   pantoprazole  (PROTONIX ) 40 MG tablet Take 1 tablet (40 mg total) by mouth daily after breakfast.   predniSONE  (DELTASONE ) 5 MG tablet Take 1 tablet (5 mg total) by mouth daily with breakfast.   QUEtiapine  Fumarate (SEROQUEL  XR) 150 MG 24 hr tablet Take 150 mg by mouth at bedtime.   rosuvastatin  (CRESTOR ) 40 MG tablet Take 40 mg by mouth at bedtime.   valsartan  (DIOVAN ) 80 MG tablet Take 1 tablet (80 mg total) by mouth daily.   [DISCONTINUED] predniSONE  (DELTASONE ) 10 MG tablet TAKE 1 TABLET BY MOUTH ONCE DAILY WITH BREAKFAST    Immunization History  Administered Date(s) Administered   INFLUENZA, HIGH DOSE SEASONAL PF 09/01/2024   Influenza, Seasonal, Injecte, Preservative Fre 09/05/2023   Influenza,inj,Quad PF,6+ Mos 09/17/2017   Influenza-Unspecified 08/19/2018, 09/24/2022   Pneumococcal Polysaccharide-23 09/17/2017        Objective:     BP 132/64   Pulse (!) 102   Temp 98 F (36.7 C) (Temporal)   Ht 4' 11 (1.499 m)   Wt 159 lb (72.1 kg)   SpO2 100%   BMI 32.11 kg/m   SpO2: 100 %  GENERAL: Well-developed, overweight woman, no acute distress. Fully ambulatory.  No conversational dyspnea.  Looks older than stated age. HEAD: Normocephalic, atraumatic.  EYES: Pupils equal, round, reactive to light.  No scleral icterus.  MOUTH: Oral mucosa moist.  No  thrush.  Status post multiple teeth extracted. NECK: Supple. No thyromegaly. Trachea midline. No JVD.  No adenopathy. PULMONARY: Good air entry bilaterally.  Coarse, no other adventitious sounds. CARDIOVASCULAR: S1 and S2.  Tachycardic with regular rhythm.  No rubs, murmurs or gallops heard. ABDOMEN: Benign. MUSCULOSKELETAL: No joint deformity, no clubbing, no edema.  NEUROLOGIC: No overt focal deficit, no gait disturbance, speech is fluent. SKIN: Intact,warm,dry.  No rashes.. PSYCH: Mood and behavior normal.  Representative image of chest CT performed 19 August 2024 showing the persistent area of scarring and cavitation on the right upper lobe:    Assessment & Plan:     ICD-10-CM   1. 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 Cavitary lesion of lung  J98.4     3. Nocturnal hypoxemia due to emphysema (HCC)  J43.9 Overnight Pulse Oximetry Study   G47.36 CANCELED: Overnight Pulse Oximetry Study    4. Tobacco dependence due to cigarettes  F17.210     5. Need for influenza vaccination  Z23 Flu vaccine HIGH DOSE PF(Fluzone Trivalent)      Orders Placed This Encounter  Procedures   Pulmonary Report Scanned   Flu vaccine HIGH DOSE PF(Fluzone Trivalent)   Overnight Pulse Oximetry Study    Standing Status:   Future    Expiration Date:   09/01/2025    Scheduling Instructions:     On room air    Meds ordered this encounter  Medications   predniSONE  (DELTASONE ) 5 MG tablet    Sig: Take 1 tablet (5 mg total) by mouth daily with breakfast.    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 Persistent symptoms managed with Trelegy and albuterol  as needed. Current prednisone  dose is 10 mg, with a plan to reduce to 5 mg to prevent  long-term side effects such as osteoporosis. Oxygen use at night is being evaluated, with a plan to recheck oxygen levels during sleep without supplemental oxygen. - Continue Trelegy and ensure mouth rinsing after use - Use albuterol  as needed, especially during activities like grocery shopping - Reduce prednisone  dose to 5 mg - Recheck oxygen levels during sleep without supplemental oxygen - Administer flu shot  Tobacco use disorder (nicotine  dependence, cigarettes) Continues to smoke approximately half a pack per day. Smoking cessation is crucial to improve COPD management and potentially eliminate the need for prednisone . - Encourage smoking cessation to improve COPD management and reduce prednisone  dependency     Smoking/Tobacco Cessation Counseling Krystal Lee is a current user of tobacco or nicotine  products. She is considering quitting at this time. Counseling provided today addressed the risks of continued use and the benefits of cessation. Discussed tobacco/nicotine  use history, readiness to quit, and evidence-based treatment options including behavioral strategies, support resources, and pharmacologic therapies. Provided encouragement and educational materials on steps and resources to quit smoking. Patient questions were addressed, and follow-up recommended for continued support. Total time spent on counseling: 5 minutes.    Will see the patient in follow-up in 4 months time.   Advised if symptoms do not improve or worsen, to please contact office for sooner follow up or seek emergency care.    I spent 30 minutes of dedicated to the care of this patient on the date of this encounter to include pre-visit review of records, face-to-face time with the patient discussing conditions above, post visit ordering of testing, clinical documentation with the electronic health record, making appropriate referrals as documented, and communicating necessary findings to members of the patients  care team.     C. Leita Sanders, MD Advanced Bronchoscopy PCCM [facility]    *This note was generated using voice recognition software/Dragon and/or AI transcription program.  Despite best efforts to proofread, errors can occur which can change the meaning. Any transcriptional errors that result from this process are unintentional and may not be fully corrected at the time of dictation.

## 2024-09-01 NOTE — Patient Instructions (Signed)
 VISIT SUMMARY:  Today, we discussed your ongoing management of COPD and lung scarring following your episode of necrotizing pneumonia. We also reviewed your current medications, smoking habits, and recent medical procedures.  YOUR PLAN:  -CHRONIC OBSTRUCTIVE PULMONARY DISEASE (COPD) WITH PULMONARY FIBROSIS AFTER NECROTIZING PNEUMONIA: COPD is a chronic lung disease that makes it hard to breathe, and pulmonary fibrosis is scarring in the lungs. Your symptoms are being managed with Trelegy and albuterol  as needed. We will reduce your prednisone  dose to 5 mg to prevent long-term side effects. We will also recheck your oxygen levels during sleep without supplemental oxygen. Please continue using Trelegy and rinse your mouth after each use, and use albuterol  as needed, especially during activities like grocery shopping. You will also receive a flu shot.  -TOBACCO USE DISORDER (NICOTINE  DEPENDENCE, CIGARETTES): Smoking can worsen COPD and lung scarring. It is important to quit smoking to improve your COPD management and potentially eliminate the need for prednisone . We encourage you to work on smoking cessation.  INSTRUCTIONS:  We will recheck your oxygen levels during sleep without supplemental oxygen. Please continue to follow the medication plan and work on quitting smoking. You will also receive a flu shot.

## 2024-09-07 ENCOUNTER — Ambulatory Visit: Payer: Self-pay | Admitting: Pulmonary Disease

## 2024-09-07 DIAGNOSIS — J439 Emphysema, unspecified: Secondary | ICD-10-CM

## 2024-09-07 DIAGNOSIS — J449 Chronic obstructive pulmonary disease, unspecified: Secondary | ICD-10-CM

## 2024-09-08 ENCOUNTER — Encounter (INDEPENDENT_AMBULATORY_CARE_PROVIDER_SITE_OTHER): Payer: Self-pay | Admitting: Vascular Surgery

## 2024-09-08 ENCOUNTER — Ambulatory Visit (INDEPENDENT_AMBULATORY_CARE_PROVIDER_SITE_OTHER): Admitting: Vascular Surgery

## 2024-09-08 VITALS — BP 117/75 | HR 107 | Resp 16 | Wt 156.8 lb

## 2024-09-08 DIAGNOSIS — I70213 Atherosclerosis of native arteries of extremities with intermittent claudication, bilateral legs: Secondary | ICD-10-CM | POA: Diagnosis not present

## 2024-09-08 DIAGNOSIS — E782 Mixed hyperlipidemia: Secondary | ICD-10-CM | POA: Diagnosis not present

## 2024-09-08 DIAGNOSIS — Z720 Tobacco use: Secondary | ICD-10-CM

## 2024-09-08 DIAGNOSIS — I70219 Atherosclerosis of native arteries of extremities with intermittent claudication, unspecified extremity: Secondary | ICD-10-CM | POA: Insufficient documentation

## 2024-09-08 DIAGNOSIS — I10 Essential (primary) hypertension: Secondary | ICD-10-CM | POA: Diagnosis not present

## 2024-09-08 MED ORDER — ASPIRIN 81 MG PO TBEC: 81.0000 mg | DELAYED_RELEASE_TABLET | Freq: Every day | ORAL | 2 refills | Status: AC

## 2024-09-08 NOTE — Progress Notes (Signed)
 MRN : [id number]  Krystal Lee is a 68 y.o. (April 25, 1956) female who presents with chief complaint of  Chief Complaint  Patient presents with   Follow-up    [facility] 2 week follow up  .  History of Present Illness: Patient returns today in follow up of aortoiliac occlusion to discuss ongoing treatments.  She had an angiogram as well as a CT scan earlier this month.  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  There is reconstitution through internal iliac artery collaterals to external iliac arteries.  She has fairly normal flow from the femoral artery down.  She is bothered by significant claudication symptoms as well as numbness and some pain in her feet.  She does continue to smoke.  She does not have any open wounds or infection.  No fever or chills.  She had no periprocedural complications and her access sites are well-healed.  Current Outpatient Medications  Medication Sig Dispense Refill   albuterol  (PROVENTIL ) (2.5 MG/3ML) 0.083% nebulizer solution Take 2.5 mg by nebulization every 6 (six) hours as needed for wheezing or shortness of breath.     ALPRAZolam  (XANAX ) 0.5 MG tablet Take 0.5 mg by mouth 3 (three) times daily as needed for anxiety.     aspirin EC 81 MG tablet Take 1 tablet (81 mg total) by mouth daily. 150 tablet 2   cholecalciferol  (VITAMIN D3) 25 MCG (1000 UNIT) tablet Take 1,000 Units by mouth daily.     Fluticasone -Umeclidin-Vilant (TRELEGY ELLIPTA) 200-62.5-25 MCG/ACT AEPB Inhale into the lungs.     furosemide  (LASIX ) 20 MG tablet Take 1 tablet (20 mg total) by mouth daily. 30 tablet    ibuprofen  (ADVIL ) 600 MG tablet Take 600 mg by mouth 2 (two) times daily as needed.     levothyroxine (SYNTHROID) 50 MCG tablet Take 50 mcg by mouth daily.     montelukast  (SINGULAIR ) 10 MG tablet Take 10 mg by mouth at bedtime.     pantoprazole  (PROTONIX ) 40 MG tablet Take 1 tablet (40 mg total) by mouth daily after breakfast. 30 tablet 3    predniSONE  (DELTASONE ) 5 MG tablet Take 1 tablet (5 mg total) by mouth daily with breakfast. 30 tablet 3   QUEtiapine  Fumarate (SEROQUEL  XR) 150 MG 24 hr tablet Take 150 mg by mouth at bedtime.     rosuvastatin  (CRESTOR ) 40 MG tablet Take 40 mg by mouth at bedtime.     valsartan  (DIOVAN ) 80 MG tablet Take 1 tablet (80 mg total) by mouth daily. 30 tablet 0   No current facility-administered medications for this visit.    Past Medical History:  Diagnosis Date   Abnormal CT of the chest 07/25/2023   Acute on chronic respiratory failure with hypoxia and hypercapnia (HCC) 09/16/2017   Anxiety    Arthritis    knees   Bipolar 1 disorder (HCC)    COPD (chronic obstructive pulmonary disease) (HCC)    Depression    GERD (gastroesophageal reflux disease)    Headache    during allergy season   Hepatitis C virus    HLD (hyperlipidemia) 07/05/2023   Hypertension    Nausea and vomiting 05/12/2024   Nocturnal hypoxemia due to emphysema (HCC) 12/12/2023   Post herpetic neuralgia 12/12/2023   Post-menopausal    Schizoaffective disorder (HCC)    Schizophrenia (HCC) 09/16/2022   Sepsis (HCC)    Thrombocytopenia    Dr. Babara oncologist    Past Surgical History:  Procedure  Laterality Date   CATARACT EXTRACTION  2010   right    CATARACT EXTRACTION W/PHACO Left 06/09/2019   Procedure: CATARACT EXTRACTION PHACO AND INTRAOCULAR LENS PLACEMENT (IOC) COMPLICATED  LEFT;  Surgeon: Jaye Fallow, MD;  Location: [facility];  Service: Ophthalmology;  Laterality: Left;  VISION BLUE  [place]    COLONOSCOPY N/A 05/12/2024   Procedure: COLONOSCOPY;  Surgeon: Therisa Bi, MD;  Location: [facility];  Service: Gastroenterology;  Laterality: N/A;   ENDOBRONCHIAL ULTRASOUND Right 01/14/2024   Procedure: ENDOBRONCHIAL ULTRASOUND;  Surgeon: Tamea Dedra CROME, MD;  Location: [facility] ORS;  Service: Pulmonary;  Laterality: Right;   ESOPHAGOGASTRODUODENOSCOPY N/A 05/12/2024   Procedure: EGD  (ESOPHAGOGASTRODUODENOSCOPY);  Surgeon: Therisa Bi, MD;  Location: [facility] ENDOSCOPY;  Service: Gastroenterology;  Laterality: N/A;   HEMOSTASIS CLIP PLACEMENT  05/12/2024   Procedure: CONTROL OF HEMORRHAGE, GI TRACT, ENDOSCOPIC, BY CLIPPING OR OVERSEWING;  Surgeon: Therisa Bi, MD;  Location: [facility] ENDOSCOPY;  Service: Gastroenterology;;   LOWER EXTREMITY ANGIOGRAPHY Bilateral 08/31/2013   [facility], Dr. Marea   LOWER EXTREMITY ANGIOGRAPHY Right 08/24/2024   Procedure: Lower Extremity Angiography;  Surgeon: Marea Selinda RAMAN, MD;  Location: [facility] INVASIVE CV LAB;  Service: Cardiovascular;  Laterality: Right;   POLYPECTOMY  05/12/2024   Procedure: POLYPECTOMY, INTESTINE;  Surgeon: Therisa Bi, MD;  Location: [facility] ENDOSCOPY;  Service: Gastroenterology;;     Social History   Tobacco Use   Smoking status: Every Day    Current packs/day: 0.00    Average packs/day: 1 pack/day for 45.0 years (45.0 ttl pk-yrs)    Types: Cigarettes    Start date: 05/01/1976    Last attempt to quit: 05/01/2021    Years since quitting: 3.3   Smokeless tobacco: Never   Tobacco comments:    Started smoking at 68 yrs old.    10 cigarettes a day  khj 01/02/2024  Vaping Use   Vaping status: Never Used  Substance Use Topics   Alcohol use: Not Currently    Alcohol/week: 1.0 standard drink of alcohol    Types: 1 Cans of beer per week   Drug use: No    Comment: prescribed xanax       Family History  Problem Relation Age of Onset   Breast cancer Paternal Grandmother    Bone cancer Paternal Grandmother    Pancreatic cancer Mother    Heart disease Father    Heart disease Paternal Uncle    Stroke Paternal Grandfather      No Known Allergies   REVIEW OF SYSTEMS (Negative unless checked)  Constitutional: [] Weight loss  [] Fever  [] Chills Cardiac: [] Chest pain   [] Chest pressure   [] Palpitations   [] Shortness of breath when laying flat   [] Shortness of breath at rest   [] Shortness of breath with exertion. Vascular:  [] Pain in  legs with walking   [] Pain in legs at rest   [] Pain in legs when laying flat   [] Claudication   [] Pain in feet when walking  [] Pain in feet at rest  [] Pain in feet when laying flat   [] History of DVT   [] Phlebitis   [] Swelling in legs   [] Varicose veins   [] Non-healing ulcers Pulmonary:   [] Uses home oxygen   [] Productive cough   [] Hemoptysis   [] Wheeze  [x] COPD   [] Asthma Neurologic:  [] Dizziness  [] Blackouts   [] Seizures   [] History of stroke   [] History of TIA  [] Aphasia   [] Temporary blindness   [] Dysphagia   [] Weakness or numbness in arms   [] Weakness or numbness  in legs Musculoskeletal:  [x] Arthritis   [] Joint swelling   [x] Joint pain   [] Low back pain Hematologic:  [] Easy bruising  [] Easy bleeding   [] Hypercoagulable state   [x] Anemic   Gastrointestinal:  [] Blood in stool   [] Vomiting blood  [x] Gastroesophageal reflux/heartburn   [] Abdominal pain Genitourinary:  [] Chronic kidney disease   [] Difficult urination  [] Frequent urination  [] Burning with urination   [] Hematuria Skin:  [] Rashes   [] Ulcers   [] Wounds Psychological:  [x] History of anxiety   []  History of major depression.  Physical Examination  BP 117/75   Pulse (!) 107   Resp 16   Wt 156 lb 12.8 oz (71.1 kg)   BMI 31.67 kg/m  Gen:  WD/WN, NAD Head: [place]/AT, No temporalis wasting. Ear/Nose/Throat: Hearing grossly intact, nares w/o erythema or drainage Eyes: Conjunctiva clear. Sclera non-icteric Neck: Supple.  Trachea midline Pulmonary:  Good air movement, no use of accessory muscles.  Cardiac: Tachycardic Vascular:  Vessel Right Left  Radial Palpable Palpable                          PT Not Palpable Not Palpable  DP Not Palpable Not Palpable   Gastrointestinal: soft, non-tender/non-distended. No guarding/reflex.  Musculoskeletal: M/S 5/5 throughout.  No deformity or atrophy. Trace LE edema. Neurologic: Sensation grossly intact in extremities.  Symmetrical.  Speech is fluent.  Psychiatric: Judgment intact, Mood &  affect appropriate for pt's clinical situation. Dermatologic: No rashes or ulcers noted.  No cellulitis or open wounds.      Labs Recent Results (from the past 2160 hours)  VAS US  ABI WITH/WO TBI     Status: None   Collection Time: 08/18/24  2:32 PM  Result Value Ref Range   Right ABI .50    Left ABI .58   BUN     Status: None   Collection Time: 08/24/24 10:37 AM  Result Value Ref Range   BUN 19 8 - 23 mg/dL    Comment: Performed at [facility], [address] [place], [place] [address]  Creatinine, serum     Status: None   Collection Time: 08/24/24 10:37 AM  Result Value Ref Range   Creatinine, Ser 0.76 0.44 - 1.00 mg/dL   GFR, Estimated >39 >39 mL/min    Comment: (NOTE) Calculated using the CKD-EPI Creatinine Equation (2021) Performed at [facility], [address] [place], [place] [address]     Radiology CT CHEST WO CONTRAST Result Date: 08/24/2024 CLINICAL DATA:  Follow-up lung lesion. Slight cough and shortness of breath. P.r.n. oxygen use. History of COPD. EXAM: CT CHEST WITHOUT CONTRAST TECHNIQUE: Multidetector CT imaging of the chest was performed following the standard protocol without IV contrast. RADIATION DOSE REDUCTION: This exam was performed according to the departmental dose-optimization program which includes automated exposure control, adjustment of the mA and/or kV according to patient size and/or use of iterative reconstruction technique. COMPARISON:  04/14/2024 FINDINGS: Cardiovascular: Normal heart size. Small pericardial effusion. No change since prior study. Calcification of the aorta and coronary arteries. No aneurysm. Mediastinum/Nodes: Thyroid gland is unremarkable. Esophagus is decompressed. Scattered lymph nodes in the mediastinum are not pathologically enlarged, likely reactive. Lungs/Pleura: Diffuse emphysematous changes throughout the lungs with scattered fibrosis mostly in the lung bases. Thick-walled cavitary lesion is again  demonstrated in the right upper lung. The lesion measures about 2.7 x 4.8 cm, similar to prior study. According to prior reports, this has previously been biopsied. Surrounding scar or spiculation  or present. No significant progression. In the left upper lung, there is a focal area of nodularity with scarring with the nodular component measuring about 8 mm diameter. This is also unchanged since the prior study. See series 3, image 33. Upper Abdomen: No acute abnormality. Musculoskeletal: No chest wall mass or suspicious bone lesions identified. Degenerative changes in the spine. Compression of L1 vertebra without change since prior studies. IMPRESSION: 1. Unchanged appearance of thick-walled spiculated cavitary lesion in the right upper lung at about 4.8 cm maximal diameter. 2. 8 mm nodularity with scarring in the left upper lung is also unchanged. 3. No new nodules identified. 4. Severe emphysematous changes in the lungs. 5. Aortic atherosclerosis. Electronically Signed   By: Elsie Gravely M.D.   On: 08/24/2024 15:50   CT Angio Abd/Pel w/ and/or w/o Result Date: 08/24/2024 CLINICAL DATA:  Aortic occlusion. History of peripheral arterial disease. Now symptoms worrisome for rest pain on both sides. EXAM: CTA ABDOMEN AND PELVIS WITHOUT AND WITH CONTRAST TECHNIQUE: Multidetector CT imaging of the abdomen and pelvis was performed using the standard protocol during bolus administration of intravenous contrast. Multiplanar reconstructed images and MIPs were obtained and reviewed to evaluate the vascular anatomy. RADIATION DOSE REDUCTION: This exam was performed according to the departmental dose-optimization program which includes automated exposure control, adjustment of the mA and/or kV according to patient size and/or use of iterative reconstruction technique. CONTRAST:  85mL OMNIPAQUE  IOHEXOL  350 MG/ML SOLN COMPARISON:  PET CT 12/19/2023 FINDINGS: VASCULAR Aorta: Normal caliber thoracic aorta with diffuse  calcific and noncalcific plaque formation. Multiple areas of ulcerated plaque formation. There is occlusion of the abdominal aorta below the origin of the inferior mesenteric artery and above the bifurcation. No flow is demonstrated to the bifurcation. Celiac: Patent without evidence of aneurysm, dissection, vasculitis or significant stenosis. SMA: Patent without evidence of aneurysm, dissection, vasculitis or significant stenosis. Renals: Both renal arteries are patent without evidence of aneurysm, dissection, vasculitis, fibromuscular dysplasia or significant stenosis. IMA: Patent without evidence of aneurysm, dissection, vasculitis or significant stenosis. Inflow: The origin of the right common iliac artery is occluded with no flow demonstrated to the level of the iliac bifurcation. Collateral flow is demonstrated to the internal and external iliac arteries on the right with flow demonstrated throughout. The origin of the left common iliac artery is occluded focally with a small amount of flow demonstrated in the remainder of the common iliac artery, representing about 70-80% diameter reduction throughout. The internal and external iliac arteries on the left are patent although diminutive in caliber. Proximal Outflow: Flow is demonstrated in the common femoral arteries and the visualized proximal superficial femoral and deep femoral arteries bilaterally. Veins: No obvious venous abnormality within the limitations of this arterial phase study. Review of the MIP images confirms the above findings. NON-VASCULAR Lower chest: Prominent facet minutes changes and scattered fibrosis in the lung bases. Hepatobiliary: No focal liver abnormality is seen. No gallstones, gallbladder wall thickening, or biliary dilatation. Pancreas: Unremarkable. No pancreatic ductal dilatation or surrounding inflammatory changes. Spleen: Normal in size without focal abnormality. Adrenals/Urinary Tract: Adrenal glands are unremarkable. Kidneys  are normal, without renal calculi, focal lesion, or hydronephrosis. Bladder is unremarkable. Stomach/Bowel: Stomach is within normal limits. Appendix appears normal. No evidence of bowel wall thickening, distention, or inflammatory changes. Colonic diverticula without evidence of acute diverticulitis. Lymphatic: No significant lymphadenopathy. Reproductive: Uterus and bilateral adnexa are unremarkable. Other: No abdominal wall hernia or abnormality. No abdominopelvic ascites. Musculoskeletal: Compression of the L1 vertebra  is unchanged since prior study. Degenerative changes in the spine. Curvilinear calcifications in the femoral heads bilaterally consistent with avascular necrosis. IMPRESSION: VASCULAR 1. 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 2. Occlusion of the right common iliac artery with reconstitution of flow to the internal and external iliac arteries. 3. Focal occlusion of the left common iliac artery with limited flow via diffusely diseased common iliac artery estimated at 80% diameter reduction. Left external iliac and internal iliac arteries are patent with decreased diameter. NON-VASCULAR 1. Emphysematous changes in the lung bases. 2. No acute process demonstrated in the abdomen or pelvis. No evidence of bowel obstruction or inflammation. No bowel wall thickening or pneumatosis. 3. 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 Electronically Signed   By: Elsie Gravely M.D.   On: 08/24/2024 15:27   PERIPHERAL VASCULAR CATHETERIZATION Result Date: 08/24/2024 See surgical note for result.  VAS US  ABI WITH/WO TBI Result Date: 08/21/2024  LOWER EXTREMITY DOPPLER STUDY Patient Name:  Krystal Lee  Date of Exam:   08/18/2024 Medical Rec #: [id number]          Accession #:    [id number] Date of Birth: 05-Apr-1956          Patient Gender: F Patient Age:   30 years Exam [facility]  Procedure:      VAS US  ABI WITH/WO TBI Referring Phys: SELINDA Oakes Mccready --------------------------------------------------------------------------------  Indications: Rest pain, and peripheral artery disease.  Vascular Interventions: Past Angioplasty. Performing Technologist: Leafy Gibes RVS  Examination Guidelines: A complete evaluation includes at minimum, Doppler waveform signals and systolic blood pressure reading at the level of bilateral brachial, anterior tibial, and posterior tibial arteries, when vessel segments are accessible. Bilateral testing is considered an integral part of a complete examination. Photoelectric Plethysmograph (PPG) waveforms and toe systolic pressure readings are included as required and additional duplex testing as needed. Limited examinations for reoccurring indications may be performed as noted.  ABI Findings: +---------+------------------+-----+----------+--------+ Right    Rt Pressure (mmHg)IndexWaveform  Comment  +---------+------------------+-----+----------+--------+ Brachial 139                                       +---------+------------------+-----+----------+--------+ ATA      69                0.50 monophasic         +---------+------------------+-----+----------+--------+ PTA      0                 0.00 absent             +---------+------------------+-----+----------+--------+ Great Toe45                0.32 Abnormal           +---------+------------------+-----+----------+--------+ +---------+------------------+-----+----------+-------+ Left     Lt Pressure (mmHg)IndexWaveform  Comment +---------+------------------+-----+----------+-------+ Brachial 139                                      +---------+------------------+-----+----------+-------+ ATA      64                0.46 monophasic        +---------+------------------+-----+----------+-------+ PTA      81  0.58 monophasic         +---------+------------------+-----+----------+-------+ Great Toe80                0.58 Abnormal          +---------+------------------+-----+----------+-------+ +-------+-----------+-----------+------------+------------+ ABI/TBIToday's ABIToday's TBIPrevious ABIPrevious TBI +-------+-----------+-----------+------------+------------+ Right  .50        .32                                 +-------+-----------+-----------+------------+------------+ Left   .58        .58                                 +-------+-----------+-----------+------------+------------+  Summary: Right: Resting right ankle-brachial index indicates moderate right lower extremity arterial disease. The right toe-brachial index is abnormal.  Imaged and obtained Waveforms at the level of the Ankle in the Right Lower Extremity. No flow seen in the Right Posterior Tibial Artery. Left: Resting left ankle-brachial index indicates moderate left lower extremity arterial disease. The left toe-brachial index is abnormal.  *See table(s) above for measurements and observations.  Electronically signed by Selinda Gu MD on 08/21/2024 at 9:39:44 AM.    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  She has fairly significant claudication symptoms and may have some degree of rest pain with numbness and tingling in her feet.  There may also be a component of neuropathy.  At this point, we discussed 2 options for proceeding.  1 would be surgical therapy which would be an aortobiiliac bypass or aortobifemoral bypass.  This carries with it and expected 2 to 3% risk of life-threatening complications, 7 to 10-day hospital stay, and about a 2 to 65-month recovery.  The other option would be increasing her exercise to try to improve her collateral blood flow which is fairly robust already.  She is on Crestor  but no anticoagulants and I would  recommend adding at least an aspirin a day.  If she develops any wounds or ulceration, she really would have no other option than bypass surgery for limb salvage.  At current, she does not have any immediate limb threatening symptoms although there may be a component of rest pain.  She thinks she can tolerate it and does not want to have surgery at this time and her sister who is with her today is in agreement with this.  I will plan to see her back in a few months with ABIs and have recommended she stop smoking and walk is much as possible to build up collateral blood flow.  Hypertension blood pressure control important in reducing the progression of atherosclerotic disease. On appropriate oral medications.   Tobacco abuse Smoking cessation would be of benefit not only for her current lower extremity perfusion, but building up collaterals and walking distances.  Many agents are available including Chantix.  HLD (hyperlipidemia) lipid control important in reducing the progression of atherosclerotic disease. Continue statin therapy     Selinda Gu, MD  09/08/2024 4:53 PM    This note was created with Dragon medical transcription system.  Any errors from dictation are purely unintentional

## 2024-09-08 NOTE — Assessment & Plan Note (Signed)
 Smoking cessation would be of benefit not only for her current lower extremity perfusion, but building up collaterals and walking distances.  Many agents are available including Chantix.

## 2024-09-08 NOTE — Assessment & Plan Note (Signed)
 blood pressure control important in reducing the progression of atherosclerotic disease. On appropriate oral medications.

## 2024-09-08 NOTE — Assessment & Plan Note (Signed)
 The patient has a known aortoiliac occlusion.  She has fairly significant claudication symptoms and may have some degree of rest pain with numbness and tingling in her feet.  There may also be a component of neuropathy.  At this point, we discussed 2 options for proceeding.  1 would be surgical therapy which would be an aortobiiliac bypass or aortobifemoral bypass.  This carries with it and expected 2 to 3% risk of life-threatening complications, 7 to 10-day hospital stay, and about a 2 to 67-month recovery.  The other option would be increasing her exercise to try to improve her collateral blood flow which is fairly robust already.  She is on Crestor  but no anticoagulants and I would recommend adding at least an aspirin a day.  If she develops any wounds or ulceration, she really would have no other option than bypass surgery for limb salvage.  At current, she does not have any immediate limb threatening symptoms although there may be a component of rest pain.  She thinks she can tolerate it and does not want to have surgery at this time and her sister who is with her today is in agreement with this.  I will plan to see her back in a few months with ABIs and have recommended she stop smoking and walk is much as possible to build up collateral blood flow.

## 2024-09-08 NOTE — Assessment & Plan Note (Signed)
 lipid control important in reducing the progression of atherosclerotic disease. Continue statin therapy

## 2024-09-09 ENCOUNTER — Telehealth (INDEPENDENT_AMBULATORY_CARE_PROVIDER_SITE_OTHER): Payer: Self-pay | Admitting: Vascular Surgery

## 2024-09-09 NOTE — Telephone Encounter (Signed)
 LVM for pt to CB to schedule at [facility]  fu 3 months + ABI see JD/FB

## 2024-09-28 NOTE — Progress Notes (Unsigned)
 Cardiology Office Note  Date:  09/29/2024   ID:  Krystal Lee, Krystal Lee [birth year], MRN [id number]  PCP:  Donal Channing SQUIBB, FNP   Chief Complaint  Patient presents with   12 month follow up    Patient c/o throbbing headache, ringing in ears, dizziness and shortness of breath most days.  Patient had 9 teeth extracted in August and has taken Ibuprofen  800 mg one tablet every 3 hours since then.     HPI:  Krystal Lee is a 68 year old woman with past medical history of Smoking COPD HTN bipolar Recent hospitalization for COPD exacerbation Who presents for f/u of her  hypertension, abnormal echocardiogram  LOV 4/24  Low BP today, orthostatics positive Supine 110/68 pulse 97 Sitting 92/59 heart rate 102, dizzy Standing 81/56 heart rate 108, lightheaded Standing 3 minutes 60/52 heart rate 115 heart, ringing in the ears  On valsartan  80 daily Has not been taking Lasix , only takes as needed for ankle swelling  Reports that she continues to smoke, has patches but has not decided when to start Does not want Chantix Compliant with her Crestor  daily  Recent imaging reviewed CT scan 1. 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 2. Occlusion of the right common iliac artery with reconstitution of flow to the internal and external iliac arteries. 3. Focal occlusion of the left common iliac artery with limited flow via diffusely diseased common iliac artery estimated at 80% diameter reduction. Left external iliac and internal iliac arteries are patent with decreased diameter.  No recent COPD exacerbation In hospital January 2020 for COPD exacerbation In hospital 05/29/22 copd exacerbation In hospital 05/18/2021: copd exacerbation In hospital 1/23: copd exacerbation COPD exacerbation April 2024  Has a neb machine, On inhaler trelegy Has oxygen at home, generator  Echo 05/20/2021 Normal  EF  55  to 60%  EKG personally reviewed by myself on todays visit EKG Interpretation Date/Time:  Tuesday September 29 2024 14:51:57 EST Ventricular Rate:  101 PR Interval:  134 QRS Duration:  74 QT Interval:  340 QTC Calculation: 440 R Axis:   34  Text Interpretation: Sinus tachycardia When compared with ECG of 23-Jan-2024 16:11, No significant change was found Confirmed by Perla Lye [phone]) on 09/29/2024 3:15:00 PM    PMH:   has a past medical history of Abnormal CT of the chest (07/25/2023), Acute on chronic respiratory failure with hypoxia and hypercapnia (HCC) (09/16/2017), Anxiety, Arthritis, Bipolar 1 disorder (HCC), COPD (chronic obstructive pulmonary disease) (HCC), Depression, GERD (gastroesophageal reflux disease), Headache, Hepatitis C virus, HLD (hyperlipidemia) (07/05/2023), Hypertension, Nausea and vomiting (05/12/2024), Nocturnal hypoxemia due to emphysema (HCC) (12/12/2023), Post herpetic neuralgia (12/12/2023), Post-menopausal, Schizoaffective disorder (HCC), Schizophrenia (HCC) (09/16/2022), Sepsis (HCC), and Thrombocytopenia.  PSH:    Past Surgical History:  Procedure Laterality Date   CATARACT EXTRACTION  2010   right    CATARACT EXTRACTION W/PHACO Left 06/09/2019   Procedure: CATARACT EXTRACTION PHACO AND INTRAOCULAR LENS PLACEMENT (IOC) COMPLICATED  LEFT;  Surgeon: Jaye Fallow, MD;  Location: [facility];  Service: Ophthalmology;  Laterality: Left;  VISION BLUE  [place]    COLONOSCOPY N/A 05/12/2024   Procedure: COLONOSCOPY;  Surgeon: Therisa Bi, MD;  Location: [facility] ENDOSCOPY;  Service: Gastroenterology;  Laterality: N/A;   ENDOBRONCHIAL ULTRASOUND Right 01/14/2024   Procedure: ENDOBRONCHIAL ULTRASOUND;  Surgeon: Tamea Dedra CROME, MD;  Location: [facility] ORS;  Service: Pulmonary;  Laterality: Right;   ESOPHAGOGASTRODUODENOSCOPY N/A 05/12/2024   Procedure: EGD (  ESOPHAGOGASTRODUODENOSCOPY);  Surgeon: Therisa Bi, MD;  Location: [facility] ENDOSCOPY;  Service:  Gastroenterology;  Laterality: N/A;   HEMOSTASIS CLIP PLACEMENT  05/12/2024   Procedure: CONTROL OF HEMORRHAGE, GI TRACT, ENDOSCOPIC, BY CLIPPING OR OVERSEWING;  Surgeon: Therisa Bi, MD;  Location: [facility] ENDOSCOPY;  Service: Gastroenterology;;   LOWER EXTREMITY ANGIOGRAPHY Bilateral 08/31/2013   [facility], Dr. Marea   LOWER EXTREMITY ANGIOGRAPHY Right 08/24/2024   Procedure: Lower Extremity Angiography;  Surgeon: Marea Selinda RAMAN, MD;  Location: [facility] INVASIVE CV LAB;  Service: Cardiovascular;  Laterality: Right;   POLYPECTOMY  05/12/2024   Procedure: POLYPECTOMY, INTESTINE;  Surgeon: Therisa Bi, MD;  Location: [facility] ENDOSCOPY;  Service: Gastroenterology;;    Current Outpatient Medications  Medication Sig Dispense Refill   albuterol  (PROVENTIL ) (2.5 MG/3ML) 0.083% nebulizer solution Take 2.5 mg by nebulization every 6 (six) hours as needed for wheezing or shortness of breath.     ALPRAZolam  (XANAX ) 0.5 MG tablet Take 0.5 mg by mouth 3 (three) times daily as needed for anxiety.     aspirin EC 81 MG tablet Take 1 tablet (81 mg total) by mouth daily. 150 tablet 2   cholecalciferol  (VITAMIN D3) 25 MCG (1000 UNIT) tablet Take 1,000 Units by mouth daily.     Fluticasone -Umeclidin-Vilant (TRELEGY ELLIPTA) 200-62.5-25 MCG/ACT AEPB Inhale into the lungs.     furosemide  (LASIX ) 20 MG tablet Take 1 tablet (20 mg total) by mouth daily. 30 tablet    ibuprofen  (ADVIL ) 600 MG tablet Take 600 mg by mouth 2 (two) times daily as needed.     levothyroxine (SYNTHROID) 50 MCG tablet Take 50 mcg by mouth daily.     montelukast  (SINGULAIR ) 10 MG tablet Take 10 mg by mouth at bedtime.     pantoprazole  (PROTONIX ) 40 MG tablet Take 1 tablet (40 mg total) by mouth daily after breakfast. 30 tablet 3   predniSONE  (DELTASONE ) 5 MG tablet Take 1 tablet (5 mg total) by mouth daily with breakfast. 30 tablet 3   QUEtiapine  Fumarate (SEROQUEL  XR) 150 MG 24 hr tablet Take 150 mg by mouth at bedtime.     rosuvastatin  (CRESTOR ) 40 MG tablet  Take 40 mg by mouth at bedtime.     valsartan  (DIOVAN ) 80 MG tablet Take 1 tablet (80 mg total) by mouth daily. 30 tablet 0   No current facility-administered medications for this visit.     Allergies:   Patient has no known allergies.   Social History:  The patient  reports that she has been smoking cigarettes. She started smoking about 48 years ago. She has a 45 pack-year smoking history. She has never used smokeless tobacco. She reports that she does not currently use alcohol after a past usage of about 1.0 standard drink of alcohol per week. She reports that she does not use drugs.   Family History:   family history includes Bone cancer in her paternal grandmother; Breast cancer in her paternal grandmother; Heart disease in her father and paternal uncle; Pancreatic cancer in her mother; Stroke in her paternal grandfather.    Review of Systems: Review of Systems  Constitutional: Negative.   HENT: Negative.    Respiratory: Negative.    Cardiovascular: Negative.   Gastrointestinal: Negative.   Musculoskeletal: Negative.   Neurological:  Positive for dizziness.  Psychiatric/Behavioral: Negative.    All other systems reviewed and are negative.   PHYSICAL EXAM: VS:  BP 90/60 (BP Location: Left Arm, Patient Position: Sitting, Cuff Size: Normal)   Pulse (!) 101   Ht 4'  11 (1.499 m)   Wt 160 lb 4 oz (72.7 kg)   SpO2 99%   BMI 32.37 kg/m  , BMI Body mass index is 32.37 kg/m. Constitutional:  oriented to person, place, and time. No distress.  HENT:  Head: Grossly normal Eyes:  no discharge. No scleral icterus.  Neck: No JVD, no carotid bruits  Cardiovascular: Regular rate and rhythm, no murmurs appreciated Pulmonary/Chest: Clear to auscultation bilaterally, no wheezes or rails Abdominal: Soft.  no distension.  no tenderness.  Musculoskeletal: Normal range of motion Neurological:  normal muscle tone. Coordination normal. No atrophy Skin: Skin warm and dry Psychiatric: normal  affect, pleasant  Recent Labs: 01/23/2024: Hemoglobin 12.7; Platelets 267; Potassium 3.9; Sodium 140 08/24/2024: BUN 19; Creatinine, Ser 0.76    Lipid Panel No results found for: CHOL, HDL, LDLCALC, TRIG    Wt Readings from Last 3 Encounters:  09/29/24 160 lb 4 oz (72.7 kg)  09/08/24 156 lb 12.8 oz (71.1 kg)  09/01/24 159 lb (72.1 kg)     ASSESSMENT AND PLAN:  Problem List Items Addressed This Visit       Cardiology Problems   Hypertension   Relevant Orders   EKG 12-Lead (Completed)   HLD (hyperlipidemia)     Other   Dyspnea   Relevant Orders   EKG 12-Lead (Completed)   Atypical chest pain   Other Visit Diagnoses       Atherosclerosis of artery of extremity with rest pain (HCC)    -  Primary   Relevant Orders   EKG 12-Lead (Completed)     Centrilobular emphysema (HCC)          Essential hypertension Symptomatic hypotension today, recommend she stop her valsartan , extra oral fluids in the office today Recommend close monitoring of blood pressure at home especially with standing She is not taking Lasix , only sparingly for ankle swelling  Diastolic CHF Ejection fraction 55 to 60% Minimal leg edema, appears euvolemic Blood pressure low, holding valsartan  and Lasix   Smoker/COPD Frequent hospitalizations for COPD exacerbation Discussed strategies for smoking cessation  Hyperlipidemia Continue Crestor  Managed by primary care   Signed, Velinda Lunger, M.D., Ph.D. [facility] [place], [place] [phone]

## 2024-09-29 ENCOUNTER — Encounter: Payer: Self-pay | Admitting: Cardiovascular Disease

## 2024-09-29 ENCOUNTER — Ambulatory Visit: Attending: Cardiovascular Disease | Admitting: Cardiovascular Disease

## 2024-09-29 VITALS — BP 90/60 | HR 101 | Ht 59.0 in | Wt 160.2 lb

## 2024-09-29 DIAGNOSIS — I70229 Atherosclerosis of native arteries of extremities with rest pain, unspecified extremity: Secondary | ICD-10-CM

## 2024-09-29 DIAGNOSIS — E782 Mixed hyperlipidemia: Secondary | ICD-10-CM

## 2024-09-29 DIAGNOSIS — R0600 Dyspnea, unspecified: Secondary | ICD-10-CM

## 2024-09-29 DIAGNOSIS — J432 Centrilobular emphysema: Secondary | ICD-10-CM | POA: Diagnosis not present

## 2024-09-29 DIAGNOSIS — I10 Essential (primary) hypertension: Secondary | ICD-10-CM | POA: Diagnosis not present

## 2024-09-29 DIAGNOSIS — R0789 Other chest pain: Secondary | ICD-10-CM

## 2024-09-29 NOTE — Addendum Note (Signed)
 Addended by: CRISTOPHER OLIVIA PARAS on: 09/29/2024 03:33 PM   Modules accepted: Orders

## 2024-09-29 NOTE — Patient Instructions (Addendum)
 Medication Instructions:   Hold the valsartan  , blood pressure is low  Lasix  sparingly as needed for leg swelling  If you need a refill on your cardiac medications before your next appointment, please call your pharmacy.   Lab work: No new labs needed  Testing/Procedures: No new testing needed  Follow-Up: At [facility], you and your health needs are our priority.  As part of our continuing mission to provide you with exceptional heart care, we have created designated Provider Care Teams.  These Care Teams include your primary Cardiologist (physician) and Advanced Practice Providers (APPs -  Physician Assistants and Nurse Practitioners) who all work together to provide you with the care you need, when you need it.  You will need a follow up appointment in 12 months  Providers on your designated Care Team:   Lonni Meager, NP Bernardino Bring, PA-C Cadence [place], [place]  COVID-19 Vaccine Information can be found at: [email or web address] For questions related to vaccine distribution or appointments, please email [email or web address] or call 3018214883.

## 2024-10-19 ENCOUNTER — Emergency Department

## 2024-10-19 ENCOUNTER — Emergency Department
Admission: EM | Admit: 2024-10-19 | Discharge: 2024-10-19 | Disposition: A | Discharge status: Home / Self Care | Attending: Emergency Medicine | Admitting: Emergency Medicine

## 2024-10-19 DIAGNOSIS — J209 Acute bronchitis, unspecified: Secondary | ICD-10-CM | POA: Insufficient documentation

## 2024-10-19 DIAGNOSIS — I10 Essential (primary) hypertension: Secondary | ICD-10-CM | POA: Diagnosis not present

## 2024-10-19 DIAGNOSIS — J449 Chronic obstructive pulmonary disease, unspecified: Secondary | ICD-10-CM | POA: Diagnosis not present

## 2024-10-19 DIAGNOSIS — R0602 Shortness of breath: Secondary | ICD-10-CM | POA: Diagnosis present

## 2024-10-19 LAB — BASIC METABOLIC PANEL WITH GFR
Anion gap: 12 (ref 5–15)
BUN: 18 mg/dL (ref 8–23)
CO2: 22 mmol/L (ref 22–32)
Calcium: 9.2 mg/dL (ref 8.9–10.3)
Chloride: 108 mmol/L (ref 98–111)
Creatinine, Ser: 0.72 mg/dL (ref 0.44–1.00)
GFR, Estimated: >60 mL/min (ref >60)
Glucose, Bld: 96 mg/dL (ref 70–99)
Potassium: 3.1 mmol/L — ABNORMAL LOW (ref 3.5–5.1)
Sodium: 142 mmol/L (ref 135–145)

## 2024-10-19 LAB — CBC
HCT: 35.5 % — ABNORMAL LOW (ref 36.0–46.0)
Hemoglobin: 11.8 g/dL — ABNORMAL LOW (ref 12.0–15.0)
MCH: 30.6 pg (ref 26.0–34.0)
MCHC: 33.2 g/dL (ref 30.0–36.0)
MCV: 92 fL (ref 80.0–100.0)
Platelets: 244 K/uL (ref 150–400)
RBC: 3.86 MIL/uL — ABNORMAL LOW (ref 3.87–5.11)
RDW: 14.5 % (ref 11.5–15.5)
WBC: 10.4 K/uL (ref 4.0–10.5)
nRBC: 0 % (ref 0.0–0.2)

## 2024-10-19 LAB — TROPONIN T, HIGH SENSITIVITY: Troponin T High Sensitivity: 15 ng/L (ref 0–19)

## 2024-10-19 MED ORDER — PREDNISONE 20 MG PO TABS
60.0000 mg | ORAL_TABLET | Freq: Once | ORAL | Status: AC
  Administered 2024-10-19: 60 mg via ORAL
  Filled 2024-10-19: qty 3

## 2024-10-19 MED ORDER — DOXYCYCLINE HYCLATE 100 MG PO TABS
100.0000 mg | ORAL_TABLET | Freq: Once | ORAL | Status: AC
  Administered 2024-10-19: 100 mg via ORAL
  Filled 2024-10-19: qty 1

## 2024-10-19 MED ORDER — DOXYCYCLINE HYCLATE 100 MG PO TABS: 100.0000 mg | ORAL_TABLET | Freq: Two times a day (BID) | ORAL | 0 refills | Status: DC

## 2024-10-19 MED ORDER — DOXYCYCLINE HYCLATE 100 MG PO TABS: 100.0000 mg | ORAL_TABLET | Freq: Two times a day (BID) | ORAL | 0 refills | Status: AC

## 2024-10-19 MED ORDER — PREDNISONE 20 MG PO TABS: 40.0000 mg | ORAL_TABLET | Freq: Every day | ORAL | 0 refills | Status: AC

## 2024-10-19 MED ORDER — IPRATROPIUM-ALBUTEROL 0.5-2.5 (3) MG/3ML IN SOLN
3.0000 mL | Freq: Once | RESPIRATORY_TRACT | Status: AC
  Administered 2024-10-19: 3 mL via RESPIRATORY_TRACT
  Filled 2024-10-19: qty 3

## 2024-10-19 NOTE — ED Provider Notes (Signed)
 [facility] Provider Note    Event Date/Time   First MD Initiated Contact with Patient 10/19/24 1849     (approximate)  History   Chief Complaint: Shortness of Breath  HPI  Krystal Lee is a 68 y.o. female with a past medical history of anxiety, bipolar, COPD, gastric reflux, hypertension, hyperlipidemia, schizophrenia, presents to the emergency department for shortness of breath.  According to the patient over the last 5 days she has been experiencing shortness of breath and a dry cough.  Patient states in the past she has ignored this and has developed into pneumonia so she came to the emergency department for evaluation.  Patient denies any fever.  States some slight chest discomfort at times.  No pleuritic pain.  Physical Exam   Triage Vital Signs: ED Triage Vitals  Encounter Vitals Group     BP 10/19/24 1434 (!) 133/91     Girls Systolic BP Percentile --      Girls Diastolic BP Percentile --      Boys Systolic BP Percentile --      Boys Diastolic BP Percentile --      Pulse Rate 10/19/24 1434 94     Resp 10/19/24 1434 18     Temp 10/19/24 1434 98.5 F (36.9 C)     Temp Source 10/19/24 1434 Oral     SpO2 10/19/24 1434 100 %     Weight 10/19/24 1435 159 lb (72.1 kg)     Height 10/19/24 1435 4' 11 (1.499 m)     Head Circumference --      Peak Flow --      Pain Score 10/19/24 1435 10     Pain Loc --      Pain Education --      Exclude from Growth Chart --     Most recent vital signs: Vitals:   10/19/24 1434  BP: (!) 133/91  Pulse: 94  Resp: 18  Temp: 98.5 F (36.9 C)  SpO2: 100%    General: Awake, no distress.  CV:  Good peripheral perfusion.  Regular rate and rhythm  Resp:  Normal effort.  Equal breath sounds bilaterally.  Very minimal expiratory wheeze bilaterally. Abd:  No distention.  Soft, nontender.  No rebound or guarding.  ED Results / Procedures / Treatments   EKG  EKG viewed and interpreted by myself shows a  normal sinus rhythm 88 bpm with a narrow QRS, normal axis, normal intervals, no concerning ST changes.  RADIOLOGY  I have reviewed interpret the chest x-ray images.  Possible right upper lobe pneumonia Radiology is read no acute process however there is unchanged right upper lobe spiculated lesion.  Patient states she is aware of this, has had this treated and biopsied previously.   MEDICATIONS ORDERED IN ED: Medications  ipratropium-albuterol  (DUONEB) 0.5-2.5 (3) MG/3ML nebulizer solution 3 mL (3 mLs Nebulization Given 10/19/24 1912)  ipratropium-albuterol  (DUONEB) 0.5-2.5 (3) MG/3ML nebulizer solution 3 mL (3 mLs Nebulization Given 10/19/24 1912)  predniSONE  (DELTASONE ) tablet 60 mg (60 mg Oral Given 10/19/24 1913)  doxycycline  (VIBRA -TABS) tablet 100 mg (100 mg Oral Given 10/19/24 1913)     IMPRESSION / MDM / ASSESSMENT AND PLAN / ED COURSE  I reviewed the triage vital signs and the nursing notes.  Patient's presentation is most consistent with acute presentation with potential threat to life or bodily function.  Patient presents emergency department for dry cough and chest discomfort.  Mild shortness of breath.  Symptoms are  very suggestive of acute bronchitis.  Patient satting 100% on room air with a normal respiratory rate.  Vital signs reassuring lab work reassuring with a normal CBC reassuring chemistry negative troponin.  Chest x-ray shows no acute process.  Patient received 2 DuoNebs, states she is feeling much better.  Will discharge on prednisone  and doxycycline  and have the patient follow-up with her doctor.  Patient takes 5 mg daily prednisone  we will increase to 40 mg for 5 days.  Patient agreeable to plan of care workup.  FINAL CLINICAL IMPRESSION(S) / ED DIAGNOSES   Acute bronchitis  Rx / DC Orders   Doxycycline  Prednisone   Note:  This document was prepared using Dragon voice recognition software and may include unintentional dictation errors.   Dorothyann Drivers,  MD 10/19/24 1942

## 2024-10-19 NOTE — ED Notes (Signed)
 Called lab and spoke with Brittany and informed her that we needed lab to come draw pts 2nd trop due to pt being a hard stick.

## 2024-10-19 NOTE — ED Notes (Addendum)
 Pt to ED via [facility] for shortness of breath. Pt has hx/o COPD. Pt sent over for evaluation for possible blood clot due to having a procedure on her Aortic valve in October.

## 2024-10-19 NOTE — ED Triage Notes (Signed)
 Pt presents to the ED via POV from [facility] clinic. Pt reports non-productive cough, SHOB, and stabbing chest pain. Pt has a hx of COPD. Pt A&Ox4. Pt reports that she has a build up of plaque in her aortic artery.

## 2024-11-28 ENCOUNTER — Other Ambulatory Visit: Payer: Self-pay

## 2024-11-28 ENCOUNTER — Encounter: Payer: Self-pay | Admitting: Emergency Medicine

## 2024-11-28 DIAGNOSIS — R059 Cough, unspecified: Secondary | ICD-10-CM | POA: Diagnosis present

## 2024-11-28 DIAGNOSIS — R052 Subacute cough: Secondary | ICD-10-CM | POA: Diagnosis not present

## 2024-11-28 DIAGNOSIS — R0981 Nasal congestion: Secondary | ICD-10-CM | POA: Insufficient documentation

## 2024-11-28 DIAGNOSIS — F17200 Nicotine dependence, unspecified, uncomplicated: Secondary | ICD-10-CM | POA: Diagnosis not present

## 2024-11-28 DIAGNOSIS — J449 Chronic obstructive pulmonary disease, unspecified: Secondary | ICD-10-CM | POA: Diagnosis not present

## 2024-11-28 DIAGNOSIS — H9313 Tinnitus, bilateral: Secondary | ICD-10-CM | POA: Diagnosis not present

## 2024-11-28 DIAGNOSIS — R0602 Shortness of breath: Secondary | ICD-10-CM | POA: Diagnosis not present

## 2024-11-28 NOTE — ED Triage Notes (Addendum)
 Pt in with sob and cough, worsened x a few days. Pt with hx of CHF and COPD, states she had bronchitis a month ago, finished course of Doxycycline . No missed doses of Lasix , reports nasal congestion

## 2024-11-29 ENCOUNTER — Emergency Department

## 2024-11-29 ENCOUNTER — Emergency Department
Admission: EM | Admit: 2024-11-29 | Discharge: 2024-11-29 | Disposition: A | Attending: Emergency Medicine | Admitting: Emergency Medicine

## 2024-11-29 DIAGNOSIS — H9313 Tinnitus, bilateral: Secondary | ICD-10-CM

## 2024-11-29 DIAGNOSIS — R0981 Nasal congestion: Secondary | ICD-10-CM

## 2024-11-29 DIAGNOSIS — R052 Subacute cough: Secondary | ICD-10-CM

## 2024-11-29 LAB — BASIC METABOLIC PANEL WITH GFR
Anion gap: 13 (ref 5–15)
BUN: 15 mg/dL (ref 8–23)
CO2: 19 mmol/L — ABNORMAL LOW (ref 22–32)
Calcium: 10.3 mg/dL (ref 8.9–10.3)
Chloride: 104 mmol/L (ref 98–111)
Creatinine, Ser: 0.77 mg/dL (ref 0.44–1.00)
GFR, Estimated: >60 mL/min
Glucose, Bld: 104 mg/dL — ABNORMAL HIGH (ref 70–99)
Potassium: 4.7 mmol/L (ref 3.5–5.1)
Sodium: 135 mmol/L (ref 135–145)

## 2024-11-29 LAB — CBC
HCT: 37 % (ref 36.0–46.0)
Hemoglobin: 12.2 g/dL (ref 12.0–15.0)
MCH: 30.9 pg (ref 26.0–34.0)
MCHC: 33 g/dL (ref 30.0–36.0)
MCV: 93.7 fL (ref 80.0–100.0)
Platelets: 191 K/uL (ref 150–400)
RBC: 3.95 MIL/uL (ref 3.87–5.11)
RDW: 13.6 % (ref 11.5–15.5)
WBC: 10.5 K/uL (ref 4.0–10.5)
nRBC: 0 % (ref 0.0–0.2)

## 2024-11-29 LAB — RESP PANEL BY RT-PCR (RSV, FLU A&B, COVID)  RVPGX2
Influenza A by PCR: NEGATIVE
Influenza B by PCR: NEGATIVE
Resp Syncytial Virus by PCR: NEGATIVE
SARS Coronavirus 2 by RT PCR: NEGATIVE

## 2024-11-29 LAB — PRO BRAIN NATRIURETIC PEPTIDE: Pro Brain Natriuretic Peptide: 85.3 pg/mL

## 2024-11-29 LAB — LACTIC ACID, PLASMA: Lactic Acid, Venous: 1.6 mmol/L (ref 0.5–1.9)

## 2024-11-29 LAB — LIPASE, BLOOD: Lipase: 29 U/L (ref 11–51)

## 2024-11-29 MED ORDER — PREDNISONE 20 MG PO TABS: 40.0000 mg | ORAL_TABLET | Freq: Every day | ORAL | 0 refills | Status: AC

## 2024-11-29 MED ORDER — PREDNISONE 20 MG PO TABS: 40.0000 mg | ORAL_TABLET | ORAL | Status: DC

## 2024-11-29 MED ORDER — FLUTICASONE PROPIONATE 50 MCG/ACT NA SUSP: 1.0000 | Freq: Every day | NASAL | 2 refills | Status: AC

## 2024-11-29 MED ORDER — CETIRIZINE HCL 10 MG PO TABS: 10.0000 mg | ORAL_TABLET | Freq: Every day | ORAL | 0 refills | Status: AC

## 2024-11-29 NOTE — ED Provider Notes (Signed)
 "  [facility] Provider Note    Event Date/Time   First MD Initiated Contact with Patient 11/29/24 0123     (approximate)   History   Shortness of Breath and Cough   HPI Krystal Lee is a 69 y.o. female  who presents with several concerns.  Primarily she is frustrated because she has ringing in both ears that has been present for months and months, and it frustrates her a great deal.  She has also had a cough and some increased shortness of breath.  She has COPD and continues to smoke which says she smokes less than usual.  She was treated for bronchitis few months ago and has a lesion in her lungs from years ago that has been biopsied but they think it was from pneumonia. She has not been running a fever, but she said that her head feels congested and full.  She says that mostly it is the ringing in her ears that is driving her crazy.  She saw Dr. Milissa with ENT in the past and even had an MRI, but she says they could not figure out what was wrong.      Physical Exam   Triage Vital Signs: ED Triage Vitals [11/28/24 2349]  Encounter Vitals Group     BP (!) 125/99     Girls Systolic BP Percentile      Girls Diastolic BP Percentile      Boys Systolic BP Percentile      Boys Diastolic BP Percentile      Pulse Rate (!) 122     Resp (!) 22     Temp 97.8 F (36.6 C)     Temp Source Oral     SpO2 98 %     Weight 70.3 kg (155 lb)     Height 1.499 m (4' 11)     Head Circumference      Peak Flow      Pain Score 10     Pain Loc      Pain Education      Exclude from Growth Chart     Most recent vital signs: Vitals:   11/29/24 0200 11/29/24 0300  BP: 100/70 108/67  Pulse: 89 85  Resp:  19  Temp:    SpO2: 98% 98%    General: Awake, alert, no obvious distress. CV:  Good peripheral perfusion. Regular rate and rhythm, normal heart sounds. Resp:  Normal effort. Speaking easily and comfortably, no accessory muscle usage nor intercostal retractions.   No coughing observed during our conversation.  Lungs CTAB.  No wheezing. Abd:  No distention.  Other:  Patient has some nasal congestion.  Bilateral ears are clean and clear, no erythema, normal appearing TMs without effusion.   ED Results / Procedures / Treatments   Labs (all labs ordered are listed, but only abnormal results are displayed) Labs Reviewed  BASIC METABOLIC PANEL WITH GFR - Abnormal; Notable for the following components:      Result Value   CO2 19 (*)    Glucose, Bld 104 (*)    All other components within normal limits  RESP PANEL BY RT-PCR (RSV, FLU A&B, COVID)  RVPGX2  CBC  LACTIC ACID, PLASMA  PRO BRAIN NATRIURETIC PEPTIDE  LIPASE, BLOOD     EKG  ED ECG REPORT I, Darleene Dome, the attending physician, personally viewed and interpreted this ECG.  Date: 11/29/2024 EKG Time: 00: 00 Rate: 113 Rhythm: Sinus tachycardia QRS Axis: normal  Intervals: normal ST/T Wave abnormalities: Non-specific ST segment / T-wave changes, but no clear evidence of acute ischemia. Narrative Interpretation: no definitive evidence of acute ischemia; does not meet STEMI criteria.    RADIOLOGY I independently viewed interpret the patient's two-view chest x-ray.  She has a lesion in her right upper lobe which is abnormal but which radiologist identified is a chronic cavitary lesion for which she has had prior biopsies.  Unchanged.  No evidence of new lobar pneumonia.   PROCEDURES:  Critical Care performed: No  Procedures    IMPRESSION / MDM / ASSESSMENT AND PLAN / ED COURSE  I reviewed the triage vital signs and the nursing notes.                              Differential diagnosis includes, but is not limited to, viral illness, bronchitis, COPD exacerbation, pneumonia  Patient's presentation is most consistent with acute presentation with potential threat to life or bodily function.  Labs/studies ordered (see ED course for additional labs and studies that may have been  added later): Two-view chest x-ray, EKG, CBC, respiratory viral panel, lactic acid, lipase, proBNP, BMP  Interventions/Medications given:  Medications  predniSONE  (DELTASONE ) tablet 40 mg (has no administration in time range)    (Note:  hospital course my include additional interventions and/or labs/studies not listed above.)   Reassuring workup with normal vital signs and essentially normal labs throughout other than very slightly decreased CO2.  No acute changes on chest x-ray nor on EKG, no evidence of heart failure, no evidence of acute pneumonia.  Patient is very frustrated about the tenderness but I provided reassurance.  Since she feels like she has worse head congestion that she did previously, I wrote prescriptions for fluticasone , cetirizine , and prednisone  in the hopes that that would help her feel better.  I encouraged her to follow-up as an outpatient with her primary care provider as well as possibly with ENT.  The patient's medical screening exam is reassuring with no indication of an emergent medical condition requiring hospitalization or additional evaluation at this point.  The patient is safe and appropriate for discharge and outpatient follow up.  Of note, the patient is not happy with this result despite my reassurance that there is no emergent condition.  She told her nurse that she was going to sue if we sent her home, but I already explained that there is no indication that she has an emergent condition that would qualify for hospitalization.  She asked me if her white blood cell count was elevated and explained that it was not.  She asked well how about you give me some Levaquin , that always works for me.  I explained to her that there is no evidence she has an infection for which Levaquin  is an appropriate treatment, and I do not want to give her medication she does not need and risk the side effects associated with the medication just so she feels like I am doing something  specific for her.  I tried multiple times to provide reassurance.      FINAL CLINICAL IMPRESSION(S) / ED DIAGNOSES   Final diagnoses:  Tinnitus of both ears  Head congestion  Subacute cough     Rx / DC Orders   ED Discharge Orders          Ordered    predniSONE  (DELTASONE ) 20 MG tablet  Daily with breakfast  11/29/24 0328    fluticasone  (FLONASE ) 50 MCG/ACT nasal spray  Daily        11/29/24 0328    cetirizine  (ZYRTEC  ALLERGY) 10 MG tablet  Daily        11/29/24 0328             Note:  This document was prepared using Dragon voice recognition software and may include unintentional dictation errors.   Gordan Huxley, MD 11/29/24 [phone]  "

## 2024-11-29 NOTE — Discharge Instructions (Addendum)
 You have been seen in the Emergency Department (ED) today for a likely viral illness.  Please drink plenty of clear fluids (water, Gatorade, chicken broth, etc).  You may use Tylenol  and/or Motrin  according to label instructions.  You can alternate between the two without any side effects.  Please take the prescribed medications as recommended and follow-up with your primary care provider.  Please follow up with your doctor as listed above.  Call your doctor or return to the Emergency Department (ED) if you are unable to tolerate fluids due to vomiting, have worsening trouble breathing, become extremely tired or difficult to awaken, or if you develop any other symptoms that concern you.

## 2024-11-30 ENCOUNTER — Telehealth: Payer: Self-pay

## 2024-11-30 DIAGNOSIS — R9389 Abnormal findings on diagnostic imaging of other specified body structures: Secondary | ICD-10-CM

## 2024-11-30 DIAGNOSIS — R911 Solitary pulmonary nodule: Secondary | ICD-10-CM

## 2024-11-30 NOTE — Telephone Encounter (Signed)
 LMTCB. E2C2 please advise when patient calls back.

## 2024-11-30 NOTE — Telephone Encounter (Signed)
 Copied from CRM #[id number]. Topic: Clinical - Request for Lab/Test Order >> Nov 27, 2024  3:44 PM Krystal Lee wrote: Reason for CRM: Patient is requesting Dr. Tamea to place an order for her routine CT at [facility] so that she may schedule it and then schedule her follow up with Dr. Tamea.

## 2024-11-30 NOTE — Telephone Encounter (Signed)
 See Results Follow up message from 10/7.  Per Dr. Evalyn response to the patient's CT scan done in Oct.- Areas previously biopsied and being followed are stable. There has been no change. Will continue to monitor. Recommend repeat CT in 4 months.    I have place the order for the CT.

## 2024-12-01 NOTE — Telephone Encounter (Signed)
 I have spoke with Mrs. Kothari and her CT has been scheduled on 12/21/24 at [facility] and her appt with Dr. Tamea has been scheduled on 01/11/25

## 2024-12-11 ENCOUNTER — Encounter (INDEPENDENT_AMBULATORY_CARE_PROVIDER_SITE_OTHER)

## 2024-12-11 ENCOUNTER — Ambulatory Visit (INDEPENDENT_AMBULATORY_CARE_PROVIDER_SITE_OTHER): Admitting: Vascular Surgery

## 2024-12-21 ENCOUNTER — Ambulatory Visit: Admission: RE | Admit: 2024-12-21 | Source: Ambulatory Visit

## 2024-12-28 ENCOUNTER — Ambulatory Visit

## 2025-01-11 ENCOUNTER — Ambulatory Visit: Admitting: Pulmonary Disease
